# Patient Record
Sex: Male | Born: 1945
Health system: Southern US, Community
[De-identification: ages and names within clinical notes are randomized; demographics above are authoritative.]

## PROBLEM LIST (undated history)

## (undated) DIAGNOSIS — Z951 Presence of aortocoronary bypass graft: Secondary | ICD-10-CM

## (undated) DIAGNOSIS — G4733 Obstructive sleep apnea (adult) (pediatric): Secondary | ICD-10-CM

## (undated) DIAGNOSIS — I5020 Unspecified systolic (congestive) heart failure: Secondary | ICD-10-CM

## (undated) DIAGNOSIS — I2510 Atherosclerotic heart disease of native coronary artery without angina pectoris: Secondary | ICD-10-CM

## (undated) DIAGNOSIS — E785 Hyperlipidemia, unspecified: Secondary | ICD-10-CM

## (undated) DIAGNOSIS — E119 Type 2 diabetes mellitus without complications: Secondary | ICD-10-CM

## (undated) DIAGNOSIS — I10 Essential (primary) hypertension: Secondary | ICD-10-CM

## (undated) DIAGNOSIS — I491 Atrial premature depolarization: Secondary | ICD-10-CM

## (undated) DIAGNOSIS — Z87891 Personal history of nicotine dependence: Secondary | ICD-10-CM

## (undated) HISTORY — DX: Atherosclerotic heart disease of native coronary artery without angina pectoris: I25.10

## (undated) HISTORY — PX: CARDIAC CATHETERIZATION: SHX172

## (undated) HISTORY — DX: Unspecified systolic (congestive) heart failure: I50.20

## (undated) HISTORY — DX: Personal history of nicotine dependence: Z87.891

## (undated) HISTORY — PX: TONSILLECTOMY: SUR1361

## (undated) HISTORY — DX: Atrial premature depolarization: I49.1

## (undated) HISTORY — PX: VASECTOMY: SHX75

## (undated) HISTORY — DX: Presence of aortocoronary bypass graft: Z95.1

---

## 2017-11-19 DIAGNOSIS — R011 Cardiac murmur, unspecified: Secondary | ICD-10-CM | POA: Diagnosis not present

## 2017-11-19 DIAGNOSIS — Z23 Encounter for immunization: Secondary | ICD-10-CM | POA: Diagnosis not present

## 2017-11-19 DIAGNOSIS — I10 Essential (primary) hypertension: Secondary | ICD-10-CM | POA: Diagnosis not present

## 2017-11-19 DIAGNOSIS — M109 Gout, unspecified: Secondary | ICD-10-CM | POA: Diagnosis not present

## 2017-11-19 DIAGNOSIS — K219 Gastro-esophageal reflux disease without esophagitis: Secondary | ICD-10-CM | POA: Diagnosis not present

## 2017-12-10 DIAGNOSIS — G4733 Obstructive sleep apnea (adult) (pediatric): Secondary | ICD-10-CM | POA: Diagnosis not present

## 2018-01-10 DIAGNOSIS — R9431 Abnormal electrocardiogram [ECG] [EKG]: Secondary | ICD-10-CM | POA: Diagnosis not present

## 2018-01-10 DIAGNOSIS — I10 Essential (primary) hypertension: Secondary | ICD-10-CM | POA: Diagnosis not present

## 2018-01-10 DIAGNOSIS — R011 Cardiac murmur, unspecified: Secondary | ICD-10-CM | POA: Diagnosis not present

## 2018-01-10 DIAGNOSIS — E785 Hyperlipidemia, unspecified: Secondary | ICD-10-CM | POA: Diagnosis not present

## 2018-01-14 DIAGNOSIS — E782 Mixed hyperlipidemia: Secondary | ICD-10-CM | POA: Diagnosis not present

## 2018-01-14 DIAGNOSIS — I10 Essential (primary) hypertension: Secondary | ICD-10-CM | POA: Diagnosis not present

## 2018-01-14 DIAGNOSIS — R010 Benign and innocent cardiac murmurs: Secondary | ICD-10-CM | POA: Diagnosis not present

## 2018-01-14 DIAGNOSIS — R9431 Abnormal electrocardiogram [ECG] [EKG]: Secondary | ICD-10-CM | POA: Diagnosis not present

## 2018-01-14 DIAGNOSIS — I2510 Atherosclerotic heart disease of native coronary artery without angina pectoris: Secondary | ICD-10-CM | POA: Diagnosis not present

## 2018-01-14 DIAGNOSIS — R079 Chest pain, unspecified: Secondary | ICD-10-CM | POA: Diagnosis not present

## 2018-01-18 DIAGNOSIS — R079 Chest pain, unspecified: Secondary | ICD-10-CM | POA: Diagnosis not present

## 2018-01-18 DIAGNOSIS — I10 Essential (primary) hypertension: Secondary | ICD-10-CM | POA: Diagnosis not present

## 2018-01-18 DIAGNOSIS — R9431 Abnormal electrocardiogram [ECG] [EKG]: Secondary | ICD-10-CM | POA: Diagnosis not present

## 2018-01-18 DIAGNOSIS — R010 Benign and innocent cardiac murmurs: Secondary | ICD-10-CM | POA: Diagnosis not present

## 2018-01-25 DIAGNOSIS — R9431 Abnormal electrocardiogram [ECG] [EKG]: Secondary | ICD-10-CM | POA: Diagnosis not present

## 2018-01-28 DIAGNOSIS — I10 Essential (primary) hypertension: Secondary | ICD-10-CM | POA: Diagnosis not present

## 2018-01-28 DIAGNOSIS — I2510 Atherosclerotic heart disease of native coronary artery without angina pectoris: Secondary | ICD-10-CM | POA: Diagnosis not present

## 2018-01-28 DIAGNOSIS — R0602 Shortness of breath: Secondary | ICD-10-CM | POA: Diagnosis not present

## 2018-01-28 DIAGNOSIS — R079 Chest pain, unspecified: Secondary | ICD-10-CM | POA: Diagnosis not present

## 2018-03-31 DIAGNOSIS — I2510 Atherosclerotic heart disease of native coronary artery without angina pectoris: Secondary | ICD-10-CM | POA: Diagnosis not present

## 2018-03-31 DIAGNOSIS — I10 Essential (primary) hypertension: Secondary | ICD-10-CM | POA: Diagnosis not present

## 2018-03-31 DIAGNOSIS — E785 Hyperlipidemia, unspecified: Secondary | ICD-10-CM | POA: Diagnosis not present

## 2018-05-19 DIAGNOSIS — Z23 Encounter for immunization: Secondary | ICD-10-CM | POA: Diagnosis not present

## 2018-05-19 DIAGNOSIS — I10 Essential (primary) hypertension: Secondary | ICD-10-CM | POA: Diagnosis not present

## 2018-05-19 DIAGNOSIS — E785 Hyperlipidemia, unspecified: Secondary | ICD-10-CM | POA: Diagnosis not present

## 2018-05-19 DIAGNOSIS — K219 Gastro-esophageal reflux disease without esophagitis: Secondary | ICD-10-CM | POA: Diagnosis not present

## 2018-07-04 DIAGNOSIS — I209 Angina pectoris, unspecified: Secondary | ICD-10-CM | POA: Diagnosis not present

## 2018-07-04 DIAGNOSIS — I10 Essential (primary) hypertension: Secondary | ICD-10-CM | POA: Diagnosis not present

## 2018-08-22 DIAGNOSIS — I10 Essential (primary) hypertension: Secondary | ICD-10-CM | POA: Diagnosis not present

## 2018-08-22 DIAGNOSIS — Z1389 Encounter for screening for other disorder: Secondary | ICD-10-CM | POA: Diagnosis not present

## 2018-08-22 DIAGNOSIS — I259 Chronic ischemic heart disease, unspecified: Secondary | ICD-10-CM | POA: Diagnosis not present

## 2018-11-07 DIAGNOSIS — E785 Hyperlipidemia, unspecified: Secondary | ICD-10-CM | POA: Diagnosis not present

## 2018-11-07 DIAGNOSIS — I10 Essential (primary) hypertension: Secondary | ICD-10-CM | POA: Diagnosis not present

## 2018-11-07 DIAGNOSIS — M109 Gout, unspecified: Secondary | ICD-10-CM | POA: Diagnosis not present

## 2018-12-30 DIAGNOSIS — G4733 Obstructive sleep apnea (adult) (pediatric): Secondary | ICD-10-CM | POA: Diagnosis not present

## 2019-02-27 DIAGNOSIS — I10 Essential (primary) hypertension: Secondary | ICD-10-CM | POA: Diagnosis not present

## 2019-02-27 DIAGNOSIS — I259 Chronic ischemic heart disease, unspecified: Secondary | ICD-10-CM | POA: Diagnosis not present

## 2019-03-13 DIAGNOSIS — M545 Low back pain: Secondary | ICD-10-CM | POA: Diagnosis not present

## 2019-03-27 DIAGNOSIS — M48061 Spinal stenosis, lumbar region without neurogenic claudication: Secondary | ICD-10-CM | POA: Diagnosis not present

## 2019-03-27 DIAGNOSIS — M545 Low back pain: Secondary | ICD-10-CM | POA: Diagnosis not present

## 2019-05-05 DIAGNOSIS — I10 Essential (primary) hypertension: Secondary | ICD-10-CM | POA: Diagnosis not present

## 2019-05-05 DIAGNOSIS — I2510 Atherosclerotic heart disease of native coronary artery without angina pectoris: Secondary | ICD-10-CM | POA: Diagnosis not present

## 2019-05-05 DIAGNOSIS — R7303 Prediabetes: Secondary | ICD-10-CM | POA: Diagnosis not present

## 2019-05-05 DIAGNOSIS — E785 Hyperlipidemia, unspecified: Secondary | ICD-10-CM | POA: Diagnosis not present

## 2019-05-16 DIAGNOSIS — M160 Bilateral primary osteoarthritis of hip: Secondary | ICD-10-CM | POA: Diagnosis not present

## 2019-05-16 DIAGNOSIS — M109 Gout, unspecified: Secondary | ICD-10-CM | POA: Diagnosis not present

## 2019-05-16 DIAGNOSIS — Z23 Encounter for immunization: Secondary | ICD-10-CM | POA: Diagnosis not present

## 2019-05-16 DIAGNOSIS — M545 Low back pain: Secondary | ICD-10-CM | POA: Diagnosis not present

## 2019-05-16 DIAGNOSIS — I10 Essential (primary) hypertension: Secondary | ICD-10-CM | POA: Diagnosis not present

## 2019-06-24 DIAGNOSIS — Z20828 Contact with and (suspected) exposure to other viral communicable diseases: Secondary | ICD-10-CM | POA: Diagnosis not present

## 2019-06-27 DIAGNOSIS — H25813 Combined forms of age-related cataract, bilateral: Secondary | ICD-10-CM | POA: Diagnosis not present

## 2019-06-30 ENCOUNTER — Emergency Department: Payer: No Typology Code available for payment source

## 2019-06-30 ENCOUNTER — Other Ambulatory Visit: Payer: Self-pay

## 2019-06-30 ENCOUNTER — Telehealth: Payer: Self-pay | Admitting: Emergency Medicine

## 2019-06-30 ENCOUNTER — Encounter: Payer: Self-pay | Admitting: Emergency Medicine

## 2019-06-30 ENCOUNTER — Emergency Department
Admission: EM | Admit: 2019-06-30 | Discharge: 2019-06-30 | Disposition: A | Discharge status: Home / Self Care | Payer: No Typology Code available for payment source | Attending: Emergency Medicine | Admitting: Emergency Medicine

## 2019-06-30 DIAGNOSIS — J069 Acute upper respiratory infection, unspecified: Secondary | ICD-10-CM

## 2019-06-30 DIAGNOSIS — Z79899 Other long term (current) drug therapy: Secondary | ICD-10-CM | POA: Diagnosis not present

## 2019-06-30 DIAGNOSIS — R0789 Other chest pain: Secondary | ICD-10-CM

## 2019-06-30 DIAGNOSIS — U071 COVID-19: Secondary | ICD-10-CM | POA: Insufficient documentation

## 2019-06-30 DIAGNOSIS — R05 Cough: Secondary | ICD-10-CM | POA: Diagnosis not present

## 2019-06-30 LAB — SARS CORONAVIRUS 2 (TAT 6-24 HRS): SARS Coronavirus 2: POSITIVE — AB

## 2019-06-30 MED ORDER — BENZONATATE 100 MG PO CAPS: 200.0000 mg | ORAL_CAPSULE | Freq: Three times a day (TID) | ORAL | 0 refills | Status: DC | PRN

## 2019-06-30 MED ORDER — CYCLOBENZAPRINE HCL 5 MG PO TABS: 5.0000 mg | ORAL_TABLET | Freq: Three times a day (TID) | ORAL | 0 refills | Status: AC | PRN

## 2019-06-30 NOTE — ED Triage Notes (Signed)
Patient ambulatory to triage with steady gait, without difficulty or distress noted, mask in place; pt reports since Wed has had nonprod cough, low-grade temp (99) and bodyaches

## 2019-06-30 NOTE — ED Notes (Signed)
See triage note  Presents with  Some body aches with some nausea and dry heaves  States temp at home was 99.6  But is afebrile on arrival

## 2019-06-30 NOTE — ED Provider Notes (Signed)
[facility] Emergency Department Provider Note   ____________________________________________   First MD Initiated Contact with Patient 06/30/19 0715     (approximate)  I have reviewed the triage vital signs and the nursing notes.   HISTORY  Chief Complaint Cough    HPI Brian Chapman is a 73 y.o. male patient presents with nonproductive cough for 3 days.  Patient also state low-grade fever and body aches.  Patient denies sore throat, nasal congestion, or runny nose.  Patient state nausea but no vomiting.  Patient denies recent travel or known exposure to COVID-19.  Patient describes his pain as "achy".  No palliative measure for complaint.         History reviewed. No pertinent past medical history.  There are no active problems to display for this patient.   Past Surgical History:  Procedure Laterality Date  . TONSILLECTOMY    . VASECTOMY      Prior to Admission medications   Medication Sig Start Date End Date Taking? Authorizing Provider  allopurinol (ZYLOPRIM) 300 MG tablet Take 300 mg by mouth daily.   Yes [provider]  benzonatate (TESSALON PERLES) 100 MG capsule Take 2 capsules (200 mg total) by mouth 3 (three) times daily as needed. 06/30/19 06/29/20  Joni Reining, PA-C  cyclobenzaprine (FLEXERIL) 5 MG tablet Take 1 tablet (5 mg total) by mouth 3 (three) times daily as needed for muscle spasms. 06/30/19   Joni Reining, PA-C    Allergies Patient has no known allergies.  No family history on file.  Social History Social History   Tobacco Use  . Smoking status: Never Smoker  . Smokeless tobacco: Never Used  Substance Use Topics  . Alcohol use: Never    Frequency: Never  . Drug use: Not on file    Review of Systems Constitutional: Fever and body aches.   Eyes: No visual changes. ENT: No sore throat. Cardiovascular: Denies chest pain. Respiratory: Denies shortness of breath.  Nonproductive cough  Gastrointestinal: No abdominal pain.  Nausea without vomiting.  No diarrhea.  No constipation. Genitourinary: Negative for dysuria. Musculoskeletal: Negative for back pain. Skin: Negative for rash. Neurological: Negative for headaches, focal weakness or numbness.   ____________________________________________   PHYSICAL EXAM:  VITAL SIGNS: ED Triage Vitals  Enc Vitals Group     BP 06/30/19 0631 (!) 178/78     Pulse Rate 06/30/19 0631 100     Resp 06/30/19 0631 20     Temp 06/30/19 0631 98.6 F (37 C)     Temp Source 06/30/19 0631 Oral     SpO2 06/30/19 0631 95 %     Weight 06/30/19 0630 225 lb (102.1 kg)     Height 06/30/19 0630 5\' 6"  (1.676 m)     Head Circumference --      Peak Flow --      Pain Score 06/30/19 0630 5     Pain Loc --      Pain Edu? --      Excl. in GC? --    Constitutional: Alert and oriented. Well appearing and in no acute distress. Eyes: Conjunctivae are normal. PERRL. EOMI. Head: Atraumatic. Nose: No congestion/rhinnorhea. Mouth/Throat: Mucous membranes are moist.  Oropharynx non-erythematous. Neck: No stridor.  No cervical spine tenderness to palpation. Hematological/Lymphatic/Immunilogical: No cervical lymphadenopathy. Cardiovascular: Normal rate, regular rhythm. Grossly normal heart sounds.  Good peripheral circulation. Respiratory: Normal respiratory effort.  No retractions. Lungs CTAB. Musculoskeletal: No lower extremity tenderness nor edema.  No joint effusions. Neurologic:  Normal speech and language. No gross focal neurologic deficits are appreciated. No gait instability. Skin:  Skin is warm, dry and intact. No rash noted. Psychiatric: Mood and affect are normal. Speech and behavior are normal.  ____________________________________________   LABS (all labs ordered are listed, but only abnormal results are displayed)  Labs Reviewed  SARS CORONAVIRUS 2 (TAT 6-24 HRS)   ____________________________________________  EKG    ____________________________________________  RADIOLOGY  ED MD interpretation:    Official radiology report(s): Dg Chest Portable 1 View  Result Date: 06/30/2019 CLINICAL DATA:  Nonproductive cough, low-grade fever, and fatigue for several days. EXAM: PORTABLE CHEST 1 VIEW COMPARISON:  None. FINDINGS: The heart size and mediastinal contours are within normal limits. Ectasia of thoracic aorta noted. Both lungs are clear. The visualized skeletal structures are unremarkable. IMPRESSION: No active disease. Electronically Signed   By: Marlaine Hind M.D.   On: 06/30/2019 07:55    ____________________________________________   PROCEDURES  Procedure(s) performed (including Critical Care):  Procedures   ____________________________________________   INITIAL IMPRESSION / ASSESSMENT AND PLAN / ED COURSE  As part of my medical decision making, I reviewed the following data within the [place]     Patient presents with 3 days of cough, low-grade fever, and body aches.  Patient physical exam consistent with viral respiratory infection with cough.  Discussed negative chest x-ray findings with patient.  Patient given discharge care instruction advised take medication as directed.  Follow-up PCP.    Brian Chapman was evaluated in Emergency Department on 06/30/2019 for the symptoms described in the history of present illness. He was evaluated in the context of the global COVID-19 pandemic, which necessitated consideration that the patient might be at risk for infection with the SARS-CoV-2 virus that causes COVID-19. Institutional protocols and algorithms that pertain to the evaluation of patients at risk for COVID-19 are in a state of rapid change based on information released by regulatory bodies including the CDC and federal and state organizations. These policies and algorithms were followed during the patient's care in the ED.        ____________________________________________   FINAL CLINICAL IMPRESSION(S) / ED DIAGNOSES  Final diagnoses:  Viral URI with cough  Chest wall pain     ED Discharge Orders         Ordered    benzonatate (TESSALON PERLES) 100 MG capsule  3 times daily PRN     06/30/19 0806    cyclobenzaprine (FLEXERIL) 5 MG tablet  3 times daily PRN     06/30/19 1093           Note:  This document was prepared using Dragon voice recognition software and may include unintentional dictation errors.    Sable Feil, PA-C 06/30/19 2355    Lavonia Drafts, MD 06/30/19 [phone]

## 2019-07-27 DIAGNOSIS — G4733 Obstructive sleep apnea (adult) (pediatric): Secondary | ICD-10-CM | POA: Diagnosis not present

## 2019-08-15 DIAGNOSIS — G4733 Obstructive sleep apnea (adult) (pediatric): Secondary | ICD-10-CM | POA: Diagnosis not present

## 2019-08-27 DIAGNOSIS — G4733 Obstructive sleep apnea (adult) (pediatric): Secondary | ICD-10-CM | POA: Diagnosis not present

## 2019-08-31 DIAGNOSIS — M19031 Primary osteoarthritis, right wrist: Secondary | ICD-10-CM | POA: Diagnosis not present

## 2019-08-31 DIAGNOSIS — I2510 Atherosclerotic heart disease of native coronary artery without angina pectoris: Secondary | ICD-10-CM | POA: Diagnosis not present

## 2019-08-31 DIAGNOSIS — M79644 Pain in right finger(s): Secondary | ICD-10-CM | POA: Diagnosis not present

## 2019-08-31 DIAGNOSIS — M189 Osteoarthritis of first carpometacarpal joint, unspecified: Secondary | ICD-10-CM | POA: Diagnosis not present

## 2019-08-31 DIAGNOSIS — R7303 Prediabetes: Secondary | ICD-10-CM | POA: Diagnosis not present

## 2019-08-31 DIAGNOSIS — I10 Essential (primary) hypertension: Secondary | ICD-10-CM | POA: Diagnosis not present

## 2019-09-16 ENCOUNTER — Other Ambulatory Visit: Payer: Self-pay

## 2019-09-16 ENCOUNTER — Inpatient Hospital Stay (HOSPITAL_COMMUNITY): Payer: No Typology Code available for payment source

## 2019-09-16 ENCOUNTER — Encounter: Payer: Self-pay | Admitting: Intensive Care

## 2019-09-16 ENCOUNTER — Emergency Department: Payer: No Typology Code available for payment source

## 2019-09-16 ENCOUNTER — Inpatient Hospital Stay
Admission: EM | Admit: 2019-09-16 | Discharge: 2019-09-16 | DRG: 282 | Disposition: A | Discharge status: Other Acute Inpatient Hospital | Payer: No Typology Code available for payment source | Attending: Emergency Medicine | Admitting: Emergency Medicine

## 2019-09-16 ENCOUNTER — Encounter
Admission: EM | Disposition: A | Discharge status: Other Acute Inpatient Hospital | Payer: Self-pay | Source: Home / Self Care

## 2019-09-16 ENCOUNTER — Inpatient Hospital Stay (HOSPITAL_COMMUNITY)
Admission: AD | Admit: 2019-09-16 | Discharge: 2019-09-21 | DRG: 234 | Disposition: A | Discharge status: Home / Self Care | Payer: No Typology Code available for payment source | Source: Other Acute Inpatient Hospital | Attending: Cardiothoracic Surgery | Admitting: Cardiothoracic Surgery

## 2019-09-16 DIAGNOSIS — K760 Fatty (change of) liver, not elsewhere classified: Secondary | ICD-10-CM | POA: Diagnosis not present

## 2019-09-16 DIAGNOSIS — Z9852 Vasectomy status: Secondary | ICD-10-CM | POA: Diagnosis not present

## 2019-09-16 DIAGNOSIS — I2510 Atherosclerotic heart disease of native coronary artery without angina pectoris: Secondary | ICD-10-CM | POA: Diagnosis present

## 2019-09-16 DIAGNOSIS — Z452 Encounter for adjustment and management of vascular access device: Secondary | ICD-10-CM | POA: Diagnosis not present

## 2019-09-16 DIAGNOSIS — Z7984 Long term (current) use of oral hypoglycemic drugs: Secondary | ICD-10-CM

## 2019-09-16 DIAGNOSIS — Z20822 Contact with and (suspected) exposure to covid-19: Secondary | ICD-10-CM | POA: Diagnosis not present

## 2019-09-16 DIAGNOSIS — Z888 Allergy status to other drugs, medicaments and biological substances status: Secondary | ICD-10-CM | POA: Insufficient documentation

## 2019-09-16 DIAGNOSIS — I10 Essential (primary) hypertension: Secondary | ICD-10-CM | POA: Diagnosis present

## 2019-09-16 DIAGNOSIS — I213 ST elevation (STEMI) myocardial infarction of unspecified site: Secondary | ICD-10-CM

## 2019-09-16 DIAGNOSIS — E785 Hyperlipidemia, unspecified: Secondary | ICD-10-CM | POA: Diagnosis present

## 2019-09-16 DIAGNOSIS — E119 Type 2 diabetes mellitus without complications: Secondary | ICD-10-CM | POA: Diagnosis present

## 2019-09-16 DIAGNOSIS — Z6837 Body mass index (BMI) 37.0-37.9, adult: Secondary | ICD-10-CM

## 2019-09-16 DIAGNOSIS — Z87891 Personal history of nicotine dependence: Secondary | ICD-10-CM | POA: Diagnosis not present

## 2019-09-16 DIAGNOSIS — I4891 Unspecified atrial fibrillation: Secondary | ICD-10-CM | POA: Diagnosis not present

## 2019-09-16 DIAGNOSIS — Z951 Presence of aortocoronary bypass graft: Secondary | ICD-10-CM

## 2019-09-16 DIAGNOSIS — Z9889 Other specified postprocedural states: Secondary | ICD-10-CM

## 2019-09-16 DIAGNOSIS — I2102 ST elevation (STEMI) myocardial infarction involving left anterior descending coronary artery: Principal | ICD-10-CM | POA: Diagnosis present

## 2019-09-16 DIAGNOSIS — I700 Atherosclerosis of aorta: Secondary | ICD-10-CM | POA: Diagnosis not present

## 2019-09-16 DIAGNOSIS — Z01810 Encounter for preprocedural cardiovascular examination: Secondary | ICD-10-CM | POA: Diagnosis not present

## 2019-09-16 DIAGNOSIS — Z7982 Long term (current) use of aspirin: Secondary | ICD-10-CM

## 2019-09-16 DIAGNOSIS — Z4682 Encounter for fitting and adjustment of non-vascular catheter: Secondary | ICD-10-CM | POA: Diagnosis not present

## 2019-09-16 DIAGNOSIS — Z79899 Other long term (current) drug therapy: Secondary | ICD-10-CM

## 2019-09-16 DIAGNOSIS — J9811 Atelectasis: Secondary | ICD-10-CM | POA: Diagnosis not present

## 2019-09-16 DIAGNOSIS — I219 Acute myocardial infarction, unspecified: Secondary | ICD-10-CM | POA: Diagnosis present

## 2019-09-16 DIAGNOSIS — I25110 Atherosclerotic heart disease of native coronary artery with unstable angina pectoris: Secondary | ICD-10-CM | POA: Diagnosis not present

## 2019-09-16 DIAGNOSIS — R918 Other nonspecific abnormal finding of lung field: Secondary | ICD-10-CM | POA: Diagnosis not present

## 2019-09-16 HISTORY — DX: Hyperlipidemia, unspecified: E78.5

## 2019-09-16 HISTORY — DX: Obstructive sleep apnea (adult) (pediatric): G47.33

## 2019-09-16 HISTORY — PX: CORONARY/GRAFT ACUTE MI REVASCULARIZATION: CATH118305

## 2019-09-16 HISTORY — DX: Type 2 diabetes mellitus without complications: E11.9

## 2019-09-16 HISTORY — PX: LEFT HEART CATH AND CORONARY ANGIOGRAPHY: CATH118249

## 2019-09-16 HISTORY — DX: Essential (primary) hypertension: I10

## 2019-09-16 LAB — APTT: aPTT: 33 seconds (ref 24–36)

## 2019-09-16 LAB — ABO/RH: ABO/RH(D): O NEG

## 2019-09-16 LAB — CBC
HCT: 48.5 % (ref 39.0–52.0)
Hemoglobin: 15.8 g/dL (ref 13.0–17.0)
MCH: 29.3 pg (ref 26.0–34.0)
MCHC: 32.6 g/dL (ref 30.0–36.0)
MCV: 90 fL (ref 80.0–100.0)
Platelets: 252 10*3/uL (ref 150–400)
RBC: 5.39 MIL/uL (ref 4.22–5.81)
RDW: 14.2 % (ref 11.5–15.5)
WBC: 10.4 10*3/uL (ref 4.0–10.5)
nRBC: 0 % (ref 0.0–0.2)

## 2019-09-16 LAB — TROPONIN I (HIGH SENSITIVITY): Troponin I (High Sensitivity): 16 ng/L (ref <18)

## 2019-09-16 LAB — HEPARIN LEVEL (UNFRACTIONATED): Heparin Unfractionated: 0.46 IU/mL (ref 0.30–0.70)

## 2019-09-16 LAB — RESPIRATORY PANEL BY RT PCR (FLU A&B, COVID)
Influenza A by PCR: NEGATIVE
Influenza B by PCR: NEGATIVE
SARS Coronavirus 2 by RT PCR: NEGATIVE

## 2019-09-16 LAB — BASIC METABOLIC PANEL
Anion gap: 10 (ref 5–15)
BUN: 24 mg/dL — ABNORMAL HIGH (ref 8–23)
CO2: 24 mmol/L (ref 22–32)
Calcium: 9 mg/dL (ref 8.9–10.3)
Chloride: 105 mmol/L (ref 98–111)
Creatinine, Ser: 1.03 mg/dL (ref 0.61–1.24)
GFR calc Af Amer: >60 mL/min (ref >60)
GFR calc non Af Amer: >60 mL/min (ref >60)
Glucose, Bld: 140 mg/dL — ABNORMAL HIGH (ref 70–99)
Potassium: 4.3 mmol/L (ref 3.5–5.1)
Sodium: 139 mmol/L (ref 135–145)

## 2019-09-16 LAB — ECHOCARDIOGRAM COMPLETE
Height: 66 in
Weight: 3287.5 oz

## 2019-09-16 LAB — GLUCOSE, CAPILLARY: Glucose-Capillary: 132 mg/dL — ABNORMAL HIGH (ref 70–99)

## 2019-09-16 LAB — POCT ACTIVATED CLOTTING TIME: Activated Clotting Time: 312 seconds

## 2019-09-16 LAB — PROTIME-INR
INR: 1 (ref 0.8–1.2)
Prothrombin Time: 13.2 seconds (ref 11.4–15.2)

## 2019-09-16 LAB — MRSA PCR SCREENING: MRSA by PCR: NEGATIVE

## 2019-09-16 LAB — TYPE AND SCREEN
ABO/RH(D): O NEG
Antibody Screen: NEGATIVE

## 2019-09-16 SURGERY — CORONARY/GRAFT ACUTE MI REVASCULARIZATION
Anesthesia: Moderate Sedation

## 2019-09-16 MED ORDER — PERFLUTREN LIPID MICROSPHERE
INTRAVENOUS | Status: AC
  Filled 2019-09-16: qty 10

## 2019-09-16 MED ORDER — HEPARIN BOLUS VIA INFUSION
4000.0000 [IU] | Freq: Once | INTRAVENOUS | Status: AC
  Administered 2019-09-16: 4000 [IU] via INTRAVENOUS
  Filled 2019-09-16: qty 4000

## 2019-09-16 MED ORDER — ONDANSETRON HCL 4 MG/2ML IJ SOLN
4.0000 mg | Freq: Four times a day (QID) | INTRAMUSCULAR | Status: DC | PRN
  Administered 2019-09-18 – 2019-09-19 (×3): 4 mg via INTRAVENOUS
  Filled 2019-09-16 (×3): qty 2

## 2019-09-16 MED ORDER — MAGNESIUM SULFATE 50 % IJ SOLN
40.0000 meq | INTRAMUSCULAR | Status: DC
  Filled 2019-09-16: qty 9.85

## 2019-09-16 MED ORDER — NOREPINEPHRINE 4 MG/250ML-% IV SOLN
0.0000 ug/min | INTRAVENOUS | Status: DC
  Filled 2019-09-16: qty 250

## 2019-09-16 MED ORDER — FAMOTIDINE IN NACL 20-0.9 MG/50ML-% IV SOLN
20.0000 mg | Freq: Two times a day (BID) | INTRAVENOUS | Status: DC
  Administered 2019-09-16: 21:00:00 20 mg via INTRAVENOUS
  Filled 2019-09-16: qty 50

## 2019-09-16 MED ORDER — CHLORHEXIDINE GLUCONATE CLOTH 2 % EX PADS
6.0000 | MEDICATED_PAD | Freq: Once | CUTANEOUS | Status: AC
  Administered 2019-09-16: 20:00:00 6 via TOPICAL

## 2019-09-16 MED ORDER — CHLORHEXIDINE GLUCONATE CLOTH 2 % EX PADS
6.0000 | MEDICATED_PAD | Freq: Once | CUTANEOUS | Status: AC
  Administered 2019-09-17: 06:00:00 6 via TOPICAL

## 2019-09-16 MED ORDER — MIDAZOLAM HCL 2 MG/2ML IJ SOLN
INTRAMUSCULAR | Status: DC | PRN
  Administered 2019-09-16 (×2): 1 mg via INTRAVENOUS

## 2019-09-16 MED ORDER — TRANEXAMIC ACID (OHS) PUMP PRIME SOLUTION
2.0000 mg/kg | INTRAVENOUS | Status: DC
  Filled 2019-09-16: qty 1.86

## 2019-09-16 MED ORDER — SODIUM CHLORIDE 0.9 % IV SOLN
INTRAVENOUS | Status: DC
  Filled 2019-09-16: qty 30

## 2019-09-16 MED ORDER — VANCOMYCIN HCL 1500 MG/300ML IV SOLN
1500.0000 mg | INTRAVENOUS | Status: DC
  Filled 2019-09-16: qty 300

## 2019-09-16 MED ORDER — DEXMEDETOMIDINE HCL IN NACL 400 MCG/100ML IV SOLN
0.1000 ug/kg/h | INTRAVENOUS | Status: AC
  Administered 2019-09-17: .3 ug/kg/h via INTRAVENOUS
  Filled 2019-09-16: qty 100

## 2019-09-16 MED ORDER — PERFLUTREN LIPID MICROSPHERE
1.0000 mL | INTRAVENOUS | Status: AC | PRN
  Administered 2019-09-16: 16:00:00 3 mL via INTRAVENOUS
  Filled 2019-09-16: qty 10

## 2019-09-16 MED ORDER — ASPIRIN 81 MG PO CHEW
324.0000 mg | CHEWABLE_TABLET | Freq: Once | ORAL | Status: AC
  Administered 2019-09-16: 324 mg via ORAL

## 2019-09-16 MED ORDER — CHLORHEXIDINE GLUCONATE CLOTH 2 % EX PADS: 6.0000 | MEDICATED_PAD | Freq: Once | CUTANEOUS | Status: DC

## 2019-09-16 MED ORDER — VERAPAMIL HCL 2.5 MG/ML IV SOLN
INTRAVENOUS | Status: AC
  Filled 2019-09-16: qty 2

## 2019-09-16 MED ORDER — HEPARIN (PORCINE) 25000 UT/250ML-% IV SOLN
INTRAVENOUS | Status: AC | PRN
  Administered 2019-09-16: 1000 [IU]/h via INTRAVENOUS

## 2019-09-16 MED ORDER — TEMAZEPAM 15 MG PO CAPS: 15.0000 mg | ORAL_CAPSULE | Freq: Once | ORAL | Status: DC | PRN

## 2019-09-16 MED ORDER — IPRATROPIUM-ALBUTEROL 0.5-2.5 (3) MG/3ML IN SOLN: 3.0000 mL | RESPIRATORY_TRACT | Status: DC | PRN

## 2019-09-16 MED ORDER — MILRINONE LACTATE IN DEXTROSE 20-5 MG/100ML-% IV SOLN
0.3000 ug/kg/min | INTRAVENOUS | Status: DC
  Filled 2019-09-16: qty 100

## 2019-09-16 MED ORDER — NITROGLYCERIN 1 MG/10 ML FOR IR/CATH LAB
INTRA_ARTERIAL | Status: AC
  Filled 2019-09-16: qty 10

## 2019-09-16 MED ORDER — VANCOMYCIN HCL 1000 MG IV SOLR
INTRAVENOUS | Status: DC
  Filled 2019-09-16: qty 1000

## 2019-09-16 MED ORDER — HEPARIN SODIUM (PORCINE) 1000 UNIT/ML IJ SOLN
INTRAMUSCULAR | Status: AC
  Filled 2019-09-16: qty 1

## 2019-09-16 MED ORDER — TRANEXAMIC ACID (OHS) BOLUS VIA INFUSION
15.0000 mg/kg | INTRAVENOUS | Status: AC
  Administered 2019-09-17: 1398 mg via INTRAVENOUS
  Filled 2019-09-16: qty 1398

## 2019-09-16 MED ORDER — POTASSIUM CHLORIDE 2 MEQ/ML IV SOLN
80.0000 meq | INTRAVENOUS | Status: DC
  Filled 2019-09-16: qty 40

## 2019-09-16 MED ORDER — EPINEPHRINE PF 1 MG/ML IJ SOLN
0.0000 ug/min | INTRAVENOUS | Status: DC
  Filled 2019-09-16: qty 4

## 2019-09-16 MED ORDER — NITROGLYCERIN IN D5W 200-5 MCG/ML-% IV SOLN
INTRAVENOUS | Status: AC
  Administered 2019-09-16: 5 ug/min via INTRAVENOUS
  Filled 2019-09-16: qty 250

## 2019-09-16 MED ORDER — CHLORHEXIDINE GLUCONATE 0.12 % MT SOLN
15.0000 mL | Freq: Once | OROMUCOSAL | Status: DC
  Filled 2019-09-16: qty 15

## 2019-09-16 MED ORDER — MIDAZOLAM HCL 2 MG/2ML IJ SOLN
INTRAMUSCULAR | Status: AC
  Filled 2019-09-16: qty 2

## 2019-09-16 MED ORDER — NITROGLYCERIN IN D5W 200-5 MCG/ML-% IV SOLN
0.0000 ug/min | INTRAVENOUS | Status: DC
  Administered 2019-09-16: 14:00:00 5 ug/min via INTRAVENOUS

## 2019-09-16 MED ORDER — HEPARIN (PORCINE) 25000 UT/250ML-% IV SOLN
1150.0000 [IU]/h | INTRAVENOUS | Status: DC
  Administered 2019-09-16: 1000 [IU]/h via INTRAVENOUS

## 2019-09-16 MED ORDER — VERAPAMIL HCL 2.5 MG/ML IV SOLN
INTRAVENOUS | Status: DC | PRN
  Administered 2019-09-16: 2.5 mg via INTRA_ARTERIAL

## 2019-09-16 MED ORDER — INSULIN REGULAR(HUMAN) IN NACL 100-0.9 UT/100ML-% IV SOLN
INTRAVENOUS | Status: AC
  Administered 2019-09-17: 1 [IU]/h via INTRAVENOUS
  Filled 2019-09-16: qty 100

## 2019-09-16 MED ORDER — LORAZEPAM 0.5 MG PO TABS: 0.5000 mg | ORAL_TABLET | ORAL | Status: DC | PRN

## 2019-09-16 MED ORDER — METOPROLOL TARTRATE 12.5 MG HALF TABLET
12.5000 mg | ORAL_TABLET | Freq: Once | ORAL | Status: AC
  Administered 2019-09-17: 12.5 mg via ORAL
  Filled 2019-09-16: qty 1

## 2019-09-16 MED ORDER — TRANEXAMIC ACID (OHS) BOLUS VIA INFUSION
15.0000 mg/kg | INTRAVENOUS | Status: DC
  Filled 2019-09-16: qty 1398

## 2019-09-16 MED ORDER — BISACODYL 5 MG PO TBEC
5.0000 mg | DELAYED_RELEASE_TABLET | Freq: Once | ORAL | Status: AC
  Administered 2019-09-16: 20:00:00 5 mg via ORAL
  Filled 2019-09-16: qty 1

## 2019-09-16 MED ORDER — MILRINONE LACTATE IN DEXTROSE 20-5 MG/100ML-% IV SOLN: 0.3000 ug/kg/min | INTRAVENOUS | Status: DC

## 2019-09-16 MED ORDER — NITROGLYCERIN IN D5W 200-5 MCG/ML-% IV SOLN: 2.0000 ug/min | INTRAVENOUS | Status: DC

## 2019-09-16 MED ORDER — PLASMA-LYTE 148 IV SOLN
INTRAVENOUS | Status: DC
  Filled 2019-09-16: qty 2.5

## 2019-09-16 MED ORDER — NITROGLYCERIN IN D5W 200-5 MCG/ML-% IV SOLN
2.0000 ug/min | INTRAVENOUS | Status: DC
  Filled 2019-09-16: qty 250

## 2019-09-16 MED ORDER — VANCOMYCIN HCL 1500 MG/300ML IV SOLN
1500.0000 mg | INTRAVENOUS | Status: AC
  Administered 2019-09-17: 1500 mg via INTRAVENOUS
  Filled 2019-09-16: qty 300

## 2019-09-16 MED ORDER — HEPARIN SODIUM (PORCINE) 1000 UNIT/ML IJ SOLN
INTRAMUSCULAR | Status: DC | PRN
  Administered 2019-09-16: 3000 [IU] via INTRAVENOUS
  Administered 2019-09-16: 7000 [IU] via INTRAVENOUS

## 2019-09-16 MED ORDER — ACETAMINOPHEN 325 MG PO TABS: 650.0000 mg | ORAL_TABLET | ORAL | Status: DC | PRN

## 2019-09-16 MED ORDER — PHENYLEPHRINE HCL-NACL 20-0.9 MG/250ML-% IV SOLN
30.0000 ug/min | INTRAVENOUS | Status: AC
  Administered 2019-09-17: 10 ug/min via INTRAVENOUS
  Filled 2019-09-16: qty 250

## 2019-09-16 MED ORDER — NOREPINEPHRINE 4 MG/250ML-% IV SOLN: 0.0000 ug/min | INTRAVENOUS | Status: DC

## 2019-09-16 MED ORDER — HEPARIN (PORCINE) 25000 UT/250ML-% IV SOLN
INTRAVENOUS | Status: AC
  Filled 2019-09-16: qty 250

## 2019-09-16 MED ORDER — NITROGLYCERIN 1 MG/10 ML FOR IR/CATH LAB
INTRA_ARTERIAL | Status: DC | PRN
  Administered 2019-09-16: 100 ug via INTRACORONARY

## 2019-09-16 MED ORDER — SODIUM CHLORIDE 0.9 % IV SOLN
1.5000 g | INTRAVENOUS | Status: AC
  Administered 2019-09-17: 1.5 g via INTRAVENOUS
  Filled 2019-09-16: qty 1.5

## 2019-09-16 MED ORDER — IOHEXOL 300 MG/ML  SOLN
INTRAMUSCULAR | Status: DC | PRN
  Administered 2019-09-16: 215 mL

## 2019-09-16 MED ORDER — NITROGLYCERIN 0.4 MG SL SUBL
0.4000 mg | SUBLINGUAL_TABLET | SUBLINGUAL | Status: DC | PRN
  Administered 2019-09-16: 0.4 mg via SUBLINGUAL

## 2019-09-16 MED ORDER — TRANEXAMIC ACID 1000 MG/10ML IV SOLN
1.5000 mg/kg/h | INTRAVENOUS | Status: AC
  Administered 2019-09-17: 1.5 mg/kg/h via INTRAVENOUS
  Filled 2019-09-16: qty 25

## 2019-09-16 MED ORDER — THIAMINE HCL 100 MG/ML IJ SOLN
Freq: Once | INTRAVENOUS | Status: AC
  Filled 2019-09-16: qty 1000

## 2019-09-16 MED ORDER — TRANEXAMIC ACID 1000 MG/10ML IV SOLN
1.5000 mg/kg/h | INTRAVENOUS | Status: DC
  Filled 2019-09-16: qty 25

## 2019-09-16 MED ORDER — DEXMEDETOMIDINE HCL IN NACL 400 MCG/100ML IV SOLN: 0.1000 ug/kg/h | INTRAVENOUS | Status: DC

## 2019-09-16 MED ORDER — HEPARIN (PORCINE) IN NACL 2000-0.9 UNIT/L-% IV SOLN
INTRAVENOUS | Status: DC | PRN
  Administered 2019-09-16: 1000 mL

## 2019-09-16 MED ORDER — HEPARIN (PORCINE) 25000 UT/250ML-% IV SOLN
900.0000 [IU]/h | INTRAVENOUS | Status: DC
  Administered 2019-09-16: 14:00:00 1150 [IU]/h via INTRAVENOUS
  Administered 2019-09-17: 03:00:00 950 [IU]/h via INTRAVENOUS
  Filled 2019-09-16: qty 250

## 2019-09-16 MED ORDER — FENTANYL CITRATE (PF) 100 MCG/2ML IJ SOLN
INTRAMUSCULAR | Status: DC | PRN
  Administered 2019-09-16: 50 ug via INTRAVENOUS
  Administered 2019-09-16: 25 ug via INTRAVENOUS

## 2019-09-16 MED ORDER — SODIUM CHLORIDE 0.9 % IV SOLN
750.0000 mg | INTRAVENOUS | Status: DC
  Filled 2019-09-16: qty 750

## 2019-09-16 MED ORDER — SODIUM CHLORIDE 0.9 % IV SOLN
1.5000 g | INTRAVENOUS | Status: DC
  Filled 2019-09-16: qty 1.5

## 2019-09-16 MED ORDER — SODIUM CHLORIDE 0.9 % IV SOLN
250.0000 mL | INTRAVENOUS | Status: DC
  Administered 2019-09-16: 250 mL via INTRAVENOUS

## 2019-09-16 MED ORDER — PHENYLEPHRINE HCL-NACL 20-0.9 MG/250ML-% IV SOLN: 30.0000 ug/min | INTRAVENOUS | Status: DC

## 2019-09-16 MED ORDER — SODIUM CHLORIDE 0.9 % IV SOLN
750.0000 mg | INTRAVENOUS | Status: AC
  Administered 2019-09-17: .75 g via INTRAVENOUS
  Filled 2019-09-16: qty 750

## 2019-09-16 MED ORDER — INSULIN REGULAR(HUMAN) IN NACL 100-0.9 UT/100ML-% IV SOLN: INTRAVENOUS | Status: DC

## 2019-09-16 MED ORDER — FENTANYL CITRATE (PF) 100 MCG/2ML IJ SOLN
INTRAMUSCULAR | Status: AC
  Filled 2019-09-16: qty 2

## 2019-09-16 SURGICAL SUPPLY — 18 items
BALLN IABP SENSA PLUS 8F 50CC (BALLOONS) ×3
BALLN MINITREK RX 2.0X15 (BALLOONS) ×3
BALLOON IABP SENS PLUS 8F 50CC (BALLOONS) ×1 IMPLANT
BALLOON MINITREK RX 2.0X15 (BALLOONS) ×1 IMPLANT
CANNULA 5F STIFF (CANNULA) ×3 IMPLANT
CATH INFINITI 5FR ANG PIGTAIL (CATHETERS) ×3 IMPLANT
CATH INFINITI JR4 5F (CATHETERS) ×3 IMPLANT
CATH LAUNCHER 6FR EBU3.5 (CATHETERS) ×3 IMPLANT
DEVICE INFLAT 30 PLUS (MISCELLANEOUS) ×3 IMPLANT
DEVICE RAD COMP TR BAND LRG (VASCULAR PRODUCTS) ×3 IMPLANT
GLIDESHEATH SLEND SS 6F .021 (SHEATH) ×3 IMPLANT
KIT MANI 3VAL PERCEP (MISCELLANEOUS) ×3 IMPLANT
KIT TRANSPAC II SGL 4260605 (MISCELLANEOUS) ×3 IMPLANT
PACK CARDIAC CATH (CUSTOM PROCEDURE TRAY) ×3 IMPLANT
SUT SILK 0 FSL (SUTURE) ×3 IMPLANT
WIRE HITORQ VERSACORE ST 145CM (WIRE) ×3 IMPLANT
WIRE ROSEN-J .035X260CM (WIRE) ×3 IMPLANT
WIRE RUNTHROUGH .014X180CM (WIRE) ×3 IMPLANT

## 2019-09-16 NOTE — ED Notes (Addendum)
Pt states that he knows he has a blockage in his heart that he is followed by the [place] for. Denies any stent placement yet. Pt states his mother passed away from "massive heart attack." pt states the pressure in his chest has released and now it just feels like a discomfort. Takes a baby asa at night, takes BP meds in the morning. States hasn't missed any meds. A&O, speaking in complete sentences. Doesn't appear as anxious as when pt first arrived.

## 2019-09-16 NOTE — Consult Note (Signed)
ANTICOAGULATION CONSULT NOTE - Initial Consult  Pharmacy Consult for heparin drip Indication: chest pain/ACS/STEMI  Allergies  Allergen Reactions  . Lisinopril Cough    Patient Measurements: Height: 5\' 6"  (167.6 cm) Weight: 225 lb (102.1 kg) IBW/kg (Calculated) : 63.8 Heparin Dosing Weight: 86.4kg  Vital Signs: Temp: 97.5 F (36.4 C) (02/06 0841) Temp Source: Oral (02/06 0841) BP: 137/94 (02/06 0856) Pulse Rate: 81 (02/06 0856)  Labs: No results for input(s): HGB, HCT, PLT, APTT, LABPROT, INR, HEPARINUNFRC, HEPRLOWMOCWT, CREATININE, CKTOTAL, CKMB, TROPONINIHS in the last 72 hours.  CrCl cannot be calculated (No successful lab value found.).   Medical History: Past Medical History:  Diagnosis Date  . Diabetes mellitus without complication (HCC)   . Hyperlipidemia   . Hypertension     Medications:  No PTA anticoagulant of record  Assessment: Pharmacy has been consulted to initiate and monitor heparin drip for ACS/STEMI in this 74 yo male with chespt pain.  Goal of Therapy:  Heparin level 0.3-0.7 units/ml Monitor platelets by anticoagulation protocol: Yes   Plan:  Give 4000 units bolus x 1, followed by 1050 units/hour  Will obtain baseline INR, APTT, and CBC labs.  Will f/u heparin level (HL) in 8 hours per protocol.   65, PharmD, BCPS Clinical Pharmacist 09/16/2019 9:06 AM

## 2019-09-16 NOTE — ED Notes (Signed)
Called [facility] activated [facility]  [phone]

## 2019-09-16 NOTE — ED Notes (Signed)
Pt clothes removed and pt placed in gown.  Pt placed on zoll by Lorin Picket EDT

## 2019-09-16 NOTE — ED Notes (Signed)
Cardiology at bedside.

## 2019-09-16 NOTE — ED Provider Notes (Signed)
[facility] Emergency Department Provider Note ____________________________________________   First MD Initiated Contact with Patient 09/16/19 [phone]     (approximate)  I have reviewed the triage vital signs and the nursing notes.   HISTORY  Chief Complaint Chest Pain    HPI Brian Chapman is a 74 y.o. male with PMH as noted below as well as a history of CAD diagnosed by stress test and echocardiogram (patient has no prior history of MI, stents, or catheterization) who presents with chest pain, acute onset 1 hour ago described as pressure or something sitting on his chest, and associated with some lightheadedness.  He denies nausea or vomiting and has no shortness of breath.  He has had no cough or fever.  He states he has had a few intermittent episodes of chest pain, but nothing like this.  Past Medical History:  Diagnosis Date  . Diabetes mellitus without complication (HCC)   . Hyperlipidemia   . Hypertension     There are no problems to display for this patient.   Past Surgical History:  Procedure Laterality Date  . TONSILLECTOMY    . VASECTOMY      Prior to Admission medications   Medication Sig Start Date End Date Taking? Authorizing Provider  allopurinol (ZYLOPRIM) 300 MG tablet Take 300 mg by mouth daily.    [provider]  benzonatate (TESSALON PERLES) 100 MG capsule Take 2 capsules (200 mg total) by mouth 3 (three) times daily as needed. 06/30/19 06/29/20  Joni Reining, PA-C  cyclobenzaprine (FLEXERIL) 5 MG tablet Take 1 tablet (5 mg total) by mouth 3 (three) times daily as needed for muscle spasms. 06/30/19   Joni Reining, PA-C    Allergies Lisinopril  History reviewed. No pertinent family history.  Social History Social History   Tobacco Use  . Smoking status: Former Smoker    Types: Cigarettes  . Smokeless tobacco: Never Used  Substance Use Topics  . Alcohol use: Never  . Drug use: Never    Review of  Systems  Constitutional: No fever. Eyes: No redness. ENT: No neck pain. Cardiovascular: Positive for chest pain. Respiratory: Denies shortness of breath. Gastrointestinal: No vomiting or diarrhea.  Genitourinary: Negative for flank pain.  Musculoskeletal: Negative for back pain. Skin: Negative for rash. Neurological: Negative for headache.   ____________________________________________   PHYSICAL EXAM:  VITAL SIGNS: ED Triage Vitals [09/16/19 0841]  Enc Vitals Group     BP (!) 179/87     Pulse Rate 76     Resp (!) 22     Temp (!) 97.5 F (36.4 C)     Temp Source Oral     SpO2 100 %     Weight 225 lb (102.1 kg)     Height 5\' 6"  (1.676 m)     Head Circumference      Peak Flow      Pain Score 8     Pain Loc      Pain Edu?      Excl. in GC?     Constitutional: Alert and oriented.  Relatively well appearing and in no acute distress. Eyes: Conjunctivae are normal.  Head: Atraumatic. Nose: No congestion/rhinnorhea. Mouth/Throat: Mucous membranes are moist.   Neck: Normal range of motion.  Cardiovascular: Normal rate, regular rhythm. Grossly normal heart sounds.  Good peripheral circulation. Respiratory: Normal respiratory effort.  No retractions. Lungs CTAB. Gastrointestinal: Soft and nontender. No distention.  Genitourinary: No flank tenderness. Musculoskeletal: No lower  extremity edema.  Extremities warm and well perfused.  Neurologic:  Normal speech and language. No gross focal neurologic deficits are appreciated.  Skin:  Skin is warm and dry. No rash noted. Psychiatric: Mood and affect are normal. Speech and behavior are normal.  ____________________________________________   LABS (all labs ordered are listed, but only abnormal results are displayed)  Labs Reviewed  RESPIRATORY PANEL BY RT PCR (FLU A&B, COVID)  CBC  BASIC METABOLIC PANEL  APTT  PROTIME-INR  HEPARIN LEVEL (UNFRACTIONATED)  TROPONIN I (HIGH SENSITIVITY)    ____________________________________________  EKG  ED ECG REPORT I, Dionne Bucy, the attending physician, personally viewed and interpreted this ECG.  Date: 09/16/2019 EKG Time: 08 47 Rate: 77 Rhythm: normal sinus rhythm QRS Axis: normal Intervals: normal ST/T Wave abnormalities: ST elevation anterior and inferior Narrative Interpretation: ST elevations concerning for acute MI  ____________________________________________  RADIOLOGY  CXR: No focal infiltrate or other acute abnormality  ____________________________________________   PROCEDURES  Procedure(s) performed: No  Procedures  Critical Care performed: Yes  CRITICAL CARE Performed by: Dionne Bucy   Total critical care time: 35 minutes  Critical care time was exclusive of separately billable procedures and treating other patients.  Critical care was necessary to treat or prevent imminent or life-threatening deterioration.  Critical care was time spent personally by me on the following activities: development of treatment plan with patient and/or surrogate as well as nursing, discussions with consultants, evaluation of patient's response to treatment, examination of patient, obtaining history from patient or surrogate, ordering and performing treatments and interventions, ordering and review of laboratory studies, ordering and review of radiographic studies, pulse oximetry and re-evaluation of patient's condition. ____________________________________________   INITIAL IMPRESSION / ASSESSMENT AND PLAN / ED COURSE  Pertinent labs & imaging results that were available during my care of the patient were reviewed by me and considered in my medical decision making (see chart for details).  74 year old male with PMH as noted above including a prior history of CAD but no prior MI or stenting who presents with acute onset of pressure-like chest pain about 1 hour ago.  The patient states he took 1 nitro  and reports that the pain is now starting to subside.  He takes a baby aspirin nightly and did take it last night.  I reviewed the available past medical records, however the patient follows at the [place] and has no prior records in [place].  On exam, the patient is overall well-appearing and his vital signs are normal except for hypertension initially.  The physical exam is otherwise unremarkable.  EKG is concerning for STEMI.  I immediately activated the STEMI team and discussed the case with Dr. Kirke Corin who will come to evaluate the patient.  The patient has received aspirin, additional nitroglycerin, and is being started on a heparin infusion at this time.  ----------------------------------------- 9:25 AM on 09/16/2019 -----------------------------------------  Patient taken to the Cath Lab by Dr. Kirke Corin. ___________________________  Brian Chapman was evaluated in Emergency Department on 09/16/2019 for the symptoms described in the history of present illness. He was evaluated in the context of the global COVID-19 pandemic, which necessitated consideration that the patient might be at risk for infection with the SARS-CoV-2 virus that causes COVID-19. Institutional protocols and algorithms that pertain to the evaluation of patients at risk for COVID-19 are in a state of rapid change based on information released by regulatory bodies including the CDC and federal and state organizations. These policies and algorithms were followed during  the patient's care in the ED.  ____________________________________________   FINAL CLINICAL IMPRESSION(S) / ED DIAGNOSES  Final diagnoses:  ST elevation myocardial infarction (STEMI), unspecified artery ([place])      NEW MEDICATIONS STARTED DURING THIS VISIT:  Current Discharge Medication List       Note:  This document was prepared using Dragon voice recognition software and may include unintentional dictation errors.    Arta Silence,  MD 09/16/19 [phone]

## 2019-09-16 NOTE — H&P (Signed)
[address]       [place] [address]             [phone]        Brian Chapman [place] Medical Record #[id number] Date of Birth: 05-11-1946  Referring: No ref. provider found Primary Care: [facility] Primary Cardiologist:No primary care provider on file.  Chief Complaint:   No chief complaint on file. Chest pain x 1 day  History of Present Illness:     74 yo man in [place] until this am when he was driving to work and began to experience severe mid-sternal cP. This persisted despite NTG SL. He arrived at work and the pain persisted and was associated with weakness, nausea, and SOB. He then drove himself to ED at [facility]. There, he had evidence for STEMI. Taken to cath lab where severe LM CAD diagnosed. IAbP placed and txferred to [facility]. Hemodynamically stable. Now CP free without c/o.    Current Activity/ Functional Status: Patient will be independent with mobility/ambulation, transfers, ADL's, IADL's.   Zubrod Score: At the time of surgery this patient's most appropriate activity status/level should be described as:     0    Normal activity, no symptoms     1    Restricted in physical strenuous activity but ambulatory, able to do out light work     2    Ambulatory and capable of self care, unable to do work activities, up and about                 more than 50%  Of the time                                3    Only limited self care, in bed greater than 50% of waking hours     4    Completely disabled, no self care, confined to bed or chair     5    Moribund  Past Medical History:  Diagnosis Date  . Diabetes mellitus without complication (HCC)   . Hyperlipidemia   . Hypertension     Past Surgical History:  Procedure Laterality Date  . TONSILLECTOMY    . VASECTOMY      Social History   Tobacco Use  Smoking Status Former Smoker  . Types: Cigarettes  Smokeless Tobacco Never Used    Social History   Substance and  Sexual Activity  Alcohol Use Never     Allergies  Allergen Reactions  . Lisinopril Cough    Current Facility-Administered Medications  Medication Dose Route Frequency Provider Last Rate Last Admin  . 0.9 %  sodium chloride infusion  250 mL Intravenous Continuous Linden Dolin, MD   Stopped at 09/16/19 1525  . acetaminophen (TYLENOL) tablet 650 mg  650 mg Oral Q4H PRN Linden Dolin, MD      . [name] ON 09/17/2019] cefUROXime (ZINACEF) 1.5 g in sodium chloride 0.9 % 100 mL IVPB  1.5 g Intravenous To OR Linden Dolin, MD      . [name] ON 09/17/2019] cefUROXime (ZINACEF) 750 mg in sodium chloride 0.9 % 100 mL IVPB  750 mg Intravenous To OR Lashia Niese, Merri Brunette, MD      . [name] ON 09/17/2019] dexmedetomidine (PRECEDEX) 400 MCG/100ML (4 mcg/mL) infusion  0.1-0.7 mcg/kg/hr Intravenous To OR [name], Merri Brunette, MD      . [  START ON 09/17/2019] EPINEPHrine (ADRENALIN) 4 mg in dextrose 5 % 250 mL (0.016 mg/mL) infusion  0-10 mcg/min Intravenous To OR [name], Merri Brunette, MD      . famotidine (PEPCID) IVPB 20 mg premix  20 mg Intravenous Q12H Linden Dolin, MD      . [name] ON 09/17/2019] heparin 2,500 Units, papaverine 30 mg in electrolyte-148 (PLASMALYTE-148) 500 mL irrigation   Irrigation To OR Smokey Melott, Merri Brunette, MD      . [name] ON 09/17/2019] heparin 30,000 units/NS 1000 mL solution for CELLSAVER   Other To OR [name], MD      . heparin ADULT infusion 100 units/mL (25000 units/243mL sodium chloride 0.45%)  950 Units/hr Intravenous Continuous Danae Orleans L, RPH 9.5 mL/hr at 09/16/19 1554 950 Units/hr at 09/16/19 1554  . [START ON 09/17/2019] insulin regular, human (MYXREDLIN) 100 units/ 100 mL infusion   Intravenous To OR [name], MD      . ipratropium-albuterol (DUONEB) 0.5-2.5 (3) MG/3ML nebulizer solution 3 mL  3 mL Nebulization Q4H PRN Linden Dolin, MD      . [name] ON 09/17/2019] magnesium sulfate (IV Push/IM) injection 40 mEq  40 mEq Other To OR [name], Merri Brunette, MD       . [name] ON 09/17/2019] milrinone (PRIMACOR) 20 MG/100 ML (0.2 mg/mL) infusion  0.3 mcg/kg/min Intravenous To OR [name], MD      . nitroGLYCERIN 50 mg in dextrose 5 % 250 mL (0.2 mg/mL) infusion  0-200 mcg/min Intravenous Titrated [name], MD 1.5 mL/hr at 09/16/19 1600 5 mcg/min at 09/16/19 1600  . [START ON 09/17/2019] nitroGLYCERIN 50 mg in dextrose 5 % 250 mL (0.2 mg/mL) infusion  2-200 mcg/min Intravenous To OR [name], Merri Brunette, MD      . [name] ON 09/17/2019] norepinephrine (LEVOPHED) 4mg  in premix infusion  0-40 mcg/min Intravenous To OR [name], , MD      . ondansetron (ZOFRAN) injection 4 mg  4 mg Intravenous Q6H PRN [name], MD      . perflutren lipid microspheres (DEFINITY) IV suspension  1-10 mL Intravenous PRN Merri Brunette, MD   3 mL at 09/16/19 1557  . [START ON 09/17/2019] phenylephrine (NEOSYNEPHRINE) 20-0.9 MG/250ML-% infusion  30-200 mcg/min Intravenous To OR [name], 11/15/2019, MD      . Merri Brunette ON 09/17/2019] potassium chloride injection 80 mEq  80 mEq Other To OR [name], 11/15/2019, MD      . Merri Brunette ON 09/17/2019] tranexamic acid (CYKLOKAPRON) 2,500 mg in sodium chloride 0.9 % 250 mL (10 mg/mL) infusion  1.5 mg/kg/hr Intravenous To OR [name], 11/15/2019, MD      . Merri Brunette ON 09/17/2019] tranexamic acid (CYKLOKAPRON) bolus via infusion - over 30 minutes 1,398 mg  15 mg/kg Intravenous To OR [name], 11/15/2019, MD      . Merri Brunette ON 09/17/2019] tranexamic acid (CYKLOKAPRON) pump prime solution 186 mg  2 mg/kg Intracatheter To OR [name], 11/15/2019, MD      . Merri Brunette ON 09/17/2019] vancomycin (VANCOCIN) 1,000 mg in sodium chloride 0.9 % 1,000 mL irrigation   Irrigation To OR Freeda Spivey, 11/15/2019, MD      . Merri Brunette ON 09/17/2019] vancomycin (VANCOREADY) IVPB 1500 mg/300 mL  1,500 mg Intravenous To OR [name], 11/15/2019, MD        Medications Prior to Admission  Medication Sig Dispense Refill Last Dose  . allopurinol (ZYLOPRIM) 300 MG tablet Take 300 mg by mouth  daily.     . aspirin EC 81 MG tablet Take 81 mg by mouth daily.     Marland Kitchen atorvastatin (LIPITOR) 80 MG tablet Take 80 mg by mouth daily.     . benzonatate (TESSALON PERLES) 100 MG capsule Take 2 capsules (200 mg total) by mouth 3 (three) times daily as needed. 30 capsule 0   . betamethasone valerate lotion (VALISONE) 0.1 % Apply 1 application topically 2 (two) times daily.     . cyclobenzaprine (FLEXERIL) 5 MG tablet Take 1 tablet (5 mg total) by mouth 3 (three) times daily as needed for muscle spasms. 15 tablet 0   . desonide (DESOWEN) 0.05 % cream Apply 1 application topically 2 (two) times daily.     . diclofenac Sodium (VOLTAREN) 1 % GEL Apply 4 g topically 4 (four) times daily.     Marland Kitchen ketoconazole (NIZORAL) 2 % shampoo Apply 1 application topically 2 (two) times a week.     . metFORMIN (GLUCOPHAGE) 500 MG tablet Take 500 mg by mouth daily with breakfast.     . metoprolol tartrate (LOPRESSOR) 25 MG tablet Take 12.5 mg by mouth 2 (two) times daily.     . valsartan (DIOVAN) 320 MG tablet Take 320 mg by mouth daily.       No family history on file.   Review of Systems:   ROS A comprehensive review of systems was negative.     Cardiac Review of Systems: Y or  [    ]= no  Chest Pain [    ]  Resting SOB [   ] Exertional SOB  [  ]  Orthopnea [  ]   Pedal Edema [   ]    Palpitations [  ] Syncope  [  ]   Presyncope [   ]  General Review of Systems: [Y] = yes [  ]=no Constitional: recent weight change [  ]; anorexia [  ]; fatigue [  ]; nausea [  ]; night sweats [  ]; fever [  ]; or chills [  ]                                                               Dental: Last Dentist visit:   Eye : blurred vision [  ]; diplopia [   ]; vision changes [  ];  Amaurosis fugax[  ]; Resp: cough [  ];  wheezing[  ];  hemoptysis[  ]; shortness of breath[  ]; paroxysmal nocturnal dyspnea[  ]; dyspnea on exertion[  ]; or orthopnea[  ];  GI:  gallstones[  ], vomiting[  ];  dysphagia[  ]; melena[  ];  hematochezia [   ]; heartburn[  ];   Hx of  Colonoscopy[  ]; GU: kidney stones [  ]; hematuria[  ];   dysuria [  ];  nocturia[  ];  history of     obstruction [  ]; urinary frequency [  ]             Skin: rash, swelling[  ];, hair loss[  ];  peripheral edema[  ];  or itching[  ]; Musculosketetal: myalgias[  ];  joint swelling[  ];  joint erythema[  ];  joint pain[  ];  back pain[  ];  Heme/Lymph: bruising[  ];  bleeding[  ];  anemia[  ];  Neuro: TIA[  ];  headaches[  ];  stroke[  ];  vertigo[  ];  seizures[  ];   paresthesias[  ];  difficulty walking[  ];  Psych:depression[  ]; anxiety[  ];  Endocrine: diabetes[  ];  thyroid dysfunction[  ];             Physical Exam: BP 100/67 (BP Location: Left Arm)   Pulse 64   Temp 98.4 F (36.9 C) (Oral)   Resp (!) 26   SpO2 97%    General appearance: alert, cooperative and mildly obese Head: Normocephalic, without obvious abnormality, atraumatic Neck: no adenopathy, no carotid bruit, no JVD, supple, symmetrical, trachea midline and thyroid not enlarged, symmetric, no tenderness/mass/nodules Resp: clear to auscultation bilaterally Cardio: regular rate and rhythm, S1, S2 normal, no murmur, click, rub or gallop GI: soft, non-tender; bowel sounds normal; no masses,  no organomegaly Extremities: extremities normal, atraumatic, no cyanosis or edema Neurologic: Alert and oriented X 3, normal strength and tone. Normal symmetric reflexes. Normal coordination and gait  Diagnostic Studies & Laboratory data:     Recent Radiology Findings:   CARDIAC CATHETERIZATION  Result Date: 09/16/2019  Prox RCA lesion is 40% stenosed.  Dist LAD lesion is 100% stenosed.  Post intervention, there is a 10% residual stenosis.  Balloon angioplasty was performed using a BALLOON MINITREK RX 2.0X15.  Mid LAD lesion is 70% stenosed.  Prox LAD lesion is 85% stenosed.  Lat 1st Diag lesion is 80% stenosed.  Dist LM to Ost LAD lesion is 90% stenosed.  Prox Cx to Mid Cx lesion is 30%  stenosed.  The left ventricular systolic function is normal.  LV end diastolic pressure is moderately elevated.  1.  Severe distal left main stenosis with plaque rupture extending into the ostial LAD with occluded distal LAD likely due to embolization.  Mild to moderate proximal RCA stenosis.  Surgical targets include mid LAD, first diagonal and OM 3. 2.  Normal LV systolic function and moderately elevated left ventricular end-diastolic pressure. 3.  Successful balloon angioplasty to the distal LAD to establish TIMI-3 flow.  Cardiac catheterization was performed via the right radial artery. 4.  Successful intra-aortic balloon pump placement via the right common femoral artery. Recommendations: Given this the left main involvement, diffuse LAD disease and diabetic status, I think surgical revascularization with urgent/emergent CABG is indicated. The patient did not receive any oral antiplatelet medication other than aspirin. Continue unfractionated heparin. I discussed the case with Dr. [name].   DG Chest Port 1 View  Result Date: 09/16/2019 CLINICAL DATA:  ST-elevation myocardial infarction EXAM: PORTABLE CHEST 1 VIEW COMPARISON:  Earlier today FINDINGS: New aortic balloon pump with marker at the arch. Stable cardiopericardial size. There is extensive artifact from defibrillator pads and EKG leads. There is no edema, consolidation, effusion, or pneumothorax. IMPRESSION: 1. New aortic balloon pump in unremarkable position. 2. Improved vascular congestion. Electronically Signed   By: Marnee Spring M.D.   On: 09/16/2019 14:20   DG Chest Portable 1 View  Result Date: 09/16/2019 CLINICAL DATA:  Myocardial infarction EXAM: PORTABLE CHEST 1 VIEW COMPARISON:  06/30/2019 FINDINGS: UPPER limits normal heart size and pulmonary vascular congestion noted. There is no evidence of focal airspace disease, pulmonary edema, suspicious pulmonary nodule/mass, pleural effusion, or pneumothorax. No acute bony abnormalities are  identified. IMPRESSION: UPPER limits normal heart size with pulmonary vascular congestion. Electronically Signed   By: Tinnie Gens  Hu M.D.   On: 09/16/2019 09:38   ECHOCARDIOGRAM COMPLETE  Result Date: 09/16/2019   ECHOCARDIOGRAM REPORT   Patient Name:   Brian Chapman Date of Exam: 09/16/2019 Medical Rec #:  [id number]           Height:       66.0 in Accession #:    [id number]          Weight:       205.5 lb Date of Birth:  10-Mar-1946           BSA:          2.02 m Patient Age:    73 years            BP:           115/95 mmHg Patient Gender: M                   HR:           56 bpm. Exam Location:  Inpatient Procedure: 2D Echo and Intracardiac Opacification Agent Indications:    STEMI I21.3  History:        Patient has no prior history of Echocardiogram examinations.                 CAD; Risk Factors:Hypertension, Diabetes and Dyslipidemia.  Sonographer:    Ross Ludwig RDCS (AE) Referring Phys: [id number] [facility] Z Krew Hortman  Sonographer Comments: Patient is morbidly obese. Image acquisition challenging due to patient body habitus. IMPRESSIONS  1. Left ventricular ejection fraction, by visual estimation, is 45 to 50%. The left ventricle has hyperdynamic function. There is severely increased left ventricular hypertrophy.  2. Definity contrast agent was given IV to delineate the left ventricular endocardial borders.  3. The left ventricle demonstrates regional wall motion abnormalities.  4. Severe akinesis of the left ventricular, apical apical segment and inferoapical and distal anteroseptal/anteroapical walls. No apical thrombus.  5. Left ventricular diastolic parameters are consistent with Grade I diastolic dysfunction (impaired relaxation).  6. Global right ventricle has mildly reduced systolic function.The right ventricular size is normal. No increase in right ventricular wall thickness.  7. Hypokinetic right ventricular apex.  8. Left atrial size was normal.  9. Right atrial size was normal. 10. Mild mitral annular  calcification. 11. The mitral valve is grossly normal. Trivial mitral valve regurgitation. 12. The tricuspid valve is grossly normal. 13. The tricuspid valve is grossly normal. Tricuspid valve regurgitation is trivial. 14. The aortic valve is tricuspid. Aortic valve regurgitation is not visualized. Mild aortic valve sclerosis without stenosis. 15. The pulmonic valve was grossly normal. Pulmonic valve regurgitation is not visualized. 16. The interatrial septum was not well visualized. FINDINGS  Left Ventricle: Left ventricular ejection fraction, by visual estimation, is 45 to 50%. The left ventricle has hyperdynamic function. Severe akinesis of the left ventricular, apical apical segment and inferoapical and distal anteroseptal/anteroapical walls. Definity contrast agent was given IV to delineate the left ventricular endocardial borders. The left ventricle demonstrates regional wall motion abnormalities. There is severely increased left ventricular hypertrophy. Left ventricular diastolic parameters are consistent with Grade I diastolic dysfunction (impaired relaxation). Indeterminate filling pressures. Right Ventricle: The right ventricular size is normal. No increase in right ventricular wall thickness. Global RV systolic function is has mildly reduced systolic function. The motion of the right ventricle apex hypokinetic. Left Atrium: Left atrial size was normal in size. Right Atrium: Right atrial size was normal in size Pericardium: There is no evidence of  pericardial effusion. Mitral Valve: The mitral valve is grossly normal. Mild mitral annular calcification. Trivial mitral valve regurgitation. MV peak gradient, 3.9 mmHg. Tricuspid Valve: The tricuspid valve is grossly normal. Tricuspid valve regurgitation is trivial. Aortic Valve: The aortic valve is tricuspid. Aortic valve regurgitation is not visualized. Mild aortic valve sclerosis is present, with no evidence of aortic valve stenosis. Aortic valve mean  gradient measures 6.0 mmHg. Aortic valve peak gradient measures 11.3 mmHg. Aortic valve area, by VTI measures 2.87 cm. Pulmonic Valve: The pulmonic valve was grossly normal. Pulmonic valve regurgitation is not visualized. Pulmonic regurgitation is not visualized. Aorta: The aortic root and ascending aorta are structurally normal, with no evidence of dilitation. Venous: The inferior vena cava was not well visualized. IAS/Shunts: The interatrial septum was not well visualized.  LEFT VENTRICLE PLAX 2D LVIDd:         3.70 cm  Diastology LVIDs:         2.20 cm  LV e' lateral:   7.07 cm/s LV PW:         1.60 cm  LV E/e' lateral: 8.4 LV IVS:        1.80 cm  LV e' medial:    5.33 cm/s LVOT diam:     2.00 cm  LV E/e' medial:  11.2 LV SV:         42 ml LV SV Index:   19.80 LVOT Area:     3.14 cm  RIGHT VENTRICLE RV Basal diam:  3.00 cm RV S prime:     16.80 cm/s TAPSE (M-mode): 2.8 cm LEFT ATRIUM             Index       RIGHT ATRIUM           Index LA diam:        2.30 cm 1.14 cm/m  RA Area:     13.40 cm LA Vol (A2C):   53.7 ml 26.55 ml/m RA Volume:   28.30 ml  13.99 ml/m LA Vol (A4C):   64.5 ml 31.89 ml/m LA Biplane Vol: 63.9 ml 31.59 ml/m  AORTIC VALVE AV Area (Vmax):    2.60 cm AV Area (Vmean):   2.65 cm AV Area (VTI):     2.87 cm AV Vmax:           168.00 cm/s AV Vmean:          113.000 cm/s AV VTI:            0.341 m AV Peak Grad:      11.3 mmHg AV Mean Grad:      6.0 mmHg LVOT Vmax:         139.00 cm/s LVOT Vmean:        95.300 cm/s LVOT VTI:          0.311 m LVOT/AV VTI ratio: 0.91  AORTA Ao Root diam: 3.30 cm Ao Asc diam:  3.50 cm MITRAL VALVE MV Area (PHT): 3.03 cm             SHUNTS MV Peak grad:  3.9 mmHg             Systemic VTI:  0.31 m MV Mean grad:  1.0 mmHg             Systemic Diam: 2.00 cm MV Vmax:       0.99 m/s MV Vmean:      52.5 cm/s MV VTI:        0.34 m MV  PHT:        72.50 msec MV Decel Time: 250 msec MV E velocity: 59.70 cm/s 103 cm/s MV A velocity: 81.00 cm/s 70.3 cm/s MV E/A ratio:   0.74       1.5  Lyman Bishop MD Electronically signed by Lyman Bishop MD Signature Date/Time: 09/16/2019/4:19:40 PM    Final      I have independently reviewed the above radiologic studies and discussed with the patient   Recent Lab Findings: Lab Results  Component Value Date   WBC 10.4 09/16/2019   HGB 15.8 09/16/2019   HCT 48.5 09/16/2019   PLT 252 09/16/2019   GLUCOSE 140 (H) 09/16/2019   NA 139 09/16/2019   K 4.3 09/16/2019   CL 105 09/16/2019   CREATININE 1.03 09/16/2019   BUN 24 (H) 09/16/2019   CO2 24 09/16/2019   INR 1.0 09/16/2019      Assessment / Plan:       74 yo man with severe LM CAD. Plan CABG on 09/17/19.   I  spent 40 minutes counseling the patient face to face.   Randee Huston Z. Orvan Seen, [place] 09/16/2019 4:51 PM

## 2019-09-16 NOTE — Progress Notes (Signed)
  Echocardiogram 2D Echocardiogram has been performed with Definity  Gerda Diss 09/16/2019, 4:02 PM

## 2019-09-16 NOTE — Plan of Care (Signed)

## 2019-09-16 NOTE — ED Notes (Signed)
[address], [place], activated [facility]  [phone]

## 2019-09-16 NOTE — Consult Note (Signed)
Cardiology Consultation:   Patient ID: Brian Chapman MRN: [id number]; DOB: 15-Aug-1945  Admit date: 09/16/2019 Date of Consult: 09/16/2019  Primary Care Provider: [facility], [facility] Primary Cardiologist: new ( seen by Dr. Kirke Corin)  Primary Electrophysiologist:  None    Patient Profile:   Brian Chapman is a 74 y.o. male with a hx of reported mild nonobstructive coronary artery disease, type 2 diabetes, hypertension, hyperlipidemia and obesity who is being seen today for the evaluation of chest pain and abnormal EKG at the request of Dr. Marisa Severin.  History of Present Illness:   Brian Chapman is a 74 year old male who mostly follows at the [place].  He was told about mild coronary artery disease based on stress test but does not know the details.  No previous cardiac catheterization.  He has multiple risk factors including type 2 diabetes, hypertension, hyperlipidemia and obesity. He works as Engineer, site at [employer] and was going to work today when he all of a sudden started having severe substernal chest tightness and heaviness with shortness of breath.  Onset was 1 hour before presentation.  He felt slightly dizzy but no nausea or vomiting.  He never had any similar symptoms.  He came to the ED and was given nitroglycerin and unfractionated heparin with some improvement in chest pain.  Initial EKG showed possible hyperacute T waves in the anterior leads that was confirmed with subsequent EKG with 1 to 2 mm of ST elevation starting from V3 to V5.  Later on the monitor he was noted also to have inferior ST elevation. Given his symptoms and EKG finding, I have recommended proceeding with emergent cardiac catheterization and possible PCI. Of interest, the patient did have COVID-19 infection in November and was sick for about 10 days but did not require hospitalization.  He had no residual symptoms related to that.  Heart Pathway Score:     Past Medical History:  Diagnosis Date  .  Diabetes mellitus without complication (HCC)   . Hyperlipidemia   . Hypertension     Past Surgical History:  Procedure Laterality Date  . TONSILLECTOMY    . VASECTOMY       Home Medications:  Prior to Admission medications   Medication Sig Start Date End Date Taking? Authorizing Provider  allopurinol (ZYLOPRIM) 300 MG tablet Take 300 mg by mouth daily.   Yes [provider]  aspirin EC 81 MG tablet Take 81 mg by mouth daily.   Yes [provider]  atorvastatin (LIPITOR) 80 MG tablet Take 80 mg by mouth daily.   Yes [provider]  betamethasone valerate lotion (VALISONE) 0.1 % Apply 1 application topically 2 (two) times daily.   Yes [provider]  desonide (DESOWEN) 0.05 % cream Apply 1 application topically 2 (two) times daily.   Yes [provider]  diclofenac Sodium (VOLTAREN) 1 % GEL Apply 4 g topically 4 (four) times daily.   Yes [provider]  ketoconazole (NIZORAL) 2 % shampoo Apply 1 application topically 2 (two) times a week.   Yes [provider]  metFORMIN (GLUCOPHAGE) 500 MG tablet Take 500 mg by mouth daily with breakfast.   Yes [provider]  metoprolol tartrate (LOPRESSOR) 25 MG tablet Take 12.5 mg by mouth 2 (two) times daily.   Yes [provider]  valsartan (DIOVAN) 320 MG tablet Take 320 mg by mouth daily.   Yes [provider]  benzonatate (TESSALON PERLES) 100 MG capsule Take 2 capsules (200  mg total) by mouth 3 (three) times daily as needed. 06/30/19 06/29/20  Joni Reining, PA-C  cyclobenzaprine (FLEXERIL) 5 MG tablet Take 1 tablet (5 mg total) by mouth 3 (three) times daily as needed for muscle spasms. 06/30/19   Joni Reining, PA-C    Inpatient Medications: Scheduled Meds:  Continuous Infusions: . heparin 1,150 Units/hr (09/16/19 0907)  . heparin 1,000 Units/hr (09/16/19 1034)   PRN Meds: fentaNYL, Heparin (Porcine) in NaCl, heparin, heparin, iohexol,  midazolam, [MAR Hold] nitroGLYCERIN, nitroGLYCERIN, verapamil  Allergies:    Allergies  Allergen Reactions  . Lisinopril Cough    Social History:   Social History   Socioeconomic History  . Marital status: Married    Spouse name: Not on file  . Number of children: Not on file  . Years of education: Not on file  . Highest education level: Not on file  Occupational History  . Not on file  Tobacco Use  . Smoking status: Former Smoker    Types: Cigarettes  . Smokeless tobacco: Never Used  Substance and Sexual Activity  . Alcohol use: Never  . Drug use: Never  . Sexual activity: Not on file  Other Topics Concern  . Not on file  Social History Narrative  . Not on file   Social Determinants of Health   Financial Resource Strain:   . Difficulty of Paying Living Expenses: Not on file  Food Insecurity:   . Worried About Programme researcher, broadcasting/film/video in the Last Year: Not on file  . Ran Out of Food in the Last Year: Not on file  Transportation Needs:   . Lack of Transportation (Medical): Not on file  . Lack of Transportation (Non-Medical): Not on file  Physical Activity:   . Days of Exercise per Week: Not on file  . Minutes of Exercise per Session: Not on file  Stress:   . Feeling of Stress : Not on file  Social Connections:   . Frequency of Communication with Friends and Family: Not on file  . Frequency of Social Gatherings with Friends and Family: Not on file  . Attends Religious Services: Not on file  . Active Member of Clubs or Organizations: Not on file  . Attends Banker Meetings: Not on file  . Marital Status: Not on file  Intimate Partner Violence:   . Fear of Current or Ex-Partner: Not on file  . Emotionally Abused: Not on file  . Physically Abused: Not on file  . Sexually Abused: Not on file    Family History:   The patient's mother died in her 58s from massive myocardial infarction.  ROS:  Please see the history of present illness.   All other ROS  reviewed and negative.     Physical Exam/Data:   Vitals:   09/16/19 0909 09/16/19 0933 09/16/19 1037 09/16/19 1104  BP: 129/79  131/71 (!) 94/51  Pulse: 75     Resp: (!) 23   20  Temp:    (!) 97.5 F (36.4 C)  TempSrc:      SpO2: 92% 98%  100%  Weight:      Height:       No intake or output data in the 24 hours ending 09/16/19 1126 Last 3 Weights 09/16/2019 06/30/2019  Weight (lbs) 225 lb 225 lb  Weight (kg) 102.059 kg 102.059 kg     Body mass index is 36.32 kg/m.  General:  Well nourished, well developed, in no acute distress HEENT: normal Lymph:  no adenopathy Neck: no JVD Endocrine:  No thryomegaly Vascular: No carotid bruits; FA pulses 2+ bilaterally without bruits  Cardiac:  normal S1, S2; RRR; no murmur  Lungs:  clear to auscultation bilaterally, no wheezing, rhonchi or rales  Abd: soft, nontender, no hepatomegaly  Ext: no edema Musculoskeletal:  No deformities, BUE and BLE strength normal and equal Skin: warm and dry  Neuro:  CNs 2-12 intact, no focal abnormalities noted Psych:  Normal affect   EKG:  The EKG was personally reviewed and demonstrates: Normal sinus rhythm with 1 to 2 mm of ST elevation starting in V3 to V5 and later development of inferior ST elevation.   Relevant CV Studies:  Emergent cardiac catheterization done:  1.  Severe distal left main stenosis with plaque rupture extending into the ostial LAD with occluded distal LAD likely due to embolization.  Mild to moderate proximal RCA stenosis.  Surgical targets include mid LAD, first diagonal and OM 3. 2.  Normal LV systolic function and moderately elevated left ventricular end-diastolic pressure. 3.  Successful balloon angioplasty to the distal LAD to establish TIMI-3 flow.  Cardiac catheterization was performed via the right radial artery. 4.  Successful intra-aortic balloon pump placement via the right common femoral artery.  Recommendations: Given this the left main involvement, diffuse LAD  disease and diabetic status, I think surgical revascularization with urgent/emergent CABG is indicated. The patient did not receive any oral antiplatelet medication other than aspirin. Continue unfractionated heparin. I discussed the case with Dr. Orvan Seen.    Laboratory Data:  High Sensitivity Troponin:   Recent Labs  Lab 09/16/19 0852  TROPONINIHS 16     Chemistry Recent Labs  Lab 09/16/19 0852  NA 139  K 4.3  CL 105  CO2 24  GLUCOSE 140*  BUN 24*  CREATININE 1.03  CALCIUM 9.0  GFRNONAA >60  GFRAA >60  ANIONGAP 10    No results for input(s): PROT, ALBUMIN, AST, ALT, ALKPHOS, BILITOT in the last 168 hours. Hematology Recent Labs  Lab 09/16/19 0852  WBC 10.4  RBC 5.39  HGB 15.8  HCT 48.5  MCV 90.0  MCH 29.3  MCHC 32.6  RDW 14.2  PLT 252   BNPNo results for input(s): BNP, PROBNP in the last 168 hours.  DDimer No results for input(s): DDIMER in the last 168 hours.   Radiology/Studies:  CARDIAC CATHETERIZATION  Result Date: 09/16/2019  Prox RCA lesion is 40% stenosed.  Dist LAD lesion is 100% stenosed.  Post intervention, there is a 10% residual stenosis.  Balloon angioplasty was performed using a BALLOON MINITREK RX 2.0X15.  Mid LAD lesion is 70% stenosed.  Prox LAD lesion is 85% stenosed.  Lat 1st Diag lesion is 80% stenosed.  Dist LM to Ost LAD lesion is 90% stenosed.  Prox Cx to Mid Cx lesion is 30% stenosed.  The left ventricular systolic function is normal.  LV end diastolic pressure is moderately elevated.  1.  Severe distal left main stenosis with plaque rupture extending into the ostial LAD with occluded distal LAD likely due to embolization.  Mild to moderate proximal RCA stenosis.  Surgical targets include mid LAD, first diagonal and OM 3. 2.  Normal LV systolic function and moderately elevated left ventricular end-diastolic pressure. 3.  Successful balloon angioplasty to the distal LAD to establish TIMI-3 flow.  Cardiac catheterization was  performed via the right radial artery. 4.  Successful intra-aortic balloon pump placement via the right common femoral artery. Recommendations: Given this the left  main involvement, diffuse LAD disease and diabetic status, I think surgical revascularization with urgent/emergent CABG is indicated. The patient did not receive any oral antiplatelet medication other than aspirin. Continue unfractionated heparin. I discussed the case with Dr. [name].   DG Chest Portable 1 View  Result Date: 09/16/2019 CLINICAL DATA:  Myocardial infarction EXAM: PORTABLE CHEST 1 VIEW COMPARISON:  06/30/2019 FINDINGS: UPPER limits normal heart size and pulmonary vascular congestion noted. There is no evidence of focal airspace disease, pulmonary edema, suspicious pulmonary nodule/mass, pleural effusion, or pneumothorax. No acute bony abnormalities are identified. IMPRESSION: UPPER limits normal heart size with pulmonary vascular congestion. Electronically Signed   By: Harmon Pier M.D.   On: 09/16/2019 09:38    TIMI Risk Score for ST  Elevation MI:   The patient's TIMI risk score is 5, which indicates a 12.4% risk of all cause mortality at 30 days.    Assessment and Plan:   1. Anterior ST elevation myocardial infarction with inferior ST elevation as well: Emergent cardiac catheterization was done via the right radial artery which showed occluded distal LAD which wrapped around the apex.  This explained the inferior changes as well.  In addition, we found severe distal left main stenosis with plaque rupture extending into the ostial LAD.  The distal LAD occlusion was likely due to embolization from the distal left main plaque rupture.  I performed balloon angioplasty to the distal LAD with establishment of TIMI-3 flow.  The whole LAD is diffusely diseased and I felt that the best option would be to revascularize via urgent/emergent CABG.  Fortunately, ejection fraction was normal with minimal mitral regurgitation and no evidence  of aortic stenosis.  The patient did not receive any oral antiplatelet medication other than aspirin.  He was given heparin throughout the case.  I placed an intra-aortic balloon pump via the right common femoral artery.  Heparin drip was started.  The right radial sheath was removed and a TR band was applied.  I discussed the case with Dr. [name] at [facility] who accepted the patient for transfer.  The patient was hemodynamically stable before discharge with resolution of chest pain and normal blood pressure and heart rate without any pressors. 2. Essential hypertension: Blood pressure was within the normal range throughout the case.. 3. Hyperlipidemia: Recommend high-dose atorvastatin or rosuvastatin. 4. I updated the patient's wife via phone.   For questions or updates, please contact [facility] Please consult [email or web address] for contact info under     Signed, Lorine Bears, MD  09/16/2019 11:26 AM

## 2019-09-16 NOTE — Progress Notes (Signed)
ANTICOAGULATION CONSULT NOTE   Pharmacy Consult for heparin Indication: STEMI, IABP  Allergies  Allergen Reactions  . Lisinopril Cough    Patient Measurements: Height: 5\' 6"  (167.6 cm) Weight: 205 lb 7.5 oz (93.2 kg) IBW/kg (Calculated) : 63.8 Heparin Dosing Weight: 83.8 kg  Vital Signs: Temp: 98.5 F (36.9 C) (02/06 2330) Temp Source: Oral (02/06 2330) BP: 145/79 (02/06 2300) Pulse Rate: 63 (02/06 2330)  Labs: Recent Labs    09/16/19 0852 09/16/19 2256  HGB 15.8  --   HCT 48.5  --   PLT 252  --   APTT 33  --   LABPROT 13.2  --   INR 1.0  --   HEPARINUNFRC  --  0.46  CREATININE 1.03  --   TROPONINIHS 16  --     Estimated Creatinine Clearance: 68.3 mL/min (by C-G formula based on SCr of 1.03 mg/dL).  Assessment: 74 yo M admitted for STEMI, found to have severe left main disease. S/p POBA and IABP placed during cath lab. Pt on heparin post cath. Plan for CABG on 2/7.  Heparin level 0.46 (therapeutic) on gtt at 950 units/hr. No issues noted.  Goal of Therapy:  Heparin level 0.2-0.5 units/ml Monitor platelets by anticoagulation protocol: Yes   Plan:  Continue heparin at 950 units/hr F/u a.m. heparin level  4/7, PharmD, BCPS Please see amion for complete clinical pharmacist phone list 09/16/2019       11:40 PM

## 2019-09-16 NOTE — Progress Notes (Signed)
ANTICOAGULATION CONSULT NOTE - Initial Consult  Pharmacy Consult for heparin Indication: STEMI, CABG workup with IABP  Allergies  Allergen Reactions  . Lisinopril Cough    Patient Measurements:   Heparin Dosing Weight: 83.8 kg  Vital Signs: Temp: 98.4 F (36.9 C) (02/06 1230) Temp Source: Oral (02/06 1230) BP: 115/95 (02/06 1400) Pulse Rate: 56 (02/06 1400)  Labs: Recent Labs    09/16/19 0852  HGB 15.8  HCT 48.5  PLT 252  APTT 33  LABPROT 13.2  INR 1.0  CREATININE 1.03  TROPONINIHS 16    Estimated Creatinine Clearance: 68.3 mL/min (by C-G formula based on SCr of 1.03 mg/dL).   Medical History: Past Medical History:  Diagnosis Date  . Diabetes mellitus without complication (HCC)   . Hyperlipidemia   . Hypertension     Medications:  Scheduled:   Infusions:  . sodium chloride 250 mL (09/16/19 1415)  . famotidine (PEPCID) IV    . heparin 1,150 Units/hr (09/16/19 1425)  . nitroGLYCERIN 5 mcg/min (09/16/19 1417)  . banana bag IV 1000 mL      Assessment: 74 yo M admitted for STEMI, found to have severe left main disease. S/p POBA and IABP placed during cath lab. Transferred to [facility] for CABG evaluation. Pharmacy has been consulted to resume IV heparin with IABP.  Goal of Therapy:  Heparin level 0.2-0.5 Monitor platelets by anticoagulation protocol: Yes   Plan:  Start IV heparin at 950 units/hr Check heparin level in 8 hours Daily heparin level and CBC Monitor for bleeding F/u CABG workup  Danae Orleans, PharmD, [facility] PGY2 Cardiology Pharmacy Resident Phone [phone] 09/16/2019       2:37 PM  Please check [email or web address] for unit-specific pharmacist phone numbers

## 2019-09-16 NOTE — Progress Notes (Signed)
8:55am - CH paged to ED for code STEMI; pt. awake, speaking w/medical team; pt. reports he drove himself to [facility] this morning; CH introduced himself and provided brief prayer @ pt.'s request after radiology team perfomed X-Rays; CH met w/pt.'s wife (Karen) in the ED waiting rm. and accompanied her to pt.'s rm.  Pt. sent to cath lab; CH escorted wife to waiting area.  Wife requested CH call her when pt. is out of procedure and has room.  Overall wife in good spirits, coping effectively; reported she was going home to throw out all of pt.'s 'junk food' and make ' a new list of rules for him.' No further needs expressed at this time.  11:15am - CH paged by cath lab attempting to contact wife; [facility] for [facility] surgery RN reported.  CH made several attempts to contact wife but line appeared busy.  Chart says MD updated wife on pt.'s transfer to [facility]; CH recommends [facility] chaplains follow up w/pt. and wife if possible.         09/16/19 0855  Clinical Encounter Type  Visited With Patient;Family;Patient and family together;Health care provider  Visit Type ED;Psychological support;Spiritual support;Patient in surgery  Referral From Nurse  Consult/Referral To Chaplain  Recommendations  ([facility] chaplains follow up w/wife as needed)  Spiritual Encounters  Spiritual Needs Prayer;Emotional  Stress Factors  Patient Stress Factors Health changes  Family Stress Factors Loss of control;Health changes   

## 2019-09-16 NOTE — ED Notes (Signed)
Verbal order from Dr. Marisa Severin to start heparin infusion at 1000units/hr with 4,000unit bolus (while waiting for pharmacy)

## 2019-09-16 NOTE — ED Triage Notes (Signed)
Patient c/o central chest pressure that started today with SOB

## 2019-09-17 ENCOUNTER — Inpatient Hospital Stay (HOSPITAL_COMMUNITY)
Admission: AD | Disposition: A | Discharge status: Home / Self Care | Payer: Self-pay | Source: Other Acute Inpatient Hospital | Attending: Cardiothoracic Surgery

## 2019-09-17 ENCOUNTER — Inpatient Hospital Stay (HOSPITAL_COMMUNITY): Payer: No Typology Code available for payment source

## 2019-09-17 ENCOUNTER — Encounter (HOSPITAL_COMMUNITY): Payer: Self-pay | Admitting: Cardiothoracic Surgery

## 2019-09-17 ENCOUNTER — Inpatient Hospital Stay (HOSPITAL_COMMUNITY): Payer: No Typology Code available for payment source | Admitting: Certified Registered Nurse Anesthetist

## 2019-09-17 DIAGNOSIS — I2510 Atherosclerotic heart disease of native coronary artery without angina pectoris: Secondary | ICD-10-CM

## 2019-09-17 DIAGNOSIS — Z452 Encounter for adjustment and management of vascular access device: Secondary | ICD-10-CM | POA: Diagnosis not present

## 2019-09-17 DIAGNOSIS — I25110 Atherosclerotic heart disease of native coronary artery with unstable angina pectoris: Secondary | ICD-10-CM | POA: Diagnosis not present

## 2019-09-17 DIAGNOSIS — I219 Acute myocardial infarction, unspecified: Secondary | ICD-10-CM

## 2019-09-17 DIAGNOSIS — J9811 Atelectasis: Secondary | ICD-10-CM | POA: Diagnosis not present

## 2019-09-17 DIAGNOSIS — Z01810 Encounter for preprocedural cardiovascular examination: Secondary | ICD-10-CM

## 2019-09-17 DIAGNOSIS — E119 Type 2 diabetes mellitus without complications: Secondary | ICD-10-CM | POA: Diagnosis not present

## 2019-09-17 DIAGNOSIS — Z951 Presence of aortocoronary bypass graft: Secondary | ICD-10-CM

## 2019-09-17 DIAGNOSIS — Z20822 Contact with and (suspected) exposure to covid-19: Secondary | ICD-10-CM | POA: Diagnosis not present

## 2019-09-17 DIAGNOSIS — I213 ST elevation (STEMI) myocardial infarction of unspecified site: Secondary | ICD-10-CM | POA: Diagnosis not present

## 2019-09-17 HISTORY — PX: TEE WITHOUT CARDIOVERSION: SHX5443

## 2019-09-17 HISTORY — PX: CORONARY ARTERY BYPASS GRAFT: SHX141

## 2019-09-17 LAB — CBC
HCT: 44.6 % (ref 39.0–52.0)
HCT: 36.7 % — ABNORMAL LOW (ref 39.0–52.0)
Hemoglobin: 14.6 g/dL (ref 13.0–17.0)
Hemoglobin: 11.9 g/dL — ABNORMAL LOW (ref 13.0–17.0)
MCH: 29.1 pg (ref 26.0–34.0)
MCH: 28.9 pg (ref 26.0–34.0)
MCHC: 32.7 g/dL (ref 30.0–36.0)
MCHC: 32.4 g/dL (ref 30.0–36.0)
MCV: 88.8 fL (ref 80.0–100.0)
MCV: 89.1 fL (ref 80.0–100.0)
Platelets: 209 10*3/uL (ref 150–400)
Platelets: 128 10*3/uL — ABNORMAL LOW (ref 150–400)
RBC: 5.02 MIL/uL (ref 4.22–5.81)
RBC: 4.12 MIL/uL — ABNORMAL LOW (ref 4.22–5.81)
RDW: 14 % (ref 11.5–15.5)
RDW: 13.9 % (ref 11.5–15.5)
WBC: 10.9 10*3/uL — ABNORMAL HIGH (ref 4.0–10.5)
WBC: 13.3 10*3/uL — ABNORMAL HIGH (ref 4.0–10.5)
nRBC: 0 % (ref 0.0–0.2)
nRBC: 0 % (ref 0.0–0.2)

## 2019-09-17 LAB — POCT I-STAT, CHEM 8
BUN: 12 mg/dL (ref 8–23)
BUN: 11 mg/dL (ref 8–23)
BUN: 12 mg/dL (ref 8–23)
BUN: 12 mg/dL (ref 8–23)
BUN: 12 mg/dL (ref 8–23)
Calcium, Ion: 1.15 mmol/L (ref 1.15–1.40)
Calcium, Ion: 1.05 mmol/L — ABNORMAL LOW (ref 1.15–1.40)
Calcium, Ion: 1.09 mmol/L — ABNORMAL LOW (ref 1.15–1.40)
Calcium, Ion: 1.08 mmol/L — ABNORMAL LOW (ref 1.15–1.40)
Calcium, Ion: 1.1 mmol/L — ABNORMAL LOW (ref 1.15–1.40)
Chloride: 104 mmol/L (ref 98–111)
Chloride: 104 mmol/L (ref 98–111)
Chloride: 105 mmol/L (ref 98–111)
Chloride: 106 mmol/L (ref 98–111)
Chloride: 106 mmol/L (ref 98–111)
Creatinine, Ser: 0.8 mg/dL (ref 0.61–1.24)
Creatinine, Ser: 0.8 mg/dL (ref 0.61–1.24)
Creatinine, Ser: 0.9 mg/dL (ref 0.61–1.24)
Creatinine, Ser: 0.8 mg/dL (ref 0.61–1.24)
Creatinine, Ser: 0.8 mg/dL (ref 0.61–1.24)
Glucose, Bld: 111 mg/dL — ABNORMAL HIGH (ref 70–99)
Glucose, Bld: 109 mg/dL — ABNORMAL HIGH (ref 70–99)
Glucose, Bld: 124 mg/dL — ABNORMAL HIGH (ref 70–99)
Glucose, Bld: 119 mg/dL — ABNORMAL HIGH (ref 70–99)
Glucose, Bld: 102 mg/dL — ABNORMAL HIGH (ref 70–99)
HCT: 36 % — ABNORMAL LOW (ref 39.0–52.0)
HCT: 30 % — ABNORMAL LOW (ref 39.0–52.0)
HCT: 30 % — ABNORMAL LOW (ref 39.0–52.0)
HCT: 30 % — ABNORMAL LOW (ref 39.0–52.0)
HCT: 35 % — ABNORMAL LOW (ref 39.0–52.0)
Hemoglobin: 12.2 g/dL — ABNORMAL LOW (ref 13.0–17.0)
Hemoglobin: 10.2 g/dL — ABNORMAL LOW (ref 13.0–17.0)
Hemoglobin: 10.2 g/dL — ABNORMAL LOW (ref 13.0–17.0)
Hemoglobin: 10.2 g/dL — ABNORMAL LOW (ref 13.0–17.0)
Hemoglobin: 11.9 g/dL — ABNORMAL LOW (ref 13.0–17.0)
Potassium: 4.2 mmol/L (ref 3.5–5.1)
Potassium: 4.8 mmol/L (ref 3.5–5.1)
Potassium: 5 mmol/L (ref 3.5–5.1)
Potassium: 4.4 mmol/L (ref 3.5–5.1)
Potassium: 4.1 mmol/L (ref 3.5–5.1)
Sodium: 139 mmol/L (ref 135–145)
Sodium: 139 mmol/L (ref 135–145)
Sodium: 139 mmol/L (ref 135–145)
Sodium: 140 mmol/L (ref 135–145)
Sodium: 140 mmol/L (ref 135–145)
TCO2: 26 mmol/L (ref 22–32)
TCO2: 26 mmol/L (ref 22–32)
TCO2: 26 mmol/L (ref 22–32)
TCO2: 25 mmol/L (ref 22–32)
TCO2: 25 mmol/L (ref 22–32)

## 2019-09-17 LAB — POCT I-STAT 7, (LYTES, BLD GAS, ICA,H+H)
Acid-Base Excess: 1 mmol/L (ref 0.0–2.0)
Acid-base deficit: 3 mmol/L — ABNORMAL HIGH (ref 0.0–2.0)
Bicarbonate: 25.6 mmol/L (ref 20.0–28.0)
Bicarbonate: 22.4 mmol/L (ref 20.0–28.0)
Calcium, Ion: 0.98 mmol/L — ABNORMAL LOW (ref 1.15–1.40)
Calcium, Ion: 1.11 mmol/L — ABNORMAL LOW (ref 1.15–1.40)
HCT: 30 % — ABNORMAL LOW (ref 39.0–52.0)
HCT: 36 % — ABNORMAL LOW (ref 39.0–52.0)
Hemoglobin: 10.2 g/dL — ABNORMAL LOW (ref 13.0–17.0)
Hemoglobin: 12.2 g/dL — ABNORMAL LOW (ref 13.0–17.0)
O2 Saturation: 100 %
O2 Saturation: 95 %
Patient temperature: 37
Potassium: 4.3 mmol/L (ref 3.5–5.1)
Potassium: 4 mmol/L (ref 3.5–5.1)
Sodium: 141 mmol/L (ref 135–145)
Sodium: 141 mmol/L (ref 135–145)
TCO2: 27 mmol/L (ref 22–32)
TCO2: 24 mmol/L (ref 22–32)
pCO2 arterial: 40 mmHg (ref 32.0–48.0)
pCO2 arterial: 41.2 mmHg (ref 32.0–48.0)
pH, Arterial: 7.415 (ref 7.350–7.450)
pH, Arterial: 7.343 — ABNORMAL LOW (ref 7.350–7.450)
pO2, Arterial: 330 mmHg — ABNORMAL HIGH (ref 83.0–108.0)
pO2, Arterial: 82 mmHg — ABNORMAL LOW (ref 83.0–108.0)

## 2019-09-17 LAB — GLUCOSE, CAPILLARY
Glucose-Capillary: 111 mg/dL — ABNORMAL HIGH (ref 70–99)
Glucose-Capillary: 121 mg/dL — ABNORMAL HIGH (ref 70–99)
Glucose-Capillary: 123 mg/dL — ABNORMAL HIGH (ref 70–99)
Glucose-Capillary: 123 mg/dL — ABNORMAL HIGH (ref 70–99)
Glucose-Capillary: 127 mg/dL — ABNORMAL HIGH (ref 70–99)
Glucose-Capillary: 80 mg/dL (ref 70–99)
Glucose-Capillary: 95 mg/dL (ref 70–99)
Glucose-Capillary: 96 mg/dL (ref 70–99)

## 2019-09-17 LAB — PROTIME-INR
INR: 1.3 — ABNORMAL HIGH (ref 0.8–1.2)
Prothrombin Time: 16.5 seconds — ABNORMAL HIGH (ref 11.4–15.2)

## 2019-09-17 LAB — APTT: aPTT: 33 seconds (ref 24–36)

## 2019-09-17 LAB — BASIC METABOLIC PANEL
Anion gap: 12 (ref 5–15)
BUN: 15 mg/dL (ref 8–23)
CO2: 20 mmol/L — ABNORMAL LOW (ref 22–32)
Calcium: 8.5 mg/dL — ABNORMAL LOW (ref 8.9–10.3)
Chloride: 105 mmol/L (ref 98–111)
Creatinine, Ser: 0.93 mg/dL (ref 0.61–1.24)
GFR calc Af Amer: >60 mL/min (ref >60)
GFR calc non Af Amer: >60 mL/min (ref >60)
Glucose, Bld: 120 mg/dL — ABNORMAL HIGH (ref 70–99)
Potassium: 3.9 mmol/L (ref 3.5–5.1)
Sodium: 137 mmol/L (ref 135–145)

## 2019-09-17 LAB — HEPARIN LEVEL (UNFRACTIONATED): Heparin Unfractionated: 0.6 IU/mL (ref 0.30–0.70)

## 2019-09-17 LAB — SURGICAL PCR SCREEN
MRSA, PCR: NEGATIVE
Staphylococcus aureus: NEGATIVE

## 2019-09-17 LAB — PLATELET COUNT: Platelets: 155 10*3/uL (ref 150–400)

## 2019-09-17 LAB — HEMOGLOBIN AND HEMATOCRIT, BLOOD
HCT: 31.7 % — ABNORMAL LOW (ref 39.0–52.0)
Hemoglobin: 10.2 g/dL — ABNORMAL LOW (ref 13.0–17.0)

## 2019-09-17 SURGERY — CORONARY ARTERY BYPASS GRAFTING (CABG)
Anesthesia: General | Site: Chest

## 2019-09-17 MED ORDER — PHENYLEPHRINE HCL-NACL 20-0.9 MG/250ML-% IV SOLN: 0.0000 ug/min | INTRAVENOUS | Status: AC

## 2019-09-17 MED ORDER — PLASMA-LYTE 148 IV SOLN
INTRAVENOUS | Status: DC | PRN
  Administered 2019-09-17: 500 mL via INTRAVASCULAR

## 2019-09-17 MED ORDER — ACETAMINOPHEN 160 MG/5ML PO SOLN
1000.0000 mg | Freq: Four times a day (QID) | ORAL | Status: DC
  Administered 2019-09-17: 23:00:00 1000 mg
  Filled 2019-09-17: qty 40.6

## 2019-09-17 MED ORDER — FENTANYL CITRATE (PF) 250 MCG/5ML IJ SOLN
INTRAMUSCULAR | Status: AC
  Filled 2019-09-17: qty 25

## 2019-09-17 MED ORDER — CHLORHEXIDINE GLUCONATE 0.12 % MT SOLN
15.0000 mL | OROMUCOSAL | Status: AC
  Administered 2019-09-17: 20:00:00 15 mL via OROMUCOSAL

## 2019-09-17 MED ORDER — TRAMADOL HCL 50 MG PO TABS: 50.0000 mg | ORAL_TABLET | ORAL | Status: DC | PRN

## 2019-09-17 MED ORDER — BISACODYL 5 MG PO TBEC
10.0000 mg | DELAYED_RELEASE_TABLET | Freq: Every day | ORAL | Status: DC
  Administered 2019-09-18 – 2019-09-21 (×4): 10 mg via ORAL
  Filled 2019-09-17 (×4): qty 2

## 2019-09-17 MED ORDER — SODIUM CHLORIDE 0.9% FLUSH: 3.0000 mL | INTRAVENOUS | Status: DC | PRN

## 2019-09-17 MED ORDER — FENTANYL CITRATE (PF) 100 MCG/2ML IJ SOLN
INTRAMUSCULAR | Status: AC
  Administered 2019-09-17: 10:00:00 50 ug via INTRAVENOUS
  Filled 2019-09-17: qty 2

## 2019-09-17 MED ORDER — METOPROLOL TARTRATE 25 MG/10 ML ORAL SUSPENSION
12.5000 mg | Freq: Two times a day (BID) | ORAL | Status: DC
  Administered 2019-09-17: 12.5 mg
  Filled 2019-09-17: qty 5

## 2019-09-17 MED ORDER — VANCOMYCIN HCL 1000 MG IV SOLR
INTRAVENOUS | Status: DC | PRN
  Administered 2019-09-17: 3000 mg

## 2019-09-17 MED ORDER — FENTANYL CITRATE (PF) 100 MCG/2ML IJ SOLN: 100.0000 ug | Freq: Once | INTRAMUSCULAR | Status: AC

## 2019-09-17 MED ORDER — 0.9 % SODIUM CHLORIDE (POUR BTL) OPTIME
TOPICAL | Status: DC | PRN
  Administered 2019-09-17: 5000 mL

## 2019-09-17 MED ORDER — MIDAZOLAM HCL 2 MG/2ML IJ SOLN
INTRAMUSCULAR | Status: AC
  Administered 2019-09-17: 10:00:00 3 mg via INTRAVENOUS
  Filled 2019-09-17: qty 4

## 2019-09-17 MED ORDER — NITROGLYCERIN IN D5W 200-5 MCG/ML-% IV SOLN: 0.0000 ug/min | INTRAVENOUS | Status: DC

## 2019-09-17 MED ORDER — SODIUM CHLORIDE 0.9 % IV SOLN
1.5000 g | Freq: Two times a day (BID) | INTRAVENOUS | Status: AC
  Administered 2019-09-17 – 2019-09-19 (×4): 1.5 g via INTRAVENOUS
  Filled 2019-09-17 (×4): qty 1.5

## 2019-09-17 MED ORDER — SODIUM CHLORIDE (PF) 0.9 % IJ SOLN
OROMUCOSAL | Status: DC | PRN
  Administered 2019-09-17 (×2): 4 mL via TOPICAL

## 2019-09-17 MED ORDER — ROCURONIUM BROMIDE 10 MG/ML (PF) SYRINGE
PREFILLED_SYRINGE | INTRAVENOUS | Status: DC | PRN
  Administered 2019-09-17 (×3): 50 mg via INTRAVENOUS
  Administered 2019-09-17: 100 mg via INTRAVENOUS

## 2019-09-17 MED ORDER — ACETAMINOPHEN 650 MG RE SUPP
650.0000 mg | Freq: Once | RECTAL | Status: AC
  Administered 2019-09-17: 20:00:00 650 mg via RECTAL

## 2019-09-17 MED ORDER — METOPROLOL TARTRATE 12.5 MG HALF TABLET
12.5000 mg | ORAL_TABLET | Freq: Two times a day (BID) | ORAL | Status: DC
  Administered 2019-09-19: 12.5 mg via ORAL
  Filled 2019-09-17 (×3): qty 1

## 2019-09-17 MED ORDER — MIDAZOLAM HCL 5 MG/5ML IJ SOLN
INTRAMUSCULAR | Status: DC | PRN
  Administered 2019-09-17: 2 mg via INTRAVENOUS
  Administered 2019-09-17: 3 mg via INTRAVENOUS
  Administered 2019-09-17: 5 mg via INTRAVENOUS

## 2019-09-17 MED ORDER — VANCOMYCIN HCL 1000 MG IV SOLR
INTRAVENOUS | Status: AC
  Filled 2019-09-17: qty 2000

## 2019-09-17 MED ORDER — ONDANSETRON HCL 4 MG/2ML IJ SOLN: 4.0000 mg | Freq: Four times a day (QID) | INTRAMUSCULAR | Status: DC | PRN

## 2019-09-17 MED ORDER — EPINEPHRINE PF 1 MG/ML IJ SOLN
0.0000 ug/min | INTRAVENOUS | Status: DC
  Filled 2019-09-17: qty 4

## 2019-09-17 MED ORDER — LACTATED RINGERS IV SOLN: INTRAVENOUS | Status: DC | PRN

## 2019-09-17 MED ORDER — CHLORHEXIDINE GLUCONATE CLOTH 2 % EX PADS
6.0000 | MEDICATED_PAD | Freq: Every day | CUTANEOUS | Status: DC
  Administered 2019-09-17 – 2019-09-20 (×4): 6 via TOPICAL

## 2019-09-17 MED ORDER — FAMOTIDINE IN NACL 20-0.9 MG/50ML-% IV SOLN
20.0000 mg | Freq: Two times a day (BID) | INTRAVENOUS | Status: AC
  Administered 2019-09-17: 20 mg via INTRAVENOUS

## 2019-09-17 MED ORDER — LACTATED RINGERS IV SOLN: INTRAVENOUS | Status: DC

## 2019-09-17 MED ORDER — PHENYLEPHRINE HCL-NACL 10-0.9 MG/250ML-% IV SOLN
0.0000 ug/min | INTRAVENOUS | Status: DC
  Administered 2019-09-17: 50 ug/min via INTRAVENOUS
  Administered 2019-09-18: 65 ug/min via INTRAVENOUS
  Filled 2019-09-17 (×2): qty 250

## 2019-09-17 MED ORDER — ETOMIDATE 2 MG/ML IV SOLN
INTRAVENOUS | Status: DC | PRN
  Administered 2019-09-17: 14 mg via INTRAVENOUS

## 2019-09-17 MED ORDER — CHLORHEXIDINE GLUCONATE CLOTH 2 % EX PADS: 6.0000 | MEDICATED_PAD | Freq: Every day | CUTANEOUS | Status: DC

## 2019-09-17 MED ORDER — PANTOPRAZOLE SODIUM 40 MG PO TBEC
40.0000 mg | DELAYED_RELEASE_TABLET | Freq: Every day | ORAL | Status: DC
  Administered 2019-09-19 – 2019-09-21 (×3): 40 mg via ORAL
  Filled 2019-09-17 (×3): qty 1

## 2019-09-17 MED ORDER — DEXMEDETOMIDINE HCL IN NACL 400 MCG/100ML IV SOLN: 0.0000 ug/kg/h | INTRAVENOUS | Status: DC

## 2019-09-17 MED ORDER — SODIUM CHLORIDE 0.9% FLUSH: 10.0000 mL | INTRAVENOUS | Status: DC | PRN

## 2019-09-17 MED ORDER — ASPIRIN 81 MG PO CHEW: 324.0000 mg | CHEWABLE_TABLET | Freq: Every day | ORAL | Status: DC

## 2019-09-17 MED ORDER — ALBUMIN HUMAN 5 % IV SOLN: INTRAVENOUS | Status: DC | PRN

## 2019-09-17 MED ORDER — SUCCINYLCHOLINE CHLORIDE 200 MG/10ML IV SOSY
PREFILLED_SYRINGE | INTRAVENOUS | Status: DC | PRN
  Administered 2019-09-17: 140 mg via INTRAVENOUS

## 2019-09-17 MED ORDER — PROPOFOL 10 MG/ML IV BOLUS
INTRAVENOUS | Status: AC
  Filled 2019-09-17: qty 20

## 2019-09-17 MED ORDER — PROTAMINE SULFATE 10 MG/ML IV SOLN
INTRAVENOUS | Status: DC | PRN
  Administered 2019-09-17: 10 mg via INTRAVENOUS

## 2019-09-17 MED ORDER — HEMOSTATIC AGENTS (NO CHARGE) OPTIME
TOPICAL | Status: DC | PRN
  Administered 2019-09-17 (×2): 1 via TOPICAL

## 2019-09-17 MED ORDER — LIDOCAINE 2% (20 MG/ML) 5 ML SYRINGE
INTRAMUSCULAR | Status: DC | PRN
  Administered 2019-09-17: 100 mg via INTRAVENOUS

## 2019-09-17 MED ORDER — SODIUM CHLORIDE 0.9 % IV SOLN: INTRAVENOUS | Status: DC

## 2019-09-17 MED ORDER — FENTANYL CITRATE (PF) 250 MCG/5ML IJ SOLN
INTRAMUSCULAR | Status: DC | PRN
  Administered 2019-09-17: 100 ug via INTRAVENOUS
  Administered 2019-09-17 (×3): 150 ug via INTRAVENOUS
  Administered 2019-09-17: 50 ug via INTRAVENOUS
  Administered 2019-09-17: 100 ug via INTRAVENOUS
  Administered 2019-09-17: 150 ug via INTRAVENOUS
  Administered 2019-09-17: 100 ug via INTRAVENOUS
  Administered 2019-09-17 (×3): 50 ug via INTRAVENOUS
  Administered 2019-09-17: 150 ug via INTRAVENOUS

## 2019-09-17 MED ORDER — SODIUM CHLORIDE 0.45 % IV SOLN: INTRAVENOUS | Status: DC | PRN

## 2019-09-17 MED ORDER — DOCUSATE SODIUM 100 MG PO CAPS
200.0000 mg | ORAL_CAPSULE | Freq: Every day | ORAL | Status: DC
  Administered 2019-09-18 – 2019-09-21 (×4): 200 mg via ORAL
  Filled 2019-09-17 (×4): qty 2

## 2019-09-17 MED ORDER — SODIUM CHLORIDE 0.9% FLUSH
3.0000 mL | Freq: Two times a day (BID) | INTRAVENOUS | Status: DC
  Administered 2019-09-18 – 2019-09-20 (×3): 3 mL via INTRAVENOUS

## 2019-09-17 MED ORDER — VANCOMYCIN HCL IN DEXTROSE 1-5 GM/200ML-% IV SOLN
1000.0000 mg | Freq: Once | INTRAVENOUS | Status: AC
  Administered 2019-09-18: 02:00:00 1000 mg via INTRAVENOUS
  Filled 2019-09-17: qty 200

## 2019-09-17 MED ORDER — MORPHINE SULFATE (PF) 2 MG/ML IV SOLN
1.0000 mg | INTRAVENOUS | Status: DC | PRN
  Administered 2019-09-17 – 2019-09-18 (×2): 2 mg via INTRAVENOUS
  Filled 2019-09-17 (×2): qty 1

## 2019-09-17 MED ORDER — BISACODYL 10 MG RE SUPP: 10.0000 mg | Freq: Every day | RECTAL | Status: DC

## 2019-09-17 MED ORDER — OXYCODONE HCL 5 MG PO TABS
5.0000 mg | ORAL_TABLET | ORAL | Status: DC | PRN
  Administered 2019-09-17: 23:00:00 5 mg via ORAL
  Administered 2019-09-18 (×2): 10 mg via ORAL
  Administered 2019-09-20 (×2): 5 mg via ORAL
  Filled 2019-09-17 (×2): qty 2
  Filled 2019-09-17 (×3): qty 1

## 2019-09-17 MED ORDER — POTASSIUM CHLORIDE 10 MEQ/50ML IV SOLN: 10.0000 meq | INTRAVENOUS | Status: AC

## 2019-09-17 MED ORDER — HEPARIN SODIUM (PORCINE) 1000 UNIT/ML IJ SOLN
INTRAMUSCULAR | Status: DC | PRN
  Administered 2019-09-17: 30000 [IU] via INTRAVENOUS

## 2019-09-17 MED ORDER — ACETAMINOPHEN 160 MG/5ML PO SOLN: 650.0000 mg | Freq: Once | ORAL | Status: AC

## 2019-09-17 MED ORDER — ALBUMIN HUMAN 5 % IV SOLN
250.0000 mL | INTRAVENOUS | Status: AC | PRN
  Administered 2019-09-17 – 2019-09-18 (×4): 12.5 g via INTRAVENOUS
  Filled 2019-09-17 (×2): qty 250

## 2019-09-17 MED ORDER — METOPROLOL TARTRATE 5 MG/5ML IV SOLN: 2.5000 mg | INTRAVENOUS | Status: DC | PRN

## 2019-09-17 MED ORDER — INSULIN REGULAR(HUMAN) IN NACL 100-0.9 UT/100ML-% IV SOLN
INTRAVENOUS | Status: DC
  Administered 2019-09-18: 08:00:00 1.2 [IU]/h via INTRAVENOUS

## 2019-09-17 MED ORDER — ATORVASTATIN CALCIUM 80 MG PO TABS
80.0000 mg | ORAL_TABLET | Freq: Every day | ORAL | Status: DC
  Administered 2019-09-18 – 2019-09-21 (×4): 80 mg via ORAL
  Filled 2019-09-17 (×4): qty 1

## 2019-09-17 MED ORDER — ASPIRIN EC 325 MG PO TBEC: 325.0000 mg | DELAYED_RELEASE_TABLET | Freq: Every day | ORAL | Status: DC

## 2019-09-17 MED ORDER — MIDAZOLAM HCL (PF) 10 MG/2ML IJ SOLN
INTRAMUSCULAR | Status: AC
  Filled 2019-09-17: qty 2

## 2019-09-17 MED ORDER — MIDAZOLAM HCL 2 MG/2ML IJ SOLN
2.0000 mg | INTRAMUSCULAR | Status: DC | PRN
  Filled 2019-09-17: qty 2

## 2019-09-17 MED ORDER — SODIUM CHLORIDE 0.9 % IV SOLN: 250.0000 mL | INTRAVENOUS | Status: DC

## 2019-09-17 MED ORDER — SODIUM CHLORIDE 0.9% FLUSH
10.0000 mL | Freq: Two times a day (BID) | INTRAVENOUS | Status: DC
  Administered 2019-09-17: 12:00:00 10 mL

## 2019-09-17 MED ORDER — MIDAZOLAM HCL 2 MG/2ML IJ SOLN: 3.0000 mg | Freq: Once | INTRAMUSCULAR | Status: AC

## 2019-09-17 MED ORDER — MAGNESIUM SULFATE 4 GM/100ML IV SOLN
4.0000 g | Freq: Once | INTRAVENOUS | Status: AC
  Administered 2019-09-17: 4 g via INTRAVENOUS
  Filled 2019-09-17: qty 100

## 2019-09-17 MED ORDER — SODIUM CHLORIDE 0.9 % IV BOLUS
500.0000 mL | Freq: Once | INTRAVENOUS | Status: AC
  Administered 2019-09-17: 500 mL via INTRAVENOUS

## 2019-09-17 MED ORDER — LACTATED RINGERS IV SOLN: 500.0000 mL | Freq: Once | INTRAVENOUS | Status: DC | PRN

## 2019-09-17 MED ORDER — ACETAMINOPHEN 500 MG PO TABS
1000.0000 mg | ORAL_TABLET | Freq: Four times a day (QID) | ORAL | Status: DC
  Administered 2019-09-18 – 2019-09-21 (×14): 1000 mg via ORAL
  Filled 2019-09-17 (×14): qty 2

## 2019-09-17 MED ORDER — DEXTROSE 50 % IV SOLN: 0.0000 mL | INTRAVENOUS | Status: DC | PRN

## 2019-09-17 SURGICAL SUPPLY — 94 items
ADAPTER CARDIO PERF ANTE/RETRO (ADAPTER) ×3 IMPLANT
BAG DECANTER FOR FLEXI CONT (MISCELLANEOUS) ×3 IMPLANT
BASKET HEART (ORDER IN 25'S) (MISCELLANEOUS) ×1
BASKET HEART (ORDER IN 25S) (MISCELLANEOUS) ×2 IMPLANT
BATTERY MAXDRIVER (MISCELLANEOUS) ×1 IMPLANT
BLADE CLIPPER SURG (BLADE) ×3 IMPLANT
BLADE STERNUM SYSTEM 6 (BLADE) ×3 IMPLANT
BNDG ELASTIC 4X5.8 VLCR STR LF (GAUZE/BANDAGES/DRESSINGS) ×3 IMPLANT
BNDG ELASTIC 6X5.8 VLCR STR LF (GAUZE/BANDAGES/DRESSINGS) ×3 IMPLANT
BNDG GAUZE ELAST 4 BULKY (GAUZE/BANDAGES/DRESSINGS) ×3 IMPLANT
CANISTER SUCT 3000ML PPV (MISCELLANEOUS) ×3 IMPLANT
CANNULA NON VENT 22FR 12 (CANNULA) ×1 IMPLANT
CATH CPB KIT HENDRICKSON (MISCELLANEOUS) ×3 IMPLANT
CATH RETROPLEGIA CORONARY 14FR (CATHETERS) ×1 IMPLANT
CATH ROBINSON RED A/P 18FR (CATHETERS) ×7 IMPLANT
CLIP RETRACTION 3.0MM CORONARY (MISCELLANEOUS) ×3 IMPLANT
CONN ST 1/4X3/8  BEN (MISCELLANEOUS) ×3
CONN ST 1/4X3/8 BEN (MISCELLANEOUS) IMPLANT
DERMABOND ADVANCED (GAUZE/BANDAGES/DRESSINGS) ×1
DERMABOND ADVANCED .7 DNX12 (GAUZE/BANDAGES/DRESSINGS) ×2 IMPLANT
DRAIN CHANNEL 28F RND 3/8 FF (WOUND CARE) ×9 IMPLANT
DRAPE CARDIOVASCULAR INCISE (DRAPES) ×1
DRAPE SLUSH/WARMER DISC (DRAPES) ×3 IMPLANT
DRAPE SRG 135X102X78XABS (DRAPES) ×2 IMPLANT
DRSG AQUACEL AG ADV 3.5X14 (GAUZE/BANDAGES/DRESSINGS) ×3 IMPLANT
ELECT BLADE 4.0 EZ CLEAN MEGAD (MISCELLANEOUS) ×3
ELECT CAUTERY BLADE 6.4 (BLADE) ×3 IMPLANT
ELECT REM PT RETURN 9FT ADLT (ELECTROSURGICAL) ×6
ELECTRODE BLDE 4.0 EZ CLN MEGD (MISCELLANEOUS) IMPLANT
ELECTRODE REM PT RTRN 9FT ADLT (ELECTROSURGICAL) ×4 IMPLANT
FELT TEFLON 1X6 (MISCELLANEOUS) ×5 IMPLANT
GAUZE SPONGE 4X4 12PLY STRL (GAUZE/BANDAGES/DRESSINGS) ×6 IMPLANT
GLOVE NEODERM STRL 7.5 LF PF (GLOVE) ×6 IMPLANT
GLOVE SURG NEODERM 7.5  LF PF (GLOVE) ×2
GOWN STRL REUS W/ TWL LRG LVL3 (GOWN DISPOSABLE) ×8 IMPLANT
GOWN STRL REUS W/TWL LRG LVL3 (GOWN DISPOSABLE) ×8
HEMOSTAT POWDER SURGIFOAM 1G (HEMOSTASIS) ×6 IMPLANT
HEMOSTAT SURGICEL 2X14 (HEMOSTASIS) ×1 IMPLANT
INSERT FOGARTY XLG (MISCELLANEOUS) ×1 IMPLANT
KIT BASIN OR (CUSTOM PROCEDURE TRAY) ×3 IMPLANT
KIT SUCTION CATH 14FR (SUCTIONS) ×3 IMPLANT
KIT TURNOVER KIT B (KITS) ×3 IMPLANT
KIT VASOVIEW HEMOPRO 2 VH 4000 (KITS) ×3 IMPLANT
MARKER GRAFT CORONARY BYPASS (MISCELLANEOUS) ×9 IMPLANT
NDL 18GX1X1/2 (RX/OR ONLY) (NEEDLE) ×2 IMPLANT
NEEDLE 18GX1X1/2 (RX/OR ONLY) (NEEDLE) IMPLANT
NS IRRIG 1000ML POUR BTL (IV SOLUTION) ×15 IMPLANT
PACK E OPEN HEART (SUTURE) ×3 IMPLANT
PACK OPEN HEART (CUSTOM PROCEDURE TRAY) ×3 IMPLANT
PACK SPY-PHI (KITS) IMPLANT
PAD ARMBOARD 7.5X6 YLW CONV (MISCELLANEOUS) ×6 IMPLANT
PAD ELECT DEFIB RADIOL ZOLL (MISCELLANEOUS) ×3 IMPLANT
PENCIL BUTTON HOLSTER BLD 10FT (ELECTRODE) ×4 IMPLANT
PLATE STERNAL 2.3X208 14H 2-PK (Plate) ×1 IMPLANT
POSITIONER HEAD DONUT 9IN (MISCELLANEOUS) ×3 IMPLANT
POWDER SURGICEL 3.0 GRAM (HEMOSTASIS) ×1 IMPLANT
PUNCH AORTIC ROT 4.0MM RCL 40 (MISCELLANEOUS) ×1 IMPLANT
SCREW LOCKING TI 2.3X11MM (Screw) ×2 IMPLANT
SCREW LOCKING TI 2.3X13MM (Screw) ×3 IMPLANT
SCREW STERNAL LOCK 2.3MM (Screw) ×4 IMPLANT
SEALANT SURG COSEAL 8ML (VASCULAR PRODUCTS) ×1 IMPLANT
SET CARDIOPLEGIA MPS 5001102 (MISCELLANEOUS) ×1 IMPLANT
SUT BONE WAX W31G (SUTURE) ×3 IMPLANT
SUT MNCRL AB 3-0 PS2 18 (SUTURE) ×6 IMPLANT
SUT PDS AB 1 CTX 36 (SUTURE) ×6 IMPLANT
SUT PROLENE 3 0 SH DA (SUTURE) ×3 IMPLANT
SUT PROLENE 5 0 C 1 36 (SUTURE) IMPLANT
SUT PROLENE 6 0 C 1 30 (SUTURE) ×8 IMPLANT
SUT PROLENE 8 0 BV175 6 (SUTURE) IMPLANT
SUT PROLENE BLUE 7 0 (SUTURE) ×3 IMPLANT
SUT SILK  1 MH (SUTURE) ×1
SUT SILK 1 MH (SUTURE) IMPLANT
SUT SILK 2 0 SH CR/8 (SUTURE) IMPLANT
SUT SILK 3 0 SH CR/8 (SUTURE) IMPLANT
SUT STEEL 6MS V (SUTURE) ×3 IMPLANT
SUT STEEL SZ 6 DBL 3X14 BALL (SUTURE) ×3 IMPLANT
SUT VIC AB 2-0 CT1 27 (SUTURE) ×1
SUT VIC AB 2-0 CT1 TAPERPNT 27 (SUTURE) IMPLANT
SUT VIC AB 2-0 CTX 27 (SUTURE) IMPLANT
SUT VIC AB 3-0 X1 27 (SUTURE) ×1 IMPLANT
SYR 10ML LL (SYRINGE) IMPLANT
SYR 30ML LL (SYRINGE) ×2 IMPLANT
SYR 3ML LL SCALE MARK (SYRINGE) ×2 IMPLANT
SYSTEM SAHARA CHEST DRAIN ATS (WOUND CARE) ×3 IMPLANT
TAPE CLOTH SURG 4X10 WHT LF (GAUZE/BANDAGES/DRESSINGS) ×1 IMPLANT
TAPE PAPER 2X10 WHT MICROPORE (GAUZE/BANDAGES/DRESSINGS) ×1 IMPLANT
TOWEL GREEN STERILE (TOWEL DISPOSABLE) ×3 IMPLANT
TOWEL GREEN STERILE FF (TOWEL DISPOSABLE) ×3 IMPLANT
TRAY FOLEY SLVR 16FR TEMP STAT (SET/KITS/TRAYS/PACK) ×3 IMPLANT
TUBE SUCT INTRACARD DLP 20F (MISCELLANEOUS) ×1 IMPLANT
TUBING LAP HI FLOW INSUFFLATIO (TUBING) ×3 IMPLANT
UNDERPAD 30X30 (UNDERPADS AND DIAPERS) ×3 IMPLANT
WATER STERILE IRR 1000ML POUR (IV SOLUTION) ×6 IMPLANT
WATER STERILE IRR 1000ML UROMA (IV SOLUTION) IMPLANT

## 2019-09-17 NOTE — Progress Notes (Signed)
ANTICOAGULATION CONSULT NOTE   Pharmacy Consult for heparin Indication: STEMI, IABP  Allergies  Allergen Reactions  . Lisinopril Cough    Patient Measurements: Height: 5\' 6"  (167.6 cm) Weight: 220 lb 7.4 oz (100 kg) IBW/kg (Calculated) : 63.8 Heparin Dosing Weight: 83.8 kg  Vital Signs: Temp: 98.4 F (36.9 C) (02/07 0315) Temp Source: Oral (02/07 0315) BP: 124/56 (02/07 0700) Pulse Rate: 73 (02/07 0700)  Labs: Recent Labs    09/16/19 0852 09/16/19 2256 09/17/19 0502  HGB 15.8  --  14.6  HCT 48.5  --  44.6  PLT 252  --  209  APTT 33  --   --   LABPROT 13.2  --   --   INR 1.0  --   --   HEPARINUNFRC  --  0.46 0.60  CREATININE 1.03  --  0.93  TROPONINIHS 16  --   --     Estimated Creatinine Clearance: 78.3 mL/min (by C-G formula based on SCr of 0.93 mg/dL).  Assessment: 74 yo M admitted for STEMI, found to have severe left main disease. S/p POBA and IABP placed during cath lab. Pt on heparin post cath. Plan for CABG on 2/7.  Heparin level 0.6 (supratherapeutic) on gtt at 950 units/hr. No issues noted.  Goal of Therapy:  Heparin level 0.2-0.5 units/ml Monitor platelets by anticoagulation protocol: Yes   Plan:  Decrease heparin to 900 units/hr  CABG today  4/7, PharmD, [facility] PGY2 Cardiology Pharmacy Resident Phone [phone] 09/17/2019       7:41 AM  Please check [email or web address] for unit-specific pharmacist phone numbers

## 2019-09-17 NOTE — Progress Notes (Signed)
Wife called and informed of late start. She is aware, someone will call her when he is done with procedure

## 2019-09-17 NOTE — Plan of Care (Signed)

## 2019-09-17 NOTE — Anesthesia Procedure Notes (Signed)
Procedure Name: Intubation Date/Time: 09/17/2019 2:16 PM Performed by: Jed Limerick, CRNA Pre-anesthesia Checklist: Patient identified, Emergency Drugs available, Suction available and Patient being monitored Patient Re-evaluated:Patient Re-evaluated prior to induction Oxygen Delivery Method: Circle System Utilized Preoxygenation: Pre-oxygenation with 100% oxygen Induction Type: IV induction Ventilation: Mask ventilation without difficulty, Oral airway inserted - appropriate to patient size and Two handed mask ventilation required Laryngoscope Size: Glidescope and 4 Grade View: Grade I Tube type: Oral Tube size: 8.0 mm Number of attempts: 1 Airway Equipment and Method: Video-laryngoscopy,  Rigid stylet and Oral airway Placement Confirmation: ETT inserted through vocal cords under direct vision,  positive ETCO2 and breath sounds checked- equal and bilateral Secured at: 23 cm Tube secured with: Tape Dental Injury: Teeth and Oropharynx as per pre-operative assessment  Future Recommendations: Recommend- induction with short-acting agent, and alternative techniques readily available

## 2019-09-17 NOTE — Progress Notes (Signed)
To OR via bed. Wife called and updated on time. Report given to OR

## 2019-09-17 NOTE — Anesthesia Preprocedure Evaluation (Signed)
Anesthesia Evaluation  Patient identified by MRN, date of birth, ID band Patient awake    Reviewed: Allergy & Precautions, NPO status , Patient's Chart, lab work & pertinent test results  Airway Mallampati: II  TM Distance: >3 FB     Dental   Pulmonary former smoker,    breath sounds clear to auscultation       Cardiovascular hypertension, + Past MI   Rhythm:Regular Rate:Normal     Neuro/Psych    GI/Hepatic negative GI ROS, Neg liver ROS,   Endo/Other  diabetes  Renal/GU negative Renal ROS     Musculoskeletal   Abdominal   Peds  Hematology   Anesthesia Other Findings   Reproductive/Obstetrics                             Anesthesia Physical Anesthesia Plan  ASA: III  Anesthesia Plan: General   Post-op Pain Management:    Induction: Intravenous  PONV Risk Score and Plan: 3 and Ondansetron and Midazolam  Airway Management Planned: Oral ETT  Additional Equipment: Arterial line, PA Cath and TEE  Intra-op Plan:   Post-operative Plan: Post-operative intubation/ventilation  Informed Consent: I have reviewed the patients History and Physical, chart, labs and discussed the procedure including the risks, benefits and alternatives for the proposed anesthesia with the patient or authorized representative who has indicated his/her understanding and acceptance.     Dental advisory given  Plan Discussed with: CRNA, Surgeon and Anesthesiologist  Anesthesia Plan Comments:         Anesthesia Quick Evaluation

## 2019-09-17 NOTE — Progress Notes (Signed)
VASCULAR LAB PRELIMINARY  PRELIMINARY  PRELIMINARY  PRELIMINARY  Pre CABG Dopplers completed.    Preliminary report:  See CV proc for preliminary results.   Laurell Coalson, RVT 09/17/2019, 8:28 AM

## 2019-09-17 NOTE — H&P (Signed)
History and Physical Interval Note:  09/17/2019 1:34 PM  Brian Chapman  has presented today for surgery, with the diagnosis of stemi.  The various methods of treatment have been discussed with the patient and family. After consideration of risks, benefits and other options for treatment, the patient has consented to  Procedure(s): CORONARY ARTERY BYPASS GRAFTING (CABG) (N/A) TRANSESOPHAGEAL ECHOCARDIOGRAM (TEE) (N/A) as a surgical intervention.  The patient's history has been reviewed, patient examined, no change in status, stable for surgery.  I have reviewed the patient's chart and labs.  Questions were answered to the patient's satisfaction.     Linden Dolin

## 2019-09-17 NOTE — CV Procedure (Signed)
Radial arterial line placement.    Consent obtained.   The left wrist was prepped and draped in the routine sterile fashion a single lumen radial arterial catheter was placed in the left radial artery using a modified Seldinger technique. Good blood flow and wave forms. A dressing was placed.     Arvilla Meres, MD  10:40 AM

## 2019-09-17 NOTE — Plan of Care (Signed)
NPO for surgery today.  Delayed due to emergent first case.  Patient had PA and aline placed at bedside this AM. For CABG  Nursing DX impaired tissue perfusion related to poor coronary circulation Interventions: Maintain IABP , note for complications Assess BP titrate NTG accordingly to optimize flow Check pulses q1h especially left radial to assess migration Document urine output, note decrease=s and report.   Impaired gas exchange related to history OSA  *When extubated IS q1h *Monitor closely when given pain medications *CPAP at night  To optimize patient oxygenation and alveolar exchange *Titrate o2 cautiously to sao2 90% or above, patient does desaturate when sleeping * document all vital signs as ordered, note any changes and document interventions.

## 2019-09-17 NOTE — Progress Notes (Signed)
  Echocardiogram Echocardiogram Transesophageal has been performed.  Gerda Diss 09/17/2019, 3:20 PM

## 2019-09-17 NOTE — CV Procedure (Signed)
Procedure: Theone Murdoch Insertion  Indication: Hemodynamic Monitoring for CABG  The risks and indication of the procedure were explained. Consent was signed and placed on the chart. An appropriate timeout was taken prior to the procedure.   The right neck was prepped and draped in the routine sterile fashion and anesthetized with 1% local lidocaine. A 7 FR venous sheath was placed in the right internal jugular vein using a modified Seldinger technique and real-time u/s guidance. A standard Swan-Ganz catheter was used for the procedure. The distal tip of the PA cath was maneuvered into the right pulmonary artery under pressure waveform guidanceand the sheath was sutured in place.   CXR showed good position.   Arvilla Meres, MD  10:40 AM

## 2019-09-17 NOTE — Transfer of Care (Signed)
Immediate Anesthesia Transfer of Care Note  Patient: Brian Chapman  Procedure(s) Performed: CORONARY ARTERY BYPASS GRAFTING (CABG), ON PUMP, TIMES FOUR, USING LEFT INTERNAL MAMMARY ARTERY AND RIGHT GREATER SAPHENOUS VEIN HARVESTED ENDOSCOPICALLY (N/A Chest) TRANSESOPHAGEAL ECHOCARDIOGRAM (TEE) (N/A )  Patient Location: ICU  Anesthesia Type:General  Level of Consciousness: Patient remains intubated per anesthesia plan  Airway & Oxygen Therapy: Patient remains intubated per anesthesia plan and Patient placed on Ventilator (see vital sign flow sheet for setting)  Post-op Assessment: Report given to RN and Post -op Vital signs reviewed and stable  Post vital signs: Reviewed and stable  Last Vitals:  Vitals Value Taken Time  BP    Temp    Pulse 80 09/17/19 1922  Resp 14 09/17/19 1922  SpO2 100 % 09/17/19 1922  Vitals shown include unvalidated device data.  Last Pain:  Vitals:   09/17/19 1138  TempSrc: Oral  PainSc:          Complications: No apparent anesthesia complications

## 2019-09-17 NOTE — Brief Op Note (Signed)
09/16/2019 - 09/17/2019  6:05 PM  PATIENT:  Brian Chapman  74 y.o. male  PRE-OPERATIVE DIAGNOSIS:  ST elevation myocardial infarction  POST-OPERATIVE DIAGNOSIS:  ST elevation myocardial infarction  PROCEDURES:  CORONARY ARTERY BYPASS GRAFTING (CABG), ON PUMP, TIMES FOUR, USING LEFT INTERNAL MAMMARY ARTERY AND RIGHT GREATER SAPHENOUS VEIN HARVESTED ENDOSCOPICALLY   LIMA->LAD SVG->Diag SVG->OM1->OM2   TRANSESOPHAGEAL ECHOCARDIOGRAM   SURGEON:    Linden Dolin, MD - Primary  PHYSICIAN ASSISTANT: Sophiamarie Nease   ANESTHESIA:   general  EBL:  Per anesthesia and perfusion records   BLOOD ADMINISTERED:none  DRAINS: Left pleural and mediastinal tubes   LOCAL MEDICATIONS USED:  NONE  SPECIMEN:  No Specimen  DISPOSITION OF SPECIMEN:  N/A  COUNTS:  correct  DICTATION: .Dragon Dictation  PLAN OF CARE: Admit to inpatient   PATIENT DISPOSITION:  ICU - intubated and hemodynamically stable with IABP in place right femoral artery.    Delay start of Pharmacological VTE agent (>24hrs) due to surgical blood loss or risk of bleeding: yes

## 2019-09-18 ENCOUNTER — Inpatient Hospital Stay (HOSPITAL_COMMUNITY): Payer: No Typology Code available for payment source

## 2019-09-18 ENCOUNTER — Encounter: Payer: Self-pay | Admitting: Cardiology

## 2019-09-18 DIAGNOSIS — I2102 ST elevation (STEMI) myocardial infarction involving left anterior descending coronary artery: Secondary | ICD-10-CM

## 2019-09-18 DIAGNOSIS — Z4682 Encounter for fitting and adjustment of non-vascular catheter: Secondary | ICD-10-CM | POA: Diagnosis not present

## 2019-09-18 DIAGNOSIS — E119 Type 2 diabetes mellitus without complications: Secondary | ICD-10-CM | POA: Diagnosis not present

## 2019-09-18 DIAGNOSIS — I213 ST elevation (STEMI) myocardial infarction of unspecified site: Secondary | ICD-10-CM | POA: Diagnosis not present

## 2019-09-18 DIAGNOSIS — Z20822 Contact with and (suspected) exposure to covid-19: Secondary | ICD-10-CM | POA: Diagnosis not present

## 2019-09-18 DIAGNOSIS — I2510 Atherosclerotic heart disease of native coronary artery without angina pectoris: Secondary | ICD-10-CM | POA: Diagnosis not present

## 2019-09-18 DIAGNOSIS — Z951 Presence of aortocoronary bypass graft: Secondary | ICD-10-CM

## 2019-09-18 LAB — GLUCOSE, CAPILLARY
Glucose-Capillary: 117 mg/dL — ABNORMAL HIGH (ref 70–99)
Glucose-Capillary: 117 mg/dL — ABNORMAL HIGH (ref 70–99)
Glucose-Capillary: 120 mg/dL — ABNORMAL HIGH (ref 70–99)
Glucose-Capillary: 122 mg/dL — ABNORMAL HIGH (ref 70–99)
Glucose-Capillary: 123 mg/dL — ABNORMAL HIGH (ref 70–99)
Glucose-Capillary: 130 mg/dL — ABNORMAL HIGH (ref 70–99)
Glucose-Capillary: 135 mg/dL — ABNORMAL HIGH (ref 70–99)
Glucose-Capillary: 135 mg/dL — ABNORMAL HIGH (ref 70–99)
Glucose-Capillary: 136 mg/dL — ABNORMAL HIGH (ref 70–99)
Glucose-Capillary: 137 mg/dL — ABNORMAL HIGH (ref 70–99)
Glucose-Capillary: 140 mg/dL — ABNORMAL HIGH (ref 70–99)
Glucose-Capillary: 141 mg/dL — ABNORMAL HIGH (ref 70–99)
Glucose-Capillary: 143 mg/dL — ABNORMAL HIGH (ref 70–99)
Glucose-Capillary: 144 mg/dL — ABNORMAL HIGH (ref 70–99)
Glucose-Capillary: 144 mg/dL — ABNORMAL HIGH (ref 70–99)
Glucose-Capillary: 146 mg/dL — ABNORMAL HIGH (ref 70–99)
Glucose-Capillary: 149 mg/dL — ABNORMAL HIGH (ref 70–99)

## 2019-09-18 LAB — POCT I-STAT, CHEM 8
BUN: 13 mg/dL (ref 8–23)
Calcium, Ion: 1.14 mmol/L — ABNORMAL LOW (ref 1.15–1.40)
Chloride: 104 mmol/L (ref 98–111)
Creatinine, Ser: 0.8 mg/dL (ref 0.61–1.24)
Glucose, Bld: 109 mg/dL — ABNORMAL HIGH (ref 70–99)
HCT: 43 % (ref 39.0–52.0)
Hemoglobin: 14.6 g/dL (ref 13.0–17.0)
Potassium: 4.7 mmol/L (ref 3.5–5.1)
Sodium: 139 mmol/L (ref 135–145)
TCO2: 30 mmol/L (ref 22–32)

## 2019-09-18 LAB — BASIC METABOLIC PANEL
Anion gap: 8 (ref 5–15)
Anion gap: 10 (ref 5–15)
BUN: 13 mg/dL (ref 8–23)
BUN: 19 mg/dL (ref 8–23)
CO2: 21 mmol/L — ABNORMAL LOW (ref 22–32)
CO2: 22 mmol/L (ref 22–32)
Calcium: 7.3 mg/dL — ABNORMAL LOW (ref 8.9–10.3)
Calcium: 7.6 mg/dL — ABNORMAL LOW (ref 8.9–10.3)
Chloride: 106 mmol/L (ref 98–111)
Chloride: 104 mmol/L (ref 98–111)
Creatinine, Ser: 1.08 mg/dL (ref 0.61–1.24)
Creatinine, Ser: 1.19 mg/dL (ref 0.61–1.24)
GFR calc Af Amer: >60 mL/min (ref >60)
GFR calc Af Amer: >60 mL/min (ref >60)
GFR calc non Af Amer: >60 mL/min (ref >60)
GFR calc non Af Amer: >60 mL/min (ref >60)
Glucose, Bld: 126 mg/dL — ABNORMAL HIGH (ref 70–99)
Glucose, Bld: 130 mg/dL — ABNORMAL HIGH (ref 70–99)
Potassium: 4.3 mmol/L (ref 3.5–5.1)
Potassium: 4 mmol/L (ref 3.5–5.1)
Sodium: 135 mmol/L (ref 135–145)
Sodium: 136 mmol/L (ref 135–145)

## 2019-09-18 LAB — POCT I-STAT 7, (LYTES, BLD GAS, ICA,H+H)
Acid-base deficit: 4 mmol/L — ABNORMAL HIGH (ref 0.0–2.0)
Acid-base deficit: 4 mmol/L — ABNORMAL HIGH (ref 0.0–2.0)
Bicarbonate: 21.3 mmol/L (ref 20.0–28.0)
Bicarbonate: 21.7 mmol/L (ref 20.0–28.0)
Calcium, Ion: 1.11 mmol/L — ABNORMAL LOW (ref 1.15–1.40)
Calcium, Ion: 1.11 mmol/L — ABNORMAL LOW (ref 1.15–1.40)
HCT: 35 % — ABNORMAL LOW (ref 39.0–52.0)
HCT: 35 % — ABNORMAL LOW (ref 39.0–52.0)
Hemoglobin: 11.9 g/dL — ABNORMAL LOW (ref 13.0–17.0)
Hemoglobin: 11.9 g/dL — ABNORMAL LOW (ref 13.0–17.0)
O2 Saturation: 92 %
O2 Saturation: 95 %
Patient temperature: 38.5
Patient temperature: 38.7
Potassium: 4.4 mmol/L (ref 3.5–5.1)
Potassium: 4.4 mmol/L (ref 3.5–5.1)
Sodium: 139 mmol/L (ref 135–145)
Sodium: 139 mmol/L (ref 135–145)
TCO2: 22 mmol/L (ref 22–32)
TCO2: 23 mmol/L (ref 22–32)
pCO2 arterial: 42 mmHg (ref 32.0–48.0)
pCO2 arterial: 44.7 mmHg (ref 32.0–48.0)
pH, Arterial: 7.302 — ABNORMAL LOW (ref 7.350–7.450)
pH, Arterial: 7.32 — ABNORMAL LOW (ref 7.350–7.450)
pO2, Arterial: 75 mmHg — ABNORMAL LOW (ref 83.0–108.0)
pO2, Arterial: 86 mmHg (ref 83.0–108.0)

## 2019-09-18 LAB — MAGNESIUM
Magnesium: 2.4 mg/dL (ref 1.7–2.4)
Magnesium: 2.2 mg/dL (ref 1.7–2.4)

## 2019-09-18 LAB — CBC
HCT: 36.5 % — ABNORMAL LOW (ref 39.0–52.0)
HCT: 32.5 % — ABNORMAL LOW (ref 39.0–52.0)
Hemoglobin: 12.1 g/dL — ABNORMAL LOW (ref 13.0–17.0)
Hemoglobin: 10.3 g/dL — ABNORMAL LOW (ref 13.0–17.0)
MCH: 29.7 pg (ref 26.0–34.0)
MCH: 28.9 pg (ref 26.0–34.0)
MCHC: 33.2 g/dL (ref 30.0–36.0)
MCHC: 31.7 g/dL (ref 30.0–36.0)
MCV: 89.5 fL (ref 80.0–100.0)
MCV: 91.3 fL (ref 80.0–100.0)
Platelets: 137 10*3/uL — ABNORMAL LOW (ref 150–400)
Platelets: 130 10*3/uL — ABNORMAL LOW (ref 150–400)
RBC: 4.08 MIL/uL — ABNORMAL LOW (ref 4.22–5.81)
RBC: 3.56 MIL/uL — ABNORMAL LOW (ref 4.22–5.81)
RDW: 14 % (ref 11.5–15.5)
RDW: 14.2 % (ref 11.5–15.5)
WBC: 13.8 10*3/uL — ABNORMAL HIGH (ref 4.0–10.5)
WBC: 15.4 10*3/uL — ABNORMAL HIGH (ref 4.0–10.5)
nRBC: 0 % (ref 0.0–0.2)
nRBC: 0 % (ref 0.0–0.2)

## 2019-09-18 MED ORDER — ORAL CARE MOUTH RINSE
15.0000 mL | Freq: Two times a day (BID) | OROMUCOSAL | Status: DC
  Administered 2019-09-18 – 2019-09-20 (×4): 15 mL via OROMUCOSAL

## 2019-09-18 MED ORDER — SODIUM CHLORIDE 0.9% FLUSH: 10.0000 mL | INTRAVENOUS | Status: DC | PRN

## 2019-09-18 MED ORDER — KETOROLAC TROMETHAMINE 15 MG/ML IJ SOLN
15.0000 mg | Freq: Once | INTRAMUSCULAR | Status: AC
  Administered 2019-09-18: 09:00:00 15 mg via INTRAVENOUS
  Filled 2019-09-18: qty 1

## 2019-09-18 MED ORDER — CLOPIDOGREL BISULFATE 75 MG PO TABS
75.0000 mg | ORAL_TABLET | Freq: Every day | ORAL | Status: DC
  Administered 2019-09-18 – 2019-09-21 (×4): 75 mg via ORAL
  Filled 2019-09-18 (×4): qty 1

## 2019-09-18 MED ORDER — KETOROLAC TROMETHAMINE 15 MG/ML IJ SOLN
7.5000 mg | Freq: Four times a day (QID) | INTRAMUSCULAR | Status: AC
  Administered 2019-09-18 – 2019-09-19 (×5): 7.5 mg via INTRAVENOUS
  Filled 2019-09-18 (×5): qty 1

## 2019-09-18 MED ORDER — SODIUM CHLORIDE 0.9% FLUSH
10.0000 mL | Freq: Two times a day (BID) | INTRAVENOUS | Status: DC
  Administered 2019-09-18 – 2019-09-21 (×6): 10 mL

## 2019-09-18 MED ORDER — ASPIRIN 81 MG PO CHEW
81.0000 mg | CHEWABLE_TABLET | Freq: Every day | ORAL | Status: DC
  Administered 2019-09-18 – 2019-09-21 (×4): 81 mg via ORAL
  Filled 2019-09-18 (×4): qty 1

## 2019-09-18 MED ORDER — PHENYLEPHRINE HCL-NACL 20-0.9 MG/250ML-% IV SOLN: 0.0000 ug/min | INTRAVENOUS | Status: DC

## 2019-09-18 MED ORDER — METOCLOPRAMIDE HCL 5 MG/ML IJ SOLN
5.0000 mg | Freq: Two times a day (BID) | INTRAMUSCULAR | Status: AC
  Administered 2019-09-18 – 2019-09-19 (×4): 5 mg via INTRAVENOUS
  Filled 2019-09-18 (×4): qty 2

## 2019-09-18 NOTE — Progress Notes (Signed)
Pt tolerated 40/4 well, flipped to CPAP/PS by RT. Will assess ABG in 20 minutes.

## 2019-09-18 NOTE — Progress Notes (Signed)
Pt placed on 40/4 by RT now that pt is more awake. Tachypneic in 30s initially, but slowing to 20s after restarting precedex at 0.1. This RN at bedside to monitor.

## 2019-09-18 NOTE — Op Note (Signed)
CARDIOTHORACIC SURGERY OPERATIVE NOTE  Date of Procedure: 09/17/2019  Preoperative Diagnosis: Left Main Coronary Artery Disease s/p STEMI on 09/16/19  Postoperative Diagnosis: Same  Procedure:    Coronary Artery Bypass Grafting x 4  Left Internal Mammary Artery to Distal Left Anterior Descending Coronary Artery; Saphenous Vein Graft to right Posterolateral Coronary Artery and 1st Obtuse Marginal Branch of Left Circumflex Coronary Artery as a sequenced graft; Sapheonous Vein Graft to 1st  Diagonal Branch Coronary Artery; Endoscopic Vein Harvest from rightThigh and Lower Leg;  Rigid sternal reconstruction with linear plating system  Surgeon: B. Murvin Natal, MD  Assistant: Macarthur Critchley PA-C  Anesthesia: get  Operative Findings:  preserved left ventricular systolic function  good quality left internal mammary artery conduit  Good quality saphenous vein conduit  fair quality target vessels for grafting due to diffuse disease consistent with underlying diabetes    BRIEF CLINICAL NOTE AND INDICATIONS FOR SURGERY  74 yo man presented with sudden onset CP on 2/6. He underwent LHC showing severe LM CAD and occluded LAD distally, which was opened. He now presents for CABG.    DETAILS OF THE OPERATIVE PROCEDURE  Preparation:  The patient is brought to the operating room on the above mentioned date and central monitoring was established by the anesthesia team including placement of Swan-Ganz catheter and radial arterial line. The patient is placed in the supine position on the operating table.  Intravenous antibiotics are administered. General endotracheal anesthesia is induced uneventfully. A Foley catheter is placed.  Baseline transesophageal echocardiogram was performed.  Findings were notable for preserved LV function.    The patient's chest, abdomen, both groins, and both lower extremities are prepared and draped in a sterile manner. A time out procedure is  performed.   Surgical Approach and Conduit Harvest:  A median sternotomy incision was performed and the left internal mammary artery is dissected from the chest wall and prepared for bypass grafting. The left internal mammary artery is notably good quality conduit. Simultaneously, the greater saphenous vein is obtained from the patient's right thigh using endoscopic vein harvest technique. The saphenous vein is notably good quality conduit. After removal of the saphenous vein, the small surgical incisions in the lower extremity are closed with absorbable suture. Following systemic heparinization, the left internal mammary artery was transected distally noted to have excellent flow.   Extracorporeal Cardiopulmonary Bypass and Myocardial Protection:  The pericardium is opened. The ascending aorta is nondiseased in appearance. The ascending aorta and the right atrium are cannulated for cardiopulmonary bypass.  Adequate heparinization is verified.   A retrograde cardioplegia cannula is placed through the right atrium into the coronary sinus. The entire pre-bypass portion of the operation was notable for stable hemodynamics.  Cardiopulmonary bypass was begun and the surface of the heart is inspected. Distal target vessels are selected for coronary artery bypass grafting. A cardioplegia cannula is placed in the ascending aorta.   The patient is allowed to cool passively to 34 C systemic temperature.  The aortic cross clamp is applied and cold blood cardioplegia is delivered initially in an antegrade fashion through the aortic root.   Supplemental cardioplegia is given retrograde through the coronary sinus catheter.  Iced saline slush is applied for topical hypothermia.  The initial cardioplegic arrest is rapid with early diastolic arrest.  Repeat doses of cardioplegia are administered intermittently throughout the entire cross clamp portion of the operation through the aortic root, through the coronary  sinus catheter, and through subsequently placed vein grafts in  order to maintain completely flat electrocardiogram.  Coronary Artery Bypass Grafting:   The 1st diagonal branch of the left anterior descending coronary artery was grafted using a reversed saphenous vein graft in an end-to-side fashion.  At the site of distal anastomosis the target vessel was fair quality and measured approximately 1.5 mm in diameter.  The  right posterolateral branch of the right coronary artery was grafted using a reversed saphenous vein graft in an end-to-side fashion.  At the site of distal anastomosis the target vessel was fair quality and measured approximately 1.5 mm in diameter. Next, the 1st  obtuse marginal branch of the left circumflex coronary artery was grafted  in an side-to-side fashion to create a sequenced graft configuration with the Right PLA.  At the site of distal anastomosis the target vessel was fair quality and measured approximately 1.5 mm in diameter..  The distal left anterior coronary artery was grafted with the left internal mammary artery in an end-to-side fashion.  At the site of distal anastomosis the target vessel was fair quality and measured approximately 1.5 mm in diameter.  All proximal vein graft anastomoses were placed directly to the ascending aorta prior to removal of the aortic cross clamp.  The septal myocardial temperature rose rapidly after reperfusion of the left internal mammary artery graft. Deairing procedures were performed and the aortic cross clamp was removed  Procedure Completion:  All proximal and distal coronary anastomoses were inspected for hemostasis and appropriate graft orientation. Epicardial pacing wires are fixed to the right ventricular outflow tract and to the right atrial appendage. The patient is rewarmed to 37C temperature. The patient is weaned and disconnected from cardiopulmonary bypass.  The patient's rhythm at separation from bypass was NSR.  The  patient was weaned from cardiopulmonary bypass without any inotropic support.   Followup transesophageal echocardiogram performed after separation from bypass revealed no changes from the preoperative exam.  The aortic and venous cannula were removed uneventfully. Protamine was administered to reverse the anticoagulation. The mediastinum and pleural space were inspected for hemostasis and irrigated with saline solution. The mediastinum and left pleural space were drained using fluted chest tubes placed through separate stab incisions inferiorly.  The soft tissues anterior to the aorta were reapproximated loosely. The sternum is closed with double strength sternal wire. The soft tissues anterior to the sternum were closed in multiple layers and the skin is closed with a running subcuticular skin closure.  The post-bypass portion of the operation was notable for stable rhythm and hemodynamics.  No blood products were administered during the operation.   Disposition:  The patient tolerated the procedure well and is transported to the surgical intensive care in stable condition. There are no intraoperative complications. All sponge instrument and needle counts are verified correct at completion of the operation.    Brantley Fling, MD 09/18/2019 10:42 AM

## 2019-09-18 NOTE — Progress Notes (Signed)
      [address]       [place] [address]             [phone]      POD # 1 CABG x 4 Up in chair  BP 106/62   Pulse 72   Temp 100.2 F (37.9 C)   Resp (!) 29   Ht 5\' 6"  (1.676 m)   Wt 102.6 kg   SpO2 97%   BMI 36.51 kg/m  3L [place] 97% sat  Intake/Output Summary (Last 24 hours) at 09/18/2019 1644 Last data filed at 09/18/2019 1100 Gross per 24 hour  Intake 4028.14 ml  Output 2990 ml  Net 1038.14 ml   Pm labs pending  11/16/2019 C. Viviann Spare, MD [facility] [phone]

## 2019-09-18 NOTE — Plan of Care (Signed)

## 2019-09-18 NOTE — Progress Notes (Signed)
Patient refused CPAP for the night. Patient wearing oxygen set at 3lpm with Sp02=98%  

## 2019-09-18 NOTE — Procedures (Signed)
Extubation Procedure Note  Patient Details:   Name: Brian Chapman DOB: 08-22-1945 MRN: [id number]   Airway Documentation:    Vent end date: 09/18/19 Vent end time: 0107   Evaluation  O2 sats: stable throughout Complications: No apparent complications Patient did tolerate procedure well. Bilateral Breath Sounds: Clear   Yes   Patient extubated to 4L [place] with humidity. Patient able to speak. No stridor noted. Patient tolerated well.   VC: 700 NIF: -40  Brian Chapman 09/18/2019, 1:40 AM

## 2019-09-18 NOTE — Progress Notes (Signed)
1 Day Post-Op Procedure(s) (LRB): CORONARY ARTERY BYPASS GRAFTING (CABG), ON PUMP, TIMES FOUR, USING LEFT INTERNAL MAMMARY ARTERY AND RIGHT GREATER SAPHENOUS VEIN HARVESTED ENDOSCOPICALLY (N/A) TRANSESOPHAGEAL ECHOCARDIOGRAM (TEE) (N/A) Subjective: Incisional pain  Objective: Vital signs in last 24 hours: Temp:  [98 F (36.7 C)-102.2 F (39 C)] 100.2 F (37.9 C) (02/08 0700) Pulse Rate:  [62-106] 93 (02/08 0700) Cardiac Rhythm: A-V Sequential paced (02/08 0140) Resp:  [7-37] 20 (02/08 0700) BP: (116-167)/(51-88) 116/51 (02/07 2334) SpO2:  [75 %-100 %] 96 % (02/08 0700) Arterial Line BP: (100-267)/(42-261) 112/42 (02/08 0700) FiO2 (%):  [40 %-50 %] 40 % (02/08 0015) Weight:  [102.6 kg] 102.6 kg (02/08 0454)  Hemodynamic parameters for last 24 hours: PAP: (21-43)/(10-29) 26/18 CVP:  [11 mmHg-24 mmHg] 12 mmHg CO:  [3.6 L/min-5 L/min] 5 L/min CI:  [1.8 L/min/m2-2.5 L/min/m2] 2.5 L/min/m2  Intake/Output from previous day: 02/07 0701 - 02/08 0700 In: 5243.6 [I.V.:3176.8; Blood:500; NG/GT:100; IV Piggyback:1446.7] Out: 4720 [Urine:3620; Blood:700; Chest Tube:400] Intake/Output this shift: No intake/output data recorded.  General appearance: alert and cooperative Neurologic: intact Heart: regular rate and rhythm, S1, S2 normal, no murmur, click, rub or gallop Lungs: clear to auscultation bilaterally Abdomen: soft, non-tender; bowel sounds normal; no masses,  no organomegaly Extremities: edema 1+ Wound: c/d/i  Lab Results: Recent Labs    09/17/19 1923 09/17/19 1925 09/18/19 0209 09/18/19 0218  WBC 13.3*  --  13.8*  --   HGB 11.9*   < > 12.1* 11.9*  HCT 36.7*   < > 36.5* 35.0*  PLT 128*  --  137*  --    < > = values in this interval not displayed.   BMET:  Recent Labs    09/17/19 0502 09/17/19 1423 09/17/19 1925 09/17/19 1943 09/18/19 0209 09/18/19 0218  NA 137   < > 140   < > 135 139  K 3.9   < > 4.1   < > 4.3 4.4  CL 105   < > 106  --  106  --   CO2 20*  --    --   --  21*  --   GLUCOSE 120*   < > 102*  --  126*  --   BUN 15   < > 12  --  13  --   CREATININE 0.93   < > 0.80  --  1.08  --   CALCIUM 8.5*  --   --   --  7.3*  --    < > = values in this interval not displayed.    PT/INR:  Recent Labs    09/17/19 1923  LABPROT 16.5*  INR 1.3*   ABG    Component Value Date/Time   PHART 7.302 (L) 09/18/2019 0218   HCO3 21.7 09/18/2019 0218   TCO2 23 09/18/2019 0218   ACIDBASEDEF 4.0 (H) 09/18/2019 0218   O2SAT 92.0 09/18/2019 0218   CBG (last 3)  Recent Labs    09/18/19 0357 09/18/19 0505 09/18/19 0700  GLUCAP 140* 144* 141*    Assessment/Plan: S/P Procedure(s) (LRB): CORONARY ARTERY BYPASS GRAFTING (CABG), ON PUMP, TIMES FOUR, USING LEFT INTERNAL MAMMARY ARTERY AND RIGHT GREATER SAPHENOUS VEIN HARVESTED ENDOSCOPICALLY (N/A) TRANSESOPHAGEAL ECHOCARDIOGRAM (TEE) (N/A) Mobilize discontinue pa catheter  PT/OT consult IABP removed Aspirin/plavix   LOS: 2 days    Linden Dolin 09/18/2019

## 2019-09-18 NOTE — Progress Notes (Signed)
ABG within parameters after on CPAP/PS. Pt passed mechanic assessment with RT.

## 2019-09-18 NOTE — Progress Notes (Signed)
Orthopedic Tech Progress Note Patient Details:  Brian Chapman 1946/02/27 [id number]  Ortho Devices Type of Ortho Device: Knee Immobilizer Ortho Device/Splint Location: rle Ortho Device/Splint Interventions: Ordered, Application, Adjustment   Post Interventions Patient Tolerated: Well Instructions Provided: Care of device, Adjustment of device   Trinna Post 09/18/2019, 4:39 AM

## 2019-09-18 NOTE — Progress Notes (Signed)
Progress Note  Patient Name: Brian Chapman Date of Encounter: 09/18/2019  Primary Cardiologist: Lorine Bears, MD   Subjective   Extubated overnight.  IABP removed this morning.  Doing well, reports chest soreness but otherwise no complaints.  Denies any dyspna   Inpatient Medications    Scheduled Meds: . acetaminophen  1,000 mg Oral Q6H   Or  . acetaminophen (TYLENOL) oral liquid 160 mg/5 mL  1,000 mg Per Tube Q6H  . aspirin EC  325 mg Oral Daily   Or  . aspirin  324 mg Per Tube Daily  . atorvastatin  80 mg Oral Daily  . bisacodyl  10 mg Oral Daily   Or  . bisacodyl  10 mg Rectal Daily  . Chlorhexidine Gluconate Cloth  6 each Topical Daily  . docusate sodium  200 mg Oral Daily  . mouth rinse  15 mL Mouth Rinse BID  . metoprolol tartrate  12.5 mg Oral BID   Or  . metoprolol tartrate  12.5 mg Per Tube BID  . [START ON 09/19/2019] pantoprazole  40 mg Oral Daily  . sodium chloride flush  3 mL Intravenous Q12H   Continuous Infusions: . sodium chloride    . sodium chloride    . sodium chloride 20 mL/hr at 09/17/19 1915  . cefUROXime (ZINACEF)  IV Stopped (09/17/19 2308)  . dexmedetomidine (PRECEDEX) IV infusion Stopped (09/18/19 0115)  . EPINEPHrine 4 mg in dextrose 5% 250 mL infusion (16 mcg/mL)    . insulin 1.3 mL/hr at 09/18/19 0700  . lactated ringers    . lactated ringers Stopped (09/17/19 1915)  . lactated ringers 20 mL/hr at 09/18/19 0700  . nitroGLYCERIN    . phenylephrine (NEO-SYNEPHRINE) Adult infusion     PRN Meds: sodium chloride, dextrose, lactated ringers, metoprolol tartrate, midazolam, morphine injection, ondansetron (ZOFRAN) IV, oxyCODONE, sodium chloride flush, traMADol   Vital Signs    Vitals:   09/18/19 0615 09/18/19 0630 09/18/19 0645 09/18/19 0700  BP:      Pulse: 94 93 94 93  Resp: 18 (!) 22 (!) 21 20  Temp: (!) 100.4 F (38 C) 100.2 F (37.9 C) 100.2 F (37.9 C) 100.2 F (37.9 C)  TempSrc:      SpO2: 94% 95% 97% 96%  Weight:       Height:        Intake/Output Summary (Last 24 hours) at 09/18/2019 0713 Last data filed at 09/18/2019 0700 Gross per 24 hour  Intake 5243.56 ml  Output 4720 ml  Net 523.56 ml   Last 3 Weights 09/18/2019 09/17/2019 09/16/2019  Weight (lbs) 226 lb 3.1 oz 220 lb 7.4 oz 205 lb 7.5 oz  Weight (kg) 102.6 kg 100 kg 93.2 kg      Telemetry    AV-paced at 90bpm - Personally Reviewed  ECG    Sinus rhythm with first degree AV block, rate 79, ST elevation in V1-3 - Personally Reviewed  Physical Exam   GEN: No acute distress.   Neck: JVD difficult to assess given body habitus Cardiac: RRR, no murmurs Respiratory: Clear to auscultation bilaterally. GI: Soft, nontender MS: No edema; No deformity. Neuro:  Nonfocal  Psych: Normal affect   Labs    High Sensitivity Troponin:   Recent Labs  Lab 09/16/19 0852  TROPONINIHS 16      Chemistry Recent Labs  Lab 09/16/19 1610 09/16/19 9604 09/17/19 0502 09/17/19 1542 09/17/19 1759 09/17/19 1759 09/17/19 1925 09/17/19 1943 09/18/19 0056 09/18/19 5409 09/18/19 8119  NA 139   < > 137   < > 140   < > 140   < > 139 135 139  K 4.3   < > 3.9   < > 4.4   < > 4.1   < > 4.4 4.3 4.4  CL 105   < > 105   < > 106  --  106  --   --  106  --   CO2 24  --  20*  --   --   --   --   --   --  21*  --   GLUCOSE 140*   < > 120*   < > 119*  --  102*  --   --  126*  --   BUN 24*   < > 15   < > 12  --  12  --   --  13  --   CREATININE 1.03   < > 0.93   < > 0.80  --  0.80  --   --  1.08  --   CALCIUM 9.0  --  8.5*  --   --   --   --   --   --  7.3*  --   GFRNONAA >60  --  >60  --   --   --   --   --   --  >60  --   GFRAA >60  --  >60  --   --   --   --   --   --  >60  --   ANIONGAP 10  --  12  --   --   --   --   --   --  8  --    < > = values in this interval not displayed.     Hematology Recent Labs  Lab 09/17/19 0502 09/17/19 1542 09/17/19 1709 09/17/19 1714 09/17/19 1923 09/17/19 1925 09/18/19 0056 09/18/19 0209 09/18/19 0218  WBC 10.9*   --   --   --  13.3*  --   --  13.8*  --   RBC 5.02  --   --   --  4.12*  --   --  4.08*  --   HGB 14.6   < > 10.2*   < > 11.9*   < > 11.9* 12.1* 11.9*  HCT 44.6   < > 31.7*   < > 36.7*   < > 35.0* 36.5* 35.0*  MCV 88.8  --   --   --  89.1  --   --  89.5  --   MCH 29.1  --   --   --  28.9  --   --  29.7  --   MCHC 32.7  --   --   --  32.4  --   --  33.2  --   RDW 14.0  --   --   --  13.9  --   --  14.0  --   PLT 209  --  155  --  128*  --   --  137*  --    < > = values in this interval not displayed.    BNPNo results for input(s): BNP, PROBNP in the last 168 hours.   DDimer No results for input(s): DDIMER in the last 168 hours.   Radiology    CT CHEST WO CONTRAST  Result Date: 09/16/2019 CLINICAL DATA:  Preoperative CABG EXAM: CT CHEST  WITHOUT CONTRAST TECHNIQUE: Multidetector CT imaging of the chest was performed following the standard protocol without IV contrast. COMPARISON:  None. FINDINGS: Cardiovascular: Scattered aortic atherosclerosis. Intraaortic balloon pump, tip in the proximal descending thoracic aorta. Normal heart size. Three-vessel coronary artery calcifications. No pericardial effusion. The left brachiocephalic vein lies 1.2 cm posterior to the manubrium. The tubular ascending thoracic aorta lies 3.5 cm posterior to the sternal body. The right ventricle lies 1.3 cm posterior to the lower sternal body. Mediastinum/Nodes: No enlarged mediastinal, hilar, or axillary lymph nodes. Thyroid gland, trachea, and esophagus demonstrate no significant findings. Lungs/Pleura: Lungs are clear. No pleural effusion or pneumothorax. Upper Abdomen: No acute abnormality. Hepatic steatosis. Musculoskeletal: No chest wall mass or suspicious bone lesions identified. IMPRESSION: 1.  No acute airspace abnormality. 2. The left brachiocephalic vein lies 1.2 cm posterior to the manubrium. The right ventricle lies 1.3 cm posterior to the lower sternal body. The tubular ascending thoracic aorta lies 3.5 cm  posterior to the sternal body. 3. Intraaortic balloon pump, tip in the proximal descending thoracic aorta. 4.  Coronary artery disease.  Aortic Atherosclerosis (ICD10-I70.0). 5.  Hepatic steatosis. Electronically Signed   By: Lauralyn Primes M.D.   On: 09/16/2019 19:11   CARDIAC CATHETERIZATION  Result Date: 09/16/2019  Prox RCA lesion is 40% stenosed.  Dist LAD lesion is 100% stenosed.  Post intervention, there is a 10% residual stenosis.  Balloon angioplasty was performed using a BALLOON MINITREK RX 2.0X15.  Mid LAD lesion is 70% stenosed.  Prox LAD lesion is 85% stenosed.  Lat 1st Diag lesion is 80% stenosed.  Dist LM to Ost LAD lesion is 90% stenosed.  Prox Cx to Mid Cx lesion is 30% stenosed.  The left ventricular systolic function is normal.  LV end diastolic pressure is moderately elevated.  1.  Severe distal left main stenosis with plaque rupture extending into the ostial LAD with occluded distal LAD likely due to embolization.  Mild to moderate proximal RCA stenosis.  Surgical targets include mid LAD, first diagonal and OM 3. 2.  Normal LV systolic function and moderately elevated left ventricular end-diastolic pressure. 3.  Successful balloon angioplasty to the distal LAD to establish TIMI-3 flow.  Cardiac catheterization was performed via the right radial artery. 4.  Successful intra-aortic balloon pump placement via the right common femoral artery. Recommendations: Given this the left main involvement, diffuse LAD disease and diabetic status, I think surgical revascularization with urgent/emergent CABG is indicated. The patient did not receive any oral antiplatelet medication other than aspirin. Continue unfractionated heparin. I discussed the case with Dr. [name].   DG Chest Port 1 View  Result Date: 09/17/2019 CLINICAL DATA:  Post CABG EXAM: PORTABLE CHEST 1 VIEW COMPARISON:  09/17/2019 FINDINGS: Changes of CABG. Endotracheal tube is 5 cm above the carina. Swan-Ganz catheter in the right  pulmonary artery. Left chest tube. No pneumothorax. Cardiomegaly. Low lung volumes with bibasilar atelectasis. IMPRESSION: Changes of CABG.  Support devices as above.  No pneumothorax. Cardiomegaly, bibasilar atelectasis. Electronically Signed   By: Charlett Nose M.D.   On: 09/17/2019 20:01   DG CHEST PORT 1 VIEW  Result Date: 09/17/2019 CLINICAL DATA:  Swan-Ganz catheter placement EXAM: PORTABLE CHEST 1 VIEW COMPARISON:  09/16/2019 and prior exams FINDINGS: A RIGHT IJ Swan-Ganz catheter is noted with tip overlying the region of the RIGHT main pulmonary artery. An intra-aortic balloon pump marker is again noted overlying the transverse aorta. There is no evidence of focal airspace disease, pulmonary edema, suspicious pulmonary  nodule/mass, pleural effusion, or pneumothorax. No acute bony abnormalities are identified. IMPRESSION: RIGHT IJ Swan-Ganz catheter with tip overlying the RIGHT main pulmonary artery. No pneumothorax. No other significant change. Electronically Signed   By: Harmon Pier M.D.   On: 09/17/2019 10:32   DG Chest Port 1 View  Result Date: 09/16/2019 CLINICAL DATA:  ST-elevation myocardial infarction EXAM: PORTABLE CHEST 1 VIEW COMPARISON:  Earlier today FINDINGS: New aortic balloon pump with marker at the arch. Stable cardiopericardial size. There is extensive artifact from defibrillator pads and EKG leads. There is no edema, consolidation, effusion, or pneumothorax. IMPRESSION: 1. New aortic balloon pump in unremarkable position. 2. Improved vascular congestion. Electronically Signed   By: Marnee Spring M.D.   On: 09/16/2019 14:20   DG Chest Portable 1 View  Result Date: 09/16/2019 CLINICAL DATA:  Myocardial infarction EXAM: PORTABLE CHEST 1 VIEW COMPARISON:  06/30/2019 FINDINGS: UPPER limits normal heart size and pulmonary vascular congestion noted. There is no evidence of focal airspace disease, pulmonary edema, suspicious pulmonary nodule/mass, pleural effusion, or pneumothorax. No  acute bony abnormalities are identified. IMPRESSION: UPPER limits normal heart size with pulmonary vascular congestion. Electronically Signed   By: Harmon Pier M.D.   On: 09/16/2019 09:38   ECHOCARDIOGRAM COMPLETE  Result Date: 09/16/2019   ECHOCARDIOGRAM REPORT   Patient Name:   Brian Chapman Date of Exam: 09/16/2019 Medical Rec #:  [id number]           Height:       66.0 in Accession #:    [id number]          Weight:       205.5 lb Date of Birth:  23-Dec-1945           BSA:          2.02 m Patient Age:    73 years            BP:           115/95 mmHg Patient Gender: M                   HR:           56 bpm. Exam Location:  Inpatient Procedure: 2D Echo and Intracardiac Opacification Agent Indications:    STEMI I21.3  History:        Patient has no prior history of Echocardiogram examinations.                 CAD; Risk Factors:Hypertension, Diabetes and Dyslipidemia.  Sonographer:    Ross Ludwig RDCS (AE) Referring Phys: [id number] [facility] ATKINS  Sonographer Comments: Patient is morbidly obese. Image acquisition challenging due to patient body habitus. IMPRESSIONS  1. Left ventricular ejection fraction, by visual estimation, is 45 to 50%. The left ventricle has hyperdynamic function. There is severely increased left ventricular hypertrophy.  2. Definity contrast agent was given IV to delineate the left ventricular endocardial borders.  3. The left ventricle demonstrates regional wall motion abnormalities.  4. Severe akinesis of the left ventricular, apical apical segment and inferoapical and distal anteroseptal/anteroapical walls. No apical thrombus.  5. Left ventricular diastolic parameters are consistent with Grade I diastolic dysfunction (impaired relaxation).  6. Global right ventricle has mildly reduced systolic function.The right ventricular size is normal. No increase in right ventricular wall thickness.  7. Hypokinetic right ventricular apex.  8. Left atrial size was normal.  9. Right atrial size was  normal. 10. Mild mitral annular calcification. 11. The  mitral valve is grossly normal. Trivial mitral valve regurgitation. 12. The tricuspid valve is grossly normal. 13. The tricuspid valve is grossly normal. Tricuspid valve regurgitation is trivial. 14. The aortic valve is tricuspid. Aortic valve regurgitation is not visualized. Mild aortic valve sclerosis without stenosis. 15. The pulmonic valve was grossly normal. Pulmonic valve regurgitation is not visualized. 16. The interatrial septum was not well visualized. FINDINGS  Left Ventricle: Left ventricular ejection fraction, by visual estimation, is 45 to 50%. The left ventricle has hyperdynamic function. Severe akinesis of the left ventricular, apical apical segment and inferoapical and distal anteroseptal/anteroapical walls. Definity contrast agent was given IV to delineate the left ventricular endocardial borders. The left ventricle demonstrates regional wall motion abnormalities. There is severely increased left ventricular hypertrophy. Left ventricular diastolic parameters are consistent with Grade I diastolic dysfunction (impaired relaxation). Indeterminate filling pressures. Right Ventricle: The right ventricular size is normal. No increase in right ventricular wall thickness. Global RV systolic function is has mildly reduced systolic function. The motion of the right ventricle apex hypokinetic. Left Atrium: Left atrial size was normal in size. Right Atrium: Right atrial size was normal in size Pericardium: There is no evidence of pericardial effusion. Mitral Valve: The mitral valve is grossly normal. Mild mitral annular calcification. Trivial mitral valve regurgitation. MV peak gradient, 3.9 mmHg. Tricuspid Valve: The tricuspid valve is grossly normal. Tricuspid valve regurgitation is trivial. Aortic Valve: The aortic valve is tricuspid. Aortic valve regurgitation is not visualized. Mild aortic valve sclerosis is present, with no evidence of aortic valve  stenosis. Aortic valve mean gradient measures 6.0 mmHg. Aortic valve peak gradient measures 11.3 mmHg. Aortic valve area, by VTI measures 2.87 cm. Pulmonic Valve: The pulmonic valve was grossly normal. Pulmonic valve regurgitation is not visualized. Pulmonic regurgitation is not visualized. Aorta: The aortic root and ascending aorta are structurally normal, with no evidence of dilitation. Venous: The inferior vena cava was not well visualized. IAS/Shunts: The interatrial septum was not well visualized.  LEFT VENTRICLE PLAX 2D LVIDd:         3.70 cm  Diastology LVIDs:         2.20 cm  LV e' lateral:   7.07 cm/s LV PW:         1.60 cm  LV E/e' lateral: 8.4 LV IVS:        1.80 cm  LV e' medial:    5.33 cm/s LVOT diam:     2.00 cm  LV E/e' medial:  11.2 LV SV:         42 ml LV SV Index:   19.80 LVOT Area:     3.14 cm  RIGHT VENTRICLE RV Basal diam:  3.00 cm RV S prime:     16.80 cm/s TAPSE (M-mode): 2.8 cm LEFT ATRIUM             Index       RIGHT ATRIUM           Index LA diam:        2.30 cm 1.14 cm/m  RA Area:     13.40 cm LA Vol (A2C):   53.7 ml 26.55 ml/m RA Volume:   28.30 ml  13.99 ml/m LA Vol (A4C):   64.5 ml 31.89 ml/m LA Biplane Vol: 63.9 ml 31.59 ml/m  AORTIC VALVE AV Area (Vmax):    2.60 cm AV Area (Vmean):   2.65 cm AV Area (VTI):     2.87 cm AV Vmax:  168.00 cm/s AV Vmean:          113.000 cm/s AV VTI:            0.341 m AV Peak Grad:      11.3 mmHg AV Mean Grad:      6.0 mmHg LVOT Vmax:         139.00 cm/s LVOT Vmean:        95.300 cm/s LVOT VTI:          0.311 m LVOT/AV VTI ratio: 0.91  AORTA Ao Root diam: 3.30 cm Ao Asc diam:  3.50 cm MITRAL VALVE MV Area (PHT): 3.03 cm             SHUNTS MV Peak grad:  3.9 mmHg             Systemic VTI:  0.31 m MV Mean grad:  1.0 mmHg             Systemic Diam: 2.00 cm MV Vmax:       0.99 m/s MV Vmean:      52.5 cm/s MV VTI:        0.34 m MV PHT:        72.50 msec MV Decel Time: 250 msec MV E velocity: 59.70 cm/s 103 cm/s MV A velocity: 81.00 cm/s  70.3 cm/s MV E/A ratio:  0.74       1.5  Zoila Shutter MD Electronically signed by Zoila Shutter MD Signature Date/Time: 09/16/2019/4:19:40 PM    Final    Doppler Pre CABG  Result Date: 09/17/2019 PREOPERATIVE VASCULAR EVALUATION  Risk Factors:     Coronary artery disease. Limitations:      Balloon pump Comparison Study: No prior study on file Performing Technologist: Sherren Kerns RVS  Examination Guidelines: A complete evaluation includes B-mode imaging, spectral Doppler, color Doppler, and power Doppler as needed of all accessible portions of each vessel. Bilateral testing is considered an integral part of a complete examination. Limited examinations for reoccurring indications may be performed as noted.  Right Carotid Findings: +----------+--------+--------+--------+--------+------------------+           PSV cm/sEDV cm/sStenosisDescribeComments           +----------+--------+--------+--------+--------+------------------+ CCA Prox  75                              intimal thickening +----------+--------+--------+--------+--------+------------------+ CCA Distal                                intimal thickening +----------+--------+--------+--------+--------+------------------+ ICA Prox  78                                                 +----------+--------+--------+--------+--------+------------------+ ICA Distal76                                                 +----------+--------+--------+--------+--------+------------------+ ECA       157                                                +----------+--------+--------+--------+--------+------------------+  Portions of this table do not appear on this page. +----------+--------+-------+------------+------------+           PSV cm/sEDV cmsDescribe    Arm Pressure +----------+--------+-------+------------+------------+ Subclavian               Not assessed              +----------+--------+-------+------------+------------+ +---------+--------+--+--------+ VertebralPSV cm/s39EDV cm/s +---------+--------+--+--------+ Left Carotid Findings: +----------+--------+--------+--------+-----------+------------------+           PSV cm/sEDV cm/sStenosisDescribe   Comments           +----------+--------+--------+--------+-----------+------------------+ CCA Prox  106                                intimal thickening +----------+--------+--------+--------+-----------+------------------+ CCA Distal96                                 intimal thickening +----------+--------+--------+--------+-----------+------------------+ ICA Prox  90                      homogeneous                   +----------+--------+--------+--------+-----------+------------------+ ICA Distal81                                                    +----------+--------+--------+--------+-----------+------------------+ ECA       139                                                   +----------+--------+--------+--------+-----------+------------------+ +----------+--------+--------+------------+------------+ SubclavianPSV cm/sEDV cm/sDescribe    Arm Pressure +----------+--------+--------+------------+------------+                           Not assessed             +----------+--------+--------+------------+------------+ +---------+--------+--+--------+ VertebralPSV cm/s44EDV cm/s +---------+--------+--+--------+  ABI Findings: +--------+------------------+-----+---------+--------+ Right   Rt Pressure (mmHg)IndexWaveform Comment  +--------+------------------+-----+---------+--------+ Brachial                       triphasic         +--------+------------------+-----+---------+--------+ +--------+------------------+-----+---------+-------+ Left    Lt Pressure (mmHg)IndexWaveform Comment +--------+------------------+-----+---------+-------+ Brachial                        triphasic        +--------+------------------+-----+---------+-------+  Right Doppler Findings: +-----------+--------+-----+---------+-----------------------------------------+ Site       PressureIndexDoppler  Comments                                  +-----------+--------+-----+---------+-----------------------------------------+ Brachial                triphasic                                          +-----------+--------+-----+---------+-----------------------------------------+ Radial  triphasic                                          +-----------+--------+-----+---------+-----------------------------------------+ Ulnar                   triphasic                                          +-----------+--------+-----+---------+-----------------------------------------+ Palmar Arch                      Doppler reverses with radial compression                                   remains normal with ulnar compression     +-----------+--------+-----+---------+-----------------------------------------+  Left Doppler Findings: +--------+--------+-----+---------+--------+ Site    PressureIndexDoppler  Comments +--------+--------+-----+---------+--------+ Brachial             triphasic         +--------+--------+-----+---------+--------+ Radial               triphasic         +--------+--------+-----+---------+--------+ Ulnar                triphasic         +--------+--------+-----+---------+--------+  Summary: Right Carotid: The extracranial vessels were near-normal with only minimal wall                thickening or plaque. Left Carotid: The extracranial vessels were near-normal with only minimal wall               thickening or plaque. Vertebrals:  Bilateral vertebral arteries demonstrate antegrade flow. Subclavians: Not assessed. Right Upper Extremity: Doppler waveforms remain within normal limits with right radial  compression. Doppler waveforms decrease <50% with right ulnar compression. Left Upper Extremity: Doppler waveforms remain within normal limits with left radial compression. Doppler waveforms remain within normal limits with left ulnar compression.  Electronically signed by Fabienne Bruns MD on 09/17/2019 at 11:29:54 AM.    Final     Cardiac Studies   Cath 09/16/19:  1. Severe distal left main stenosis with plaque rupture extending into the ostial LAD with occluded distal LAD likely due to embolization. Mild to moderate proximal RCA stenosis. Surgical targets include mid LAD, first diagonal and OM 3. 2. Normal LV systolic function and moderately elevated left ventricular end-diastolic pressure. 3. Successful balloon angioplasty to the distal LAD to establish TIMI-3 flow. Cardiac catheterization was performed via the right radial artery. 4. Successful intra-aortic balloon pump placement via the right common femoral artery.  Recommendations: Given this the left main involvement, diffuse LAD disease and diabetic status, I think surgical revascularization with urgent/emergent CABG is indicated. The patient did not receive any oral antiplatelet medication other than aspirin. Continue unfractionated heparin. I discussed the case with Dr. [name].  TTE 09/16/19: 1. Left ventricular ejection fraction, by visual estimation, is 45 to  50%. The left ventricle has hyperdynamic function. There is severely  increased left ventricular hypertrophy.  2. Definity contrast agent was given IV to delineate the left ventricular  endocardial borders.  3. The left ventricle demonstrates regional wall motion abnormalities.  4. Severe akinesis of the left ventricular, apical apical  segment and  inferoapical and distal anteroseptal/anteroapical walls. No apical  thrombus.  5. Left ventricular diastolic parameters are consistent with Grade I  diastolic dysfunction (impaired relaxation).  6. Global right  ventricle has mildly reduced systolic function.The right  ventricular size is normal. No increase in right ventricular wall  thickness.  7. Hypokinetic right ventricular apex.  8. Left atrial size was normal.  9. Right atrial size was normal.  10. Mild mitral annular calcification.  11. The mitral valve is grossly normal. Trivial mitral valve  regurgitation.  12. The tricuspid valve is grossly normal.  13. The tricuspid valve is grossly normal. Tricuspid valve regurgitation  is trivial.  14. The aortic valve is tricuspid. Aortic valve regurgitation is not  visualized. Mild aortic valve sclerosis without stenosis.  15. The pulmonic valve was grossly normal. Pulmonic valve regurgitation is  not visualized.  16. The interatrial septum was not well visualized.    Patient Profile     74 y.o. male with CAD, T2DM, HTN, HLD presented with STEMI to [facility] on 09/16/19.  Cath showed severe distal left main stenosis with plaque rupture extending into ostial LAD and occluded distal LAD likely from embolization.  Balloon angioplasty to distal LAD was done and patient was transferred to [facility] for emergent CABG.  Underwent CABG x4 (LIMA-LAD, SVG-Diag, SVG-OM1->OM2) with Dr [name] on 2/7.  Assessment & Plan    CAD: s/p STEMI, found to have severe distal left main stenosis with plaque rupture extending into ostial LAD and occluded distal LAD likely from embolization. S/p POBA of distal LAD, and then underwent  CABG x4 (LIMA-LAD, SVG-Diag, SVG-OM1->OM2) with Dr [name] on 2/7.  TTE shows EF 45-50%.  Recovering well, extubated and IABP removed today.  Continue ASA, atorvastatin, metoprolol  For questions or updates, please contact [facility] Please consult [email or web address] for contact info under        Signed, Little Ishikawa, MD  09/18/2019, 7:13 AM

## 2019-09-18 NOTE — Progress Notes (Signed)
Patient decided he wanted to wear CPAP for the night. Placed patient on CPAP at 12cm and oxygen set at 3lpm.

## 2019-09-18 NOTE — Significant Event (Signed)
Pt extubated with RT  Tiffany and this RN per rapid wean protocol. Now on 4L [place]. Able to cough and voice name on command. Will draw an ABG in one hour.

## 2019-09-19 ENCOUNTER — Inpatient Hospital Stay (HOSPITAL_COMMUNITY): Payer: No Typology Code available for payment source

## 2019-09-19 DIAGNOSIS — Z4682 Encounter for fitting and adjustment of non-vascular catheter: Secondary | ICD-10-CM | POA: Diagnosis not present

## 2019-09-19 LAB — BASIC METABOLIC PANEL
Anion gap: 10 (ref 5–15)
BUN: 19 mg/dL (ref 8–23)
CO2: 24 mmol/L (ref 22–32)
Calcium: 7.8 mg/dL — ABNORMAL LOW (ref 8.9–10.3)
Chloride: 103 mmol/L (ref 98–111)
Creatinine, Ser: 1.13 mg/dL (ref 0.61–1.24)
GFR calc Af Amer: >60 mL/min (ref >60)
GFR calc non Af Amer: >60 mL/min (ref >60)
Glucose, Bld: 133 mg/dL — ABNORMAL HIGH (ref 70–99)
Potassium: 3.8 mmol/L (ref 3.5–5.1)
Sodium: 137 mmol/L (ref 135–145)

## 2019-09-19 LAB — CBC
HCT: 31.5 % — ABNORMAL LOW (ref 39.0–52.0)
Hemoglobin: 10 g/dL — ABNORMAL LOW (ref 13.0–17.0)
MCH: 29.2 pg (ref 26.0–34.0)
MCHC: 31.7 g/dL (ref 30.0–36.0)
MCV: 92.1 fL (ref 80.0–100.0)
Platelets: 104 10*3/uL — ABNORMAL LOW (ref 150–400)
RBC: 3.42 MIL/uL — ABNORMAL LOW (ref 4.22–5.81)
RDW: 14.3 % (ref 11.5–15.5)
WBC: 14.2 10*3/uL — ABNORMAL HIGH (ref 4.0–10.5)
nRBC: 0 % (ref 0.0–0.2)

## 2019-09-19 LAB — GLUCOSE, CAPILLARY
Glucose-Capillary: 107 mg/dL — ABNORMAL HIGH (ref 70–99)
Glucose-Capillary: 111 mg/dL — ABNORMAL HIGH (ref 70–99)
Glucose-Capillary: 118 mg/dL — ABNORMAL HIGH (ref 70–99)
Glucose-Capillary: 122 mg/dL — ABNORMAL HIGH (ref 70–99)
Glucose-Capillary: 124 mg/dL — ABNORMAL HIGH (ref 70–99)
Glucose-Capillary: 126 mg/dL — ABNORMAL HIGH (ref 70–99)
Glucose-Capillary: 128 mg/dL — ABNORMAL HIGH (ref 70–99)
Glucose-Capillary: 133 mg/dL — ABNORMAL HIGH (ref 70–99)
Glucose-Capillary: 137 mg/dL — ABNORMAL HIGH (ref 70–99)
Glucose-Capillary: 140 mg/dL — ABNORMAL HIGH (ref 70–99)
Glucose-Capillary: 163 mg/dL — ABNORMAL HIGH (ref 70–99)
Glucose-Capillary: 95 mg/dL (ref 70–99)

## 2019-09-19 MED ORDER — CALCIUM CARBONATE ANTACID 500 MG PO CHEW
1.0000 | CHEWABLE_TABLET | Freq: Every day | ORAL | Status: DC
  Administered 2019-09-19 – 2019-09-21 (×3): 200 mg via ORAL
  Filled 2019-09-19 (×3): qty 1

## 2019-09-19 MED ORDER — AMIODARONE HCL IN DEXTROSE 360-4.14 MG/200ML-% IV SOLN
30.0000 mg/h | INTRAVENOUS | Status: DC
  Administered 2019-09-20 (×2): 30 mg/h via INTRAVENOUS
  Filled 2019-09-19: qty 200

## 2019-09-19 MED ORDER — AMIODARONE HCL IN DEXTROSE 360-4.14 MG/200ML-% IV SOLN
60.0000 mg/h | INTRAVENOUS | Status: DC
  Administered 2019-09-19 (×2): 60 mg/h via INTRAVENOUS
  Filled 2019-09-19: qty 200

## 2019-09-19 MED ORDER — PANTOPRAZOLE SODIUM 40 MG IV SOLR
40.0000 mg | Freq: Two times a day (BID) | INTRAVENOUS | Status: AC
  Administered 2019-09-19 (×2): 40 mg via INTRAVENOUS
  Filled 2019-09-19 (×2): qty 40

## 2019-09-19 MED ORDER — INSULIN DETEMIR 100 UNIT/ML ~~LOC~~ SOLN
7.0000 [IU] | Freq: Every day | SUBCUTANEOUS | Status: DC
  Administered 2019-09-19: 16:00:00 7 [IU] via SUBCUTANEOUS
  Filled 2019-09-19 (×2): qty 0.07

## 2019-09-19 MED ORDER — AMIODARONE HCL IN DEXTROSE 360-4.14 MG/200ML-% IV SOLN
INTRAVENOUS | Status: AC
  Administered 2019-09-19: 18:00:00 150 mg via INTRAVENOUS
  Filled 2019-09-19: qty 400

## 2019-09-19 MED ORDER — FUROSEMIDE 10 MG/ML IJ SOLN
40.0000 mg | Freq: Once | INTRAMUSCULAR | Status: AC
  Administered 2019-09-19: 40 mg via INTRAVENOUS
  Filled 2019-09-19: qty 4

## 2019-09-19 MED ORDER — AMIODARONE LOAD VIA INFUSION
150.0000 mg | Freq: Once | INTRAVENOUS | Status: AC
  Filled 2019-09-19: qty 83.34

## 2019-09-19 MED ORDER — COLCHICINE 0.6 MG PO TABS
0.6000 mg | ORAL_TABLET | Freq: Every day | ORAL | Status: DC
  Administered 2019-09-19 – 2019-09-21 (×3): 0.6 mg via ORAL
  Filled 2019-09-19 (×3): qty 1

## 2019-09-19 MED ORDER — INSULIN ASPART 100 UNIT/ML ~~LOC~~ SOLN
0.0000 [IU] | SUBCUTANEOUS | Status: DC
  Administered 2019-09-19: 20:00:00 4 [IU] via SUBCUTANEOUS
  Administered 2019-09-20 (×2): 2 [IU] via SUBCUTANEOUS
  Administered 2019-09-20: 08:00:00 4 [IU] via SUBCUTANEOUS

## 2019-09-19 MED FILL — Potassium Chloride Inj 2 mEq/ML: INTRAVENOUS | Qty: 40 | Status: AC

## 2019-09-19 MED FILL — Magnesium Sulfate Inj 50%: INTRAMUSCULAR | Qty: 10 | Status: AC

## 2019-09-19 MED FILL — Heparin Sodium (Porcine) Inj 1000 Unit/ML: INTRAMUSCULAR | Qty: 30 | Status: AC

## 2019-09-19 NOTE — Evaluation (Signed)
Occupational Therapy Evaluation Patient Details Name: Brian Chapman MRN: [id number] DOB: Mar 09, 1946 Today's Date: 09/19/2019    History of Present Illness Patient is a 74 y/o male who presents with chest pain, found to have STEMI, now s/p CABG x4 on 09/17/19. PMH includes HTN, HLD, DM, OSA on CPAP.   Clinical Impression   PTA, pt was living with his wife and was independent and working. Pt currently requiring Min A for UB ADLs, Mod A for LB ADLs, and Min A for functional mobility with RW. Pt presenting with decreased strength, balance, and activity tolerance. Providing education on sternal precautions and initiated education on compensatory techniques. Pt would benefit from further acute OT to facilitate safe dc. Recommend dc to home once medically stable per physician.      Follow Up Recommendations  No OT follow up;Supervision/Assistance - 24 hour    Equipment Recommendations  Tub/shower seat    Recommendations for Other Services PT consult     Precautions / Restrictions Precautions Precautions: Fall;Sternal Precaution Booklet Issued: Yes (comment) Precaution Comments: Reviewed sternal precautions Restrictions Weight Bearing Restrictions: (sternal precautions)      Mobility Bed Mobility Overal bed mobility: Needs Assistance Bed Mobility: Rolling;Sit to Sidelying Rolling: Min guard       Sit to sidelying: Min guard General bed mobility comments: Min Guard A for safety  Transfers Overall transfer level: Needs assistance Equipment used: None;Rolling walker (2 wheeled) Transfers: Sit to/from Stand Sit to Stand: Min assist;Min guard         General transfer comment: Cues for sternal precautions. Min A to steady in standing.     Balance Overall balance assessment: Needs assistance Sitting-balance support: No upper extremity supported;Feet supported Sitting balance-Leahy Scale: Good     Standing balance support: Bilateral upper extremity supported Standing  balance-Leahy Scale: Fair                             ADL either performed or assessed with clinical judgement   ADL Overall ADL's : Needs assistance/impaired Eating/Feeding: Set up;Sitting   Grooming: Set up;Sitting;Supervision/safety   Upper Body Bathing: Minimal assistance;Sitting   Lower Body Bathing: Minimal assistance;Sit to/from stand   Upper Body Dressing : Minimal assistance;Sitting   Lower Body Dressing: Moderate assistance;Sit to/from stand Lower Body Dressing Details (indicate cue type and reason): Pt reporting that prior to sx he was able to bring ankles to knees Toilet Transfer: Minimal assistance;Ambulation;RW(simulated in room)           Functional mobility during ADLs: Minimal assistance;Rolling walker General ADL Comments: Initiated education on sternal precautions and compensatory techniques     Vision         Perception     Praxis      Pertinent Vitals/Pain Pain Assessment: 0-10 Pain Score: 6  Pain Descriptors / Indicators: Constant;Discomfort Pain Intervention(s): Monitored during session;Repositioned     Hand Dominance     Extremity/Trunk Assessment Upper Extremity Assessment Upper Extremity Assessment: Overall WFL for tasks assessed   Lower Extremity Assessment Lower Extremity Assessment: Overall WFL for tasks assessed   Cervical / Trunk Assessment Cervical / Trunk Assessment: Normal   Communication Communication Communication: HOH   Cognition Arousal/Alertness: Awake/alert Behavior During Therapy: WFL for tasks assessed/performed Overall Cognitive Status: Within Functional Limits for tasks assessed  General Comments  VSS throughout on RA    Exercises     Shoulder Instructions      Home Living Family/patient expects to be discharged to:: Private residence Living Arrangements: Spouse/significant other Available Help at Discharge: Family;Available 24  hours/day Type of Home: House Home Access: Stairs to enter [employer] of Steps: a few Entrance Stairs-Rails: None Home Layout: One level     Bathroom Shower/Tub: Chief Strategy Officer: Standard     Home Equipment: None          Prior Functioning/Environment Level of Independence: Independent        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        OT Problem List: Decreased range of motion;Decreased strength;Decreased activity tolerance;Impaired balance (sitting and/or standing);Decreased knowledge of use of DME or AE;Decreased knowledge of precautions      OT Treatment/Interventions: Self-care/ADL training;Therapeutic exercise;Energy conservation;DME and/or AE instruction;Therapeutic activities;Patient/family education;Balance training    OT Goals(Current goals can be found in the care plan section) Acute Rehab OT Goals Patient Stated Goal: get back to home, independence, and back to work OT Goal Formulation: With patient Time For Goal Achievement: 10/03/19 Potential to Achieve Goals: Good  OT Frequency: Min 2X/week   Barriers to D/C:            Co-evaluation              AM-PAC OT "6 Clicks" Daily Activity     Outcome Measure Help from another person eating meals?: None Help from another person taking care of personal grooming?: A Little Help from another person toileting, which includes using toliet, bedpan, or urinal?: A Little Help from another person bathing (including washing, rinsing, drying)?: A Little Help from another person to put on and taking off regular upper body clothing?: A Little Help from another person to put on and taking off regular lower body clothing?: A Lot 6 Click Score: 18   End of Session Equipment Utilized During Treatment: Rolling walker Nurse Communication: Mobility status  Activity Tolerance: Patient tolerated treatment well Patient left: in bed;with call bell/phone within  reach  OT Visit Diagnosis: Unsteadiness on feet (R26.81);Other abnormalities of gait and mobility (R26.89);Muscle weakness (generalized) (M62.81)                Time: 0623-7628 OT Time Calculation (min): 13 min Charges:  OT General Charges $OT Visit: 1 Visit OT Evaluation $OT Eval Moderate Complexity: 1 Mod  Ebelyn Bohnet MSOT, OTR/L Acute Rehab Pager: [phone] Office: [phone]  Theodoro Grist Merrick Maggio 09/19/2019, 1:12 PM

## 2019-09-19 NOTE — Plan of Care (Signed)
°  Problem: Education: °Goal: Knowledge of General Education information will improve °Description: Including pain rating scale, medication(s)/side effects and non-pharmacologic comfort measures °Outcome: Progressing °  °Problem: Health Behavior/Discharge Planning: °Goal: Ability to manage health-related needs will improve °Outcome: Progressing °  °Problem: Clinical Measurements: °Goal: Ability to maintain clinical measurements within normal limits will improve °Outcome: Progressing °Goal: Will remain free from infection °Outcome: Progressing °Goal: Diagnostic test results will improve °Outcome: Progressing °Goal: Respiratory complications will improve °Outcome: Progressing °Goal: Cardiovascular complication will be avoided °Outcome: Progressing °  °Problem: Coping: °Goal: Level of anxiety will decrease °Outcome: Progressing °  °Problem: Nutrition: °Goal: Adequate nutrition will be maintained °Outcome: Progressing °  °

## 2019-09-19 NOTE — Progress Notes (Signed)
Patient refused CPAP for the night  

## 2019-09-19 NOTE — Progress Notes (Signed)
  Amiodarone Drug - Drug Interaction Consult Note  Recommendations: Monitor for muscle pain/weakness while on atorvastatin; monitor for bradycardia, AV block, myocardial depression while on metoprolol; monitor for QTc prolongation while on metoclopramide, ondansetron  Amiodarone is metabolized by the cytochrome P450 system and therefore has the potential to cause many drug interactions. Amiodarone has an average plasma half-life of 50 days (range 20 to 100 days).   There is potential for drug interactions to occur several weeks or months after stopping treatment and the onset of drug interactions may be slow after initiating amiodarone.   [x]  Statins: Increased risk of myopathy. Simvastatin- restrict dose to 20mg  daily. Other statins: counsel patients to report any muscle pain or weakness immediately.  []  Anticoagulants: Amiodarone can increase anticoagulant effect. Consider warfarin dose reduction. Patients should be monitored closely and the dose of anticoagulant altered accordingly, remembering that amiodarone levels take several weeks to stabilize.  []  Antiepileptics: Amiodarone can increase plasma concentration of phenytoin, the dose should be reduced. Note that small changes in phenytoin dose can result in large changes in levels. Monitor patient and counsel on signs of toxicity.  [x]  Beta blockers: increased risk of bradycardia, AV block and myocardial depression. Sotalol - avoid concomitant use.  []   Calcium channel blockers (diltiazem and verapamil): increased risk of bradycardia, AV block and myocardial depression.  []   Cyclosporine: Amiodarone increases levels of cyclosporine. Reduced dose of cyclosporine is recommended.  []  Digoxin dose should be halved when amiodarone is started.  []  Diuretics: increased risk of cardiotoxicity if hypokalemia occurs.  []  Oral hypoglycemic agents (glyburide, glipizide, glimepiride): increased risk of hypoglycemia. Patient's glucose levels should  be monitored closely when initiating amiodarone therapy.   [x]  Drugs that prolong the QT interval:  Torsades de pointes risk may be increased with concurrent use - avoid if possible.  Monitor QTc, also keep magnesium/potassium WNL if concurrent therapy can't be avoided. Antibiotics: e.g. fluoroquinolones, erythromycin. . Antiarrhythmics: e.g. quinidine, procainamide, disopyramide, sotalol. . Antipsychotics: e.g. phenothiazines, haloperidol.  . Lithium, tricyclic antidepressants, and methadone.   Metoclopramide, ondansetron  Thank You,  , PharmD, BCPS, [facility] Clinical Pharmacist 09/19/2019 6:03 PM

## 2019-09-19 NOTE — Progress Notes (Signed)
Inpatient Diabetes Program Recommendations  AACE/ADA: New Consensus Statement on Inpatient Glycemic Control (2015)  Target Ranges:  Prepandial:   less than 140 mg/dL      Peak postprandial:   less than 180 mg/dL (1-2 hours)      Critically ill patients:  140 - 180 mg/dL   Lab Results  Component Value Date   GLUCAP 140 (H) 09/19/2019    Review of Glycemic Control  Results for Brian Chapman, Brian Chapman (MRN [id number]) as of 09/19/2019 12:08  Ref. Range 09/19/2019 02:35 09/19/2019 03:28 09/19/2019 04:30 09/19/2019 06:50 09/19/2019 09:03  Glucose-Capillary Latest Ref Range: 70 - 99 mg/dL 916 (H) 384 (H) 665 (H) 137 (H) 140 (H)    Diabetes history: DM2 Outpatient Diabetes medications: Metformin 500 mg with breakfast Current orders for Inpatient glycemic control: IV Insulin  Inpatient Diabetes Program Recommendations:    When MD is ready to transition to SQ insulin please consider  -Levemir 7 units BID daily (0.15/kg) -Novolog 0-9 TID with meals + 0-5 units at bedtime -Please order current A1C  Thank you, Dulce Sellar, RN, BSN Diabetes Coordinator Inpatient Diabetes Program [phone] (team pager from 8a-5p)

## 2019-09-19 NOTE — Progress Notes (Signed)
EVENING ROUNDS NOTE :     [address]       [place] [address]             [phone]                 2 Days Post-Op Procedure(s) (LRB): CORONARY ARTERY BYPASS GRAFTING (CABG), ON PUMP, TIMES FOUR, USING LEFT INTERNAL MAMMARY ARTERY AND RIGHT GREATER SAPHENOUS VEIN HARVESTED ENDOSCOPICALLY (N/A) TRANSESOPHAGEAL ECHOCARDIOGRAM (TEE) (N/A)   Total Length of Stay:  LOS: 3 days  Events:  afib this evening - starting amio Doing well otherwise    BP 97/60   Pulse 73   Temp 98.5 F (36.9 C) (Oral)   Resp 20   Ht 5\' 6"  (1.676 m)   Wt 104.5 kg   SpO2 97%   BMI 37.18 kg/m         . sodium chloride    . sodium chloride    . sodium chloride 20 mL/hr at 09/19/19 0300  . lactated ringers    . lactated ringers Stopped (09/17/19 1915)  . lactated ringers 20 mL/hr at 09/19/19 1600  . phenylephrine (NEO-SYNEPHRINE) Adult infusion      I/O last 3 completed shifts: In: 3680.1 [I.V.:2163.4; Other:20; NG/GT:100; IV Piggyback:1396.8] Out: 3075 [Urine:2315; Chest Tube:760]   CBC Latest Ref Rng & Units 09/19/2019 09/18/2019 09/18/2019  WBC 4.0 - 10.5 K/uL 14.2(H) 15.4(H) -  Hemoglobin 13.0 - 17.0 g/dL 10.0(L) 10.3(L) 11.9(L)  Hematocrit 39.0 - 52.0 % 31.5(L) 32.5(L) 35.0(L)  Platelets 150 - 400 K/uL 104(L) 130(L) -    BMP Latest Ref Rng & Units 09/19/2019 09/18/2019 09/18/2019  Glucose 70 - 99 mg/dL 11/16/2019) 086(V) -  BUN 8 - 23 mg/dL 19 19 -  Creatinine 784(O - 1.24 mg/dL 9.62 9.52 -  Sodium 8.41 - 145 mmol/L 137 136 139  Potassium 3.5 - 5.1 mmol/L 3.8 4.0 4.4  Chloride 98 - 111 mmol/L 103 104 -  CO2 22 - 32 mmol/L 24 22 -  Calcium 8.9 - 10.3 mg/dL 7.8(L) 7.6(L) -    ABG    Component Value Date/Time   PHART 7.302 (L) 09/18/2019 0218   PCO2ART 44.7 09/18/2019 0218   PO2ART 75.0 (L) 09/18/2019 0218   HCO3 21.7 09/18/2019 0218   TCO2 23 09/18/2019 0218   ACIDBASEDEF 4.0 (H) 09/18/2019 0218   O2SAT 92.0 09/18/2019 11/16/2019       4010, MD 09/19/2019  5:28 PM

## 2019-09-19 NOTE — Evaluation (Signed)
Physical Therapy Evaluation Patient Details Name: Brian Chapman MRN: [id number] DOB: 09-Apr-1946 Today's Date: 09/19/2019   History of Present Illness  Patient is a 74 y/o male who presents with chest pain, found to have STEMI, now s/p CABG x4 on 09/17/19. PMH includes HTN, HLD, DM, OSA on CPAP.    Clinical Impression  Brian Chapman is a 74 y.o. male POD 2 s/p CABG x4. Patient reports independence with mobility at baseline. Patient is now limited by functional impairments (see PT problem list below) and required min assist/guard for transfers and gait with bil platform walker today. Patient was able to ambulate ~150 feet with Carley Hammed walker and required 2 standing rest breaks. Patient instructed on sternal precautions and provided handout to review. Patient will benefit from continued skilled PT interventions to address impairments and progress towards PLOF. Acute PT will follow to progress mobility and stair training in preparation for safe discharge home.     Follow Up Recommendations Outpatient PT(cardiac rehab)    Equipment Recommendations  Rolling walker with 5" wheels    Recommendations for Other Services       Precautions / Restrictions Precautions Precautions: Fall;Sternal Precaution Booklet Issued: Yes (comment) Restrictions Weight Bearing Restrictions: Yes(sternal precautions)      Mobility  Bed Mobility               General bed mobility comments: pt OOB in recliner at start of session  Transfers Overall transfer level: Needs assistance Equipment used: None;Bilateral platform walker Transfers: Sit to/from Stand Sit to Stand: Min assist;Min guard         General transfer comment: cues for sternal precautions throughout transfer, pt crossing arms on chest and min assist required to steady throughout power up and rise to [employer] walker  Ambulation/Gait Ambulation/Gait assistance: Min guard Gait Distance (Feet): 150 Feet Assistive device: Bilateral platform  walker Gait Pattern/deviations: Step-through pattern;Decreased stride length;Wide base of support Gait velocity: decreased   General Gait Details: pt steady with gait using Eva walker, pt required 2 standing rest breaks throughout and cues for pursedl ip breathing, pt on RA initially and SpO2 dropped to 90%, bumped pt up to 0.5 L/min  and pt improve to 94%. , HR remained between 70-90 bpm with gait.  Stairs            Wheelchair Mobility    Modified Rankin (Stroke Patients Only)       Balance Overall balance assessment: Needs assistance   Sitting balance-Leahy Scale: Good     Standing balance support: Bilateral upper extremity supported Standing balance-Leahy Scale: Fair            Pertinent Vitals/Pain Pain Assessment: No/denies pain    Home Living Family/patient expects to be discharged to:: Private residence Living Arrangements: Spouse/significant other Available Help at Discharge: Family;Available PRN/intermittently(pt's wife still working) Type of Home: House Home Access: Stairs to enter Entrance Stairs-Rails: None Secretary/administrator of Steps: a few Home Layout: One level Home Equipment: None      Prior Function Level of Independence: Independent         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     Hand Dominance        Extremity/Trunk Assessment   Upper Extremity Assessment Upper Extremity Assessment: Overall WFL for tasks assessed    Lower Extremity Assessment Lower Extremity Assessment: Overall WFL for tasks assessed    Cervical / Trunk Assessment Cervical / Trunk Assessment: Normal  Communication   Communication: [facility]  Cognition Arousal/Alertness: Awake/alert Behavior During Therapy: WFL for tasks assessed/performed Overall Cognitive Status: Within Functional Limits for tasks assessed         General Comments      Exercises     Assessment/Plan    PT Assessment Patient needs continued PT services  PT  Problem List Decreased strength;Decreased mobility;Decreased knowledge of use of DME;Decreased balance;Decreased activity tolerance;Decreased knowledge of precautions;Cardiopulmonary status limiting activity       PT Treatment Interventions DME instruction;Functional mobility training    PT Goals (Current goals can be found in the Care Plan section)  Acute Rehab PT Goals Patient Stated Goal: get back to home, independence, and back to work PT Goal Formulation: With patient Time For Goal Achievement: 10/03/19 Potential to Achieve Goals: Good    Frequency Min 3X/week    AM-PAC PT "6 Clicks" Mobility  Outcome Measure Help needed turning from your back to your side while in a flat bed without using bedrails?: A Little Help needed moving from lying on your back to sitting on the side of a flat bed without using bedrails?: A Little Help needed moving to and from a bed to a chair (including a wheelchair)?: A Little Help needed standing up from a chair using your arms (e.g., wheelchair or bedside chair)?: A Little Help needed to walk in hospital room?: A Little Help needed climbing 3-5 steps with a railing? : A Lot 6 Click Score: 17    End of Session Equipment Utilized During Treatment: Gait belt Activity Tolerance: Patient tolerated treatment well Patient left: in chair;with family/visitor present;with call bell/phone within reach Nurse Communication: Mobility status PT Visit Diagnosis: Muscle weakness (generalized) (M62.81);Difficulty in walking, not elsewhere classified (R26.2)    Time: 1020-1045 PT Time Calculation (min) (ACUTE ONLY): 25 min   Charges:   PT Evaluation $PT Eval Moderate Complexity: 1 Mod PT Treatments $Gait Training: 8-22 mins       Verner Mould, DPT Physical Therapist with [facility] [phone]  09/19/2019 11:54 AM

## 2019-09-19 NOTE — Progress Notes (Signed)
Chaplain engaged in initial visit with Brian Chapman and his wife.  Brian Chapman requested prayer.  Chaplain prayed over them and offered support.  Chaplain will follow-up as needed.

## 2019-09-20 LAB — GLUCOSE, CAPILLARY
Glucose-Capillary: 104 mg/dL — ABNORMAL HIGH (ref 70–99)
Glucose-Capillary: 108 mg/dL — ABNORMAL HIGH (ref 70–99)
Glucose-Capillary: 117 mg/dL — ABNORMAL HIGH (ref 70–99)
Glucose-Capillary: 141 mg/dL — ABNORMAL HIGH (ref 70–99)
Glucose-Capillary: 158 mg/dL — ABNORMAL HIGH (ref 70–99)
Glucose-Capillary: 166 mg/dL — ABNORMAL HIGH (ref 70–99)

## 2019-09-20 LAB — BASIC METABOLIC PANEL
Anion gap: 9 (ref 5–15)
BUN: 20 mg/dL (ref 8–23)
CO2: 23 mmol/L (ref 22–32)
Calcium: 7.7 mg/dL — ABNORMAL LOW (ref 8.9–10.3)
Chloride: 103 mmol/L (ref 98–111)
Creatinine, Ser: 1.06 mg/dL (ref 0.61–1.24)
GFR calc Af Amer: >60 mL/min (ref >60)
GFR calc non Af Amer: >60 mL/min (ref >60)
Glucose, Bld: 138 mg/dL — ABNORMAL HIGH (ref 70–99)
Potassium: 3.5 mmol/L (ref 3.5–5.1)
Sodium: 135 mmol/L (ref 135–145)

## 2019-09-20 LAB — CBC
HCT: 29.9 % — ABNORMAL LOW (ref 39.0–52.0)
Hemoglobin: 9.7 g/dL — ABNORMAL LOW (ref 13.0–17.0)
MCH: 29.1 pg (ref 26.0–34.0)
MCHC: 32.4 g/dL (ref 30.0–36.0)
MCV: 89.8 fL (ref 80.0–100.0)
Platelets: 115 10*3/uL — ABNORMAL LOW (ref 150–400)
RBC: 3.33 MIL/uL — ABNORMAL LOW (ref 4.22–5.81)
RDW: 14.2 % (ref 11.5–15.5)
WBC: 11.4 10*3/uL — ABNORMAL HIGH (ref 4.0–10.5)
nRBC: 0 % (ref 0.0–0.2)

## 2019-09-20 MED ORDER — METOPROLOL TARTRATE 12.5 MG HALF TABLET
12.5000 mg | ORAL_TABLET | Freq: Three times a day (TID) | ORAL | Status: DC
  Administered 2019-09-20 – 2019-09-21 (×3): 12.5 mg via ORAL
  Filled 2019-09-20 (×3): qty 1

## 2019-09-20 MED ORDER — LIVING WELL WITH DIABETES BOOK
Freq: Once | Status: AC
  Filled 2019-09-20 (×2): qty 1

## 2019-09-20 MED ORDER — INSULIN DETEMIR 100 UNIT/ML ~~LOC~~ SOLN
7.0000 [IU] | Freq: Two times a day (BID) | SUBCUTANEOUS | Status: DC
  Administered 2019-09-20 – 2019-09-21 (×3): 7 [IU] via SUBCUTANEOUS
  Filled 2019-09-20 (×5): qty 0.07

## 2019-09-20 MED ORDER — AMIODARONE HCL 200 MG PO TABS
400.0000 mg | ORAL_TABLET | Freq: Every day | ORAL | Status: DC
  Administered 2019-09-20 – 2019-09-21 (×2): 400 mg via ORAL
  Filled 2019-09-20 (×2): qty 2

## 2019-09-20 MED ORDER — FUROSEMIDE 10 MG/ML IJ SOLN
40.0000 mg | Freq: Two times a day (BID) | INTRAMUSCULAR | Status: DC
  Administered 2019-09-20 – 2019-09-21 (×3): 40 mg via INTRAVENOUS
  Filled 2019-09-20 (×3): qty 4

## 2019-09-20 MED ORDER — POTASSIUM CHLORIDE CRYS ER 20 MEQ PO TBCR
20.0000 meq | EXTENDED_RELEASE_TABLET | Freq: Two times a day (BID) | ORAL | Status: DC
  Administered 2019-09-20 – 2019-09-21 (×3): 20 meq via ORAL
  Filled 2019-09-20 (×3): qty 1

## 2019-09-20 MED ORDER — METOPROLOL TARTRATE 25 MG/10 ML ORAL SUSPENSION
12.5000 mg | Freq: Three times a day (TID) | ORAL | Status: DC
  Filled 2019-09-20 (×4): qty 5

## 2019-09-20 NOTE — Progress Notes (Signed)
Physical Therapy Treatment Patient Details Name: Brian Chapman MRN: [id number] DOB: Dec 09, 1945 Today's Date: 09/20/2019    History of Present Illness Patient is a 74 y/o male who presents with chest pain, found to have STEMI, now s/p CABG x4 on 09/17/19. PMH includes HTN, HLD, DM, OSA on CPAP.    PT Comments    Pt tolerated treatment well demonstrating improved activity tolerance. Pt continues to require physical assistance for bed mobility at this time to maintain sternal precautions, but is now able to transfer without physical assistance. Pt will continue to benefit from acute PT POC to improve activity tolerance and restore independence in mobility.   Follow Up Recommendations  Outpatient PT(OP cardiac rehab)     Equipment Recommendations  Rolling walker with 5" wheels(pt may have access to one from his store)    Recommendations for Other Services       Precautions / Restrictions Precautions Precautions: Fall;Sternal Precaution Comments: Reviewed sternal precautions Restrictions Weight Bearing Restrictions: No Other Position/Activity Restrictions: sternal precautions    Mobility  Bed Mobility Overal bed mobility: Needs Assistance Bed Mobility: Rolling;Sidelying to Sit;Sit to Sidelying Rolling: Min guard Sidelying to sit: Mod assist     Sit to sidelying: Min assist    Transfers Overall transfer level: Needs assistance Equipment used: Rolling walker (2 wheeled) Transfers: Sit to/from Stand Sit to Stand: Supervision            Ambulation/Gait Ambulation/Gait assistance: Supervision Gait Distance (Feet): 200 Feet Assistive device: Rolling walker (2 wheeled) Gait Pattern/deviations: Step-through pattern Gait velocity: functional Gait velocity interpretation: 1.31 - 2.62 ft/sec, indicative of limited community ambulator General Gait Details: steady step through gait, no significant balance deviations noted   Stairs             Wheelchair  Mobility    Modified Rankin (Stroke Patients Only)       Balance Overall balance assessment: Needs assistance Sitting-balance support: No upper extremity supported;Feet supported Sitting balance-Leahy Scale: Good Sitting balance - Comments: supervision   Standing balance support: Bilateral upper extremity supported Standing balance-Leahy Scale: Good Standing balance comment: supervision with BUE support of RW                            Cognition Arousal/Alertness: Awake/alert Behavior During Therapy: WFL for tasks assessed/performed Overall Cognitive Status: Within Functional Limits for tasks assessed                                        Exercises      General Comments General comments (skin integrity, edema, etc.): VSS on RA      Pertinent Vitals/Pain Pain Assessment: No/denies pain    Home Living                      Prior Function            PT Goals (current goals can now be found in the care plan section) Acute Rehab PT Goals Patient Stated Goal: get back to home, independence, and back to work Progress towards PT goals: Progressing toward goals    Frequency    Min 3X/week      PT Plan Current plan remains appropriate    Co-evaluation              AM-PAC PT "6 Clicks" Mobility  Outcome Measure  Help needed turning from your back to your side while in a flat bed without using bedrails?: A Little Help needed moving from lying on your back to sitting on the side of a flat bed without using bedrails?: A Lot Help needed moving to and from a bed to a chair (including a wheelchair)?: None Help needed standing up from a chair using your arms (e.g., wheelchair or bedside chair)?: None Help needed to walk in hospital room?: None Help needed climbing 3-5 steps with a railing? : A Little 6 Click Score: 20    End of Session Equipment Utilized During Treatment: (none) Activity Tolerance: Patient tolerated  treatment well Patient left: in bed;with call bell/phone within reach Nurse Communication: Mobility status PT Visit Diagnosis: Muscle weakness (generalized) (M62.81);Difficulty in walking, not elsewhere classified (R26.2)     Time: 7989-2119 PT Time Calculation (min) (ACUTE ONLY): 19 min  Charges:  $Gait Training: 8-22 mins                     Arlyss Gandy, PT, DPT Acute Rehabilitation Pager: [phone]    Arlyss Gandy 09/20/2019, 4:08 PM

## 2019-09-20 NOTE — Discharge Instructions (Signed)

## 2019-09-20 NOTE — Progress Notes (Addendum)
[facility] DAILY ICU PROGRESS NOTE                   [id number]            ,[place] [id number]          [phone]   3 Days Post-Op Procedure(s) (LRB): CORONARY ARTERY BYPASS GRAFTING (CABG), ON PUMP, TIMES FOUR, USING LEFT INTERNAL MAMMARY ARTERY AND RIGHT GREATER SAPHENOUS VEIN HARVESTED ENDOSCOPICALLY (N/A) TRANSESOPHAGEAL ECHOCARDIOGRAM (TEE) (N/A)  Total Length of Stay:  LOS: 4 days   Subjective: Up in the bedside chair.  Feels like he is making progress, no new complaints.  Developed afib this am, now back in SR after amiodarone infusion started.  BM x 2 yesterday.  Objective: Vital signs in last 24 hours: Temp:  [97.8 F (36.6 C)-98.9 F (37.2 C)] 98.3 F (36.8 C) (02/10 0814) Pulse Rate:  [63-95] 74 (02/10 0645) Cardiac Rhythm: Atrial fibrillation (02/10 0730) Resp:  [17-30] 26 (02/10 0645) BP: (87-149)/(52-103) 127/72 (02/10 0645) SpO2:  [85 %-100 %] 98 % (02/10 0645) Weight:  [103.1 kg] 103.1 kg (02/10 0500)  Filed Weights   09/19/19 0600 09/19/19 0700 09/20/19 0500  Weight: 104.5 kg 104.5 kg 103.1 kg    Weight change: -1.397 kg   Hemodynamic parameters for last 24 hours:    Intake/Output from previous day: 02/09 0701 - 02/10 0700 In: 914.9 [I.V.:814.9; IV Piggyback:100] Out: 1440 [Urine:1070; Stool:300; Chest Tube:70]  Intake/Output this shift: Total I/O In: -  Out: 350 [Urine:350]  Current Meds: Scheduled Meds: . acetaminophen  1,000 mg Oral Q6H   Or  . acetaminophen (TYLENOL) oral liquid 160 mg/5 mL  1,000 mg Per Tube Q6H  . aspirin  81 mg Oral Daily  . atorvastatin  80 mg Oral Daily  . bisacodyl  10 mg Oral Daily   Or  . bisacodyl  10 mg Rectal Daily  . calcium carbonate  1 tablet Oral Daily  . Chlorhexidine Gluconate Cloth  6 each Topical Daily  . clopidogrel  75 mg Oral Daily  . colchicine  0.6 mg Oral Daily  . docusate sodium  200 mg Oral Daily  . insulin aspart  0-24 Units Subcutaneous Q4H  . insulin detemir  7 Units  Subcutaneous Daily  . mouth rinse  15 mL Mouth Rinse BID  . metoprolol tartrate  12.5 mg Oral BID   Or  . metoprolol tartrate  12.5 mg Per Tube BID  . pantoprazole  40 mg Oral Daily  . pantoprazole (PROTONIX) IV  40 mg Intravenous Q12H  . sodium chloride flush  10-40 mL Intracatheter Q12H  . sodium chloride flush  3 mL Intravenous Q12H   Continuous Infusions: . sodium chloride    . sodium chloride    . sodium chloride 20 mL/hr at 09/19/19 0300  . amiodarone 30 mg/hr (09/20/19 0700)  . lactated ringers    . lactated ringers Stopped (09/17/19 1915)  . lactated ringers 20 mL/hr at 09/20/19 0700   PRN Meds:.sodium chloride, dextrose, lactated ringers, metoprolol tartrate, morphine injection, ondansetron (ZOFRAN) IV, oxyCODONE, sodium chloride flush, sodium chloride flush, traMADol  General appearance: alert, cooperative and no distress Neurologic: intact Heart: Afib earlier, now SR in 70's.  Lungs: Breath sounds clear anterior. CT drainage 69ml  for past 24 hours.  Abdomen: Soft, NT, few BS.   Extremities: Well perfused with palpable distal pulses. Right groin dressing dry (at IABP site), Right EVH incision is intact and dry.  Wound:Ther sternal incision  is covered with a dry dressing.   Lab Results: CBC: Recent Labs    09/19/19 0424 09/20/19 0500  WBC 14.2* 11.4*  HGB 10.0* 9.7*  HCT 31.5* 29.9*  PLT 104* 115*   BMET:  Recent Labs    09/19/19 0424 09/20/19 0500  NA 137 135  K 3.8 3.5  CL 103 103  CO2 24 23  GLUCOSE 133* 138*  BUN 19 20  CREATININE 1.13 1.06  CALCIUM 7.8* 7.7*    CMET: Lab Results  Component Value Date   WBC 11.4 (H) 09/20/2019   HGB 9.7 (L) 09/20/2019   HCT 29.9 (L) 09/20/2019   PLT 115 (L) 09/20/2019   GLUCOSE 138 (H) 09/20/2019   NA 135 09/20/2019   K 3.5 09/20/2019   CL 103 09/20/2019   CREATININE 1.06 09/20/2019   BUN 20 09/20/2019   CO2 23 09/20/2019   INR 1.3 (H) 09/17/2019      PT/INR:  Recent Labs    09/17/19 1923   LABPROT 16.5*  INR 1.3*   Radiology: No results found.   Assessment/Plan: S/P Procedure(s) (LRB): CORONARY ARTERY BYPASS GRAFTING (CABG), ON PUMP, TIMES FOUR, USING LEFT INTERNAL MAMMARY ARTERY AND RIGHT GREATER SAPHENOUS VEIN HARVESTED ENDOSCOPICALLY (N/A) TRANSESOPHAGEAL ECHOCARDIOGRAM (TEE) (N/A)  -POD3 CABG x 4 for acute STEMI, EF 45-50% pre-op with LV hypertrophy. Hemodynamically stable. Continue ASA, statin, low-dose metoprolol. Can d/c pacer and remove chest tubes today. Advance activity.  -New onset atriall fibrillation- back in SR on IV amiodarone just started this AM. Continue infusion for now, convert to PO tomorrow.   -Type 2 DM- Glucose 95-160's past 24 hours on Levemir and SSI.  Check A1C per DM coordinator request.   -Expected acute blood loss anemia-Hct stable, transfusion not indicated. Monitor.   -Pulm- reasonable O2 sats on 2Lnc. Does not appear significantly volume overloaded. Continue working on pulmonary hygiene.     Leary Roca, PA-C [phone] 09/20/2019 8:44 AM Agree with note; transfer to progressive

## 2019-09-20 NOTE — Discharge Summary (Signed)
Physician Discharge Summary  Patient ID: Brian Chapman MRN: [id number] DOB/AGE: [birth year] 74 y.o.  Admit date: 09/16/2019 Discharge date: 09/21/2019  Admission Diagnoses: Acute  ST elevation myocardial infarction Multi-vessel coronary artery disease Left main coronary artery stenosis Type 2 diabetes mellitus Dyslipidemia   Discharge Diagnoses:   Acute  ST elevation myocardial infarction Multi-vessel coronary artery disease S/P CABG x 4 Left main coronary artery stenosis Type 2 diabetes mellitus Dyslipidemia Post-operative atrial fibrillation  Discharged Condition: stable  History of Present Illness:     74 yo man in [place] until this am when he was driving to work and began to experience severe mid-sternal cP. This persisted despite NTG SL. He arrived at work and the pain persisted and was associated with weakness, nausea, and SOB. He then drove himself to ED at [facility]. There, he had evidence for STEMI. Taken to cath lab where severe LM CAD diagnosed. IABP placed and txferred to [facility]. Hemodynamically stable. Now CP free without c/o.   Hospital Course:   Mr. Holzschuh remained stable following transfer to [facility].  Preoperative work-up was initiated.  Urgent coronary bypass grafting was offered to the patient and he decided to proceed with surgery.  He was taken to the operating room on 09/17/2019 where CABG x4 was carried out without complication.  Following the procedure, he separated from cardiopulmonary bypass without difficulty and was transferred to the CVICU in stable condition.  Vital signs, hemodynamics, and cardiac rhythm remained stable.  He was weaned from the ventilator and extubated without difficulty.  On the third postoperative day, he developed atrial fibrillation.  He was treated with amiodarone infusion following standard bolus.  This resulted in conversion back to sinus rhythm.  His pacemaker and chest tubes were removed on postop day 3.  He was transferred  to progressive care.  Physical therapy was initiated to assist with mobility and he made good progress.  He eventually regained complete independence with his mobility and was tolerating solid diet without difficulty.  He was having appropriate bowel bladder function. He was seen by the diabetes coordinator who recommended providing a glucometer at discharge and this was done. Hgb a1C during this admission was 6.6.  He was felt ready for discharge on post-op day 4.   Consults: cardiology  Significant Diagnostic Studies:  LEFT HEART CATH AND CORONARY ANGIOGRAPHY  Conclusion    Prox RCA lesion is 40% stenosed.  Dist LAD lesion is 100% stenosed.  Post intervention, there is a 10% residual stenosis.  Balloon angioplasty was performed using a BALLOON MINITREK RX 2.0X15.  Mid LAD lesion is 70% stenosed.  Prox LAD lesion is 85% stenosed.  Lat 1st Diag lesion is 80% stenosed.  Dist LM to Ost LAD lesion is 90% stenosed.  Prox Cx to Mid Cx lesion is 30% stenosed.  The left ventricular systolic function is normal.  LV end diastolic pressure is moderately elevated.   1.  Severe distal left main stenosis with plaque rupture extending into the ostial LAD with occluded distal LAD likely due to embolization.  Mild to moderate proximal RCA stenosis.  Surgical targets include mid LAD, first diagonal and OM 3. 2.  Normal LV systolic function and moderately elevated left ventricular end-diastolic pressure. 3.  Successful balloon angioplasty to the distal LAD to establish TIMI-3 flow.  Cardiac catheterization was performed via the right radial artery. 4.  Successful intra-aortic balloon pump placement via the right common femoral artery.  Recommendations: Given this the left main involvement,  diffuse LAD disease and diabetic status, I think surgical revascularization with urgent/emergent CABG is indicated. The patient did not receive any oral antiplatelet medication other than  aspirin. Continue unfractionated heparin. I discussed the case with Dr. [name].    ECHOCARDIOGRAM COMPLETE Order #: [id number] Accession #: [id number] Patient Info  Patient name: Brian Chapman  MRN: [id number]  Age: 74 y.o.  Sex: male  MyChart Results Release  MyChart Status: Active Results Release  Vitals  BP Height Weight BSA (Calculated - sq m)  127/72 5\' 6"  (1.676 m) 103.1 kg 2.19 sq meters  Order-Level Documents:  Scan on 09/16/2019 4:20 PM by Default, Provider, MD     Study Result  ECHOCARDIOGRAM REPORT       Patient Name:  11/14/2019 Chapman Date of Exam: 09/16/2019  Medical Rec #: 11/14/2019      Height:    66.0 in  Accession #:  [id number]     Weight:    205.5 lb  Date of Birth: 23-Oct-1945      BSA:     2.02 m  Patient Age:  73 years      BP:      115/95 mmHg  Patient Gender: M          HR:      56 bpm.  Exam Location: Inpatient   Procedure: 2D Echo and Intracardiac Opacification Agent   Indications:  STEMI I21.3    History:    Patient has no prior history of Echocardiogram  examinations.         CAD; Risk Factors:Hypertension, Diabetes and Dyslipidemia.    Sonographer:  12/05/1945 RDCS (AE)  Referring Phys: Ross [facility] ATKINS     Sonographer Comments: Patient is morbidly obese. Image acquisition  challenging due to patient body habitus.  IMPRESSIONS    1. Left ventricular ejection fraction, by visual estimation, is 45 to  50%. The left ventricle has hyperdynamic function. There is severely  increased left ventricular hypertrophy.  2. Definity contrast agent was given IV to delineate the left ventricular  endocardial borders.  3. The left ventricle demonstrates regional wall motion abnormalities.  4. Severe akinesis of the left ventricular, apical apical segment and  inferoapical and distal anteroseptal/anteroapical walls. No apical  thrombus.  5.  Left ventricular diastolic parameters are consistent with Grade I  diastolic dysfunction (impaired relaxation).  6. Global right ventricle has mildly reduced systolic function.The right  ventricular size is normal. No increase in right ventricular wall  thickness.  7. Hypokinetic right ventricular apex.  8. Left atrial size was normal.  9. Right atrial size was normal.  10. Mild mitral annular calcification.  11. The mitral valve is grossly normal. Trivial mitral valve  regurgitation.  12. The tricuspid valve is grossly normal.  13. The tricuspid valve is grossly normal. Tricuspid valve regurgitation  is trivial.  14. The aortic valve is tricuspid. Aortic valve regurgitation is not  visualized. Mild aortic valve sclerosis without stenosis.  15. The pulmonic valve was grossly normal. Pulmonic valve regurgitation is  not visualized.  16. The interatrial septum was not well visualized.   FINDINGS  Left Ventricle: Left ventricular ejection fraction, by visual estimation,  is 45 to 50%. The left ventricle has hyperdynamic function. Severe  akinesis of the left ventricular, apical apical segment and inferoapical  and distal anteroseptal/anteroapical  walls. Definity contrast agent was given IV to delineate the left  ventricular endocardial borders. The left ventricle demonstrates regional  wall  motion abnormalities. There is severely increased left ventricular  hypertrophy. Left ventricular diastolic  parameters are consistent with Grade I diastolic dysfunction (impaired  relaxation). Indeterminate filling pressures.   Right Ventricle: The right ventricular size is normal. No increase in  right ventricular wall thickness. Global RV systolic function is has  mildly reduced systolic function. The motion of the right ventricle apex  hypokinetic.   Left Atrium: Left atrial size was normal in size.   Right Atrium: Right atrial size was normal in size   Pericardium: There is no  evidence of pericardial effusion.   Mitral Valve: The mitral valve is grossly normal. Mild mitral annular  calcification. Trivial mitral valve regurgitation. MV peak gradient, 3.9  mmHg.   Tricuspid Valve: The tricuspid valve is grossly normal. Tricuspid valve  regurgitation is trivial.   Aortic Valve: The aortic valve is tricuspid. Aortic valve regurgitation is  not visualized. Mild aortic valve sclerosis is present, with no evidence  of aortic valve stenosis. Aortic valve mean gradient measures 6.0 mmHg.  Aortic valve peak gradient  measures 11.3 mmHg. Aortic valve area, by VTI measures 2.87 cm.   Pulmonic Valve: The pulmonic valve was grossly normal. Pulmonic valve  regurgitation is not visualized. Pulmonic regurgitation is not visualized.   Aorta: The aortic root and ascending aorta are structurally normal, with  no evidence of dilitation.   Venous: The inferior vena cava was not well visualized.   IAS/Shunts: The interatrial septum was not well visualized.     LEFT VENTRICLE  PLAX 2D  LVIDd:     3.70 cm Diastology  LVIDs:     2.20 cm LV e' lateral:  7.07 cm/s  LV PW:     1.60 cm LV E/e' lateral: 8.4  LV IVS:    1.80 cm LV e' medial:  5.33 cm/s  LVOT diam:   2.00 cm LV E/e' medial: 11.2  LV SV:     42 ml  LV SV Index:  19.80  LVOT Area:   3.14 cm     RIGHT VENTRICLE  RV Basal diam: 3.00 cm  RV S prime:   16.80 cm/s  TAPSE (M-mode): 2.8 cm   LEFT ATRIUM       Index    RIGHT ATRIUM      Index  LA diam:    2.30 cm 1.14 cm/m RA Area:   13.40 cm  LA Vol (A2C):  53.7 ml 26.55 ml/m RA Volume:  28.30 ml 13.99 ml/m  LA Vol (A4C):  64.5 ml 31.89 ml/m  LA Biplane Vol: 63.9 ml 31.59 ml/m  AORTIC VALVE  AV Area (Vmax):  2.60 cm  AV Area (Vmean):  2.65 cm  AV Area (VTI):   2.87 cm  AV Vmax:      168.00 cm/s  AV Vmean:     113.000 cm/s  AV VTI:      0.341 m  AV Peak Grad:    11.3 mmHg  AV Mean Grad:   6.0 mmHg  LVOT Vmax:     139.00 cm/s  LVOT Vmean:    95.300 cm/s  LVOT VTI:     0.311 m  LVOT/AV VTI ratio: 0.91    AORTA  Ao Root diam: 3.30 cm  Ao Asc diam: 3.50 cm   MITRAL VALVE  MV Area (PHT): 3.03 cm       SHUNTS  MV Peak grad: 3.9 mmHg       Systemic VTI: 0.31 m  MV Mean grad: 1.0 mmHg  Systemic Diam: 2.00 cm  MV Vmax:    0.99 m/s  MV Vmean:   52.5 cm/s  MV VTI:    0.34 m  MV PHT:    72.50 msec  MV Decel Time: 250 msec  MV E velocity: 59.70 cm/s 103 cm/s  MV A velocity: 81.00 cm/s 70.3 cm/s  MV E/A ratio: 0.74    1.5     Zoila Shutter MD  Electronically signed by Zoila Shutter MD  Signature Date/Time: 09/16/2019/4:19:40 PM        Treatments:   CARDIOTHORACIC SURGERY OPERATIVE NOTE  Date of Procedure:    09/17/2019  Preoperative Diagnosis:      Left Main Coronary Artery Disease s/p STEMI on 09/16/19  Postoperative Diagnosis:    Same  Procedure:        Coronary Artery Bypass Grafting x 4             Left Internal Mammary Artery to Distal Left Anterior Descending Coronary Artery; Saphenous Vein Graft to right Posterolateral Coronary Artery and 1st Obtuse Marginal Branch of Left Circumflex Coronary Artery as a sequenced graft; Sapheonous Vein Graft to 1st  Diagonal Branch Coronary Artery; Endoscopic Vein Harvest from rightThigh and Lower Leg;  Rigid sternal reconstruction with linear plating system  Surgeon:        B. Lorayne Marek, MD  Assistant:       Gaynelle Arabian PA-C  Anesthesia:    get  Operative Findings: ? preserved left ventricular systolic function ? good quality left internal mammary artery conduit ? Good quality saphenous vein conduit ? fair quality target vessels for grafting due to diffuse disease consistent with underlying diabetes    BRIEF CLINICAL NOTE AND INDICATIONS FOR SURGERY  74 yo man presented with sudden onset CP on 2/6. He  underwent LHC showing severe LM CAD and occluded LAD distally, which was opened. He now presents for CABG.    Discharge Exam: Blood pressure 115/66, pulse 75, temperature 98.7 F (37.1 C), temperature source Oral, resp. rate 18, height 5\' 6"  (1.676 m), weight 99.9 kg, SpO2 96 %.  Physical Exam: General appearance:alert, cooperative and no distress Neurologic:intact Heart:Remains in SR. No further a-fib.  Lungs:Breath sounds clear anterior and posterior. Abdomen:Soft, NT, few BS. Extremities:Well perfused with palpable distal pulses. Right groin dressing remains dry (at IABP site), Right EVH incision is intact and dry. Wound:Ther sternal incision is open to air. Skin well approximated with staples.   Disposition:    Allergies as of 09/21/2019      Reactions   Lisinopril Cough      Medication List    STOP taking these medications   valsartan 320 MG tablet Commonly known as: DIOVAN     TAKE these medications   allopurinol 300 MG tablet Commonly known as: ZYLOPRIM Take 300 mg by mouth daily.   amiodarone 200 MG tablet Commonly known as: PACERONE Take 1 tablet (200 mg total) by mouth daily. Start taking on: September 22, 2019   aspirin EC 81 MG tablet Take 81 mg by mouth daily.   atorvastatin 80 MG tablet Commonly known as: LIPITOR Take 80 mg by mouth daily.   benzonatate 100 MG capsule Commonly known as: Tessalon Perles Take 2 capsules (200 mg total) by mouth 3 (three) times daily as needed.   clopidogrel 75 MG tablet Commonly known as: PLAVIX Take 1 tablet (75 mg total) by mouth daily. Start taking on: September 22, 2019   colchicine 0.6 MG tablet Take 1 tablet (0.6 mg  total) by mouth daily. Start taking on: September 22, 2019   cyclobenzaprine 5 MG tablet Commonly known as: FLEXERIL Take 1 tablet (5 mg total) by mouth 3 (three) times daily as needed for muscle spasms.   desonide 0.05 % cream Commonly known as: DESOWEN Apply 1 application  topically 2 (two) times daily.   diclofenac Sodium 1 % Gel Commonly known as: VOLTAREN Apply 4 g topically 4 (four) times daily.   indomethacin 50 MG capsule Commonly known as: INDOCIN Take 50 mg by mouth daily as needed. Gout pain   ketoconazole 2 % shampoo Commonly known as: NIZORAL Apply 1 application topically 2 (two) times a week.   metFORMIN 500 MG tablet Commonly known as: GLUCOPHAGE Take 500 mg by mouth daily with breakfast.   metoprolol tartrate 25 MG tablet Commonly known as: LOPRESSOR Take 12.5 mg by mouth 2 (two) times daily.   traMADol 50 MG tablet Commonly known as: ULTRAM Take 1 tablet (50 mg total) by mouth every 4 (four) hours as needed for up to 7 days for moderate pain.            Durable Medical Equipment  (From admission, onward)         Start     Ordered   09/21/19 0932  DME Glucometer  Once    Comments: Model [id number]   09/21/19 0933        The patient has been discharged on:   1.Beta Blocker:  Yes C[place].Etienne   ]                              No   [   ]                              If No, reason:  2.Ace Inhibitor/ARB: Yes [   ]                                     No  [x ]                                     If No, reason: Marginal BP, to be resumes as outpatient when appropriate.  3.Statin:   Yes [ x  ]                  No  [   ]                  If No, reason:  4.Ecasa:  Yes  [ x  ]                  No   [   ]                  If No, reason:    Follow-up Information    Linden Dolin, MD. Go on 10/02/2019.   Specialty: Cardiothoracic Surgery Why: You have an appointment with Dr. [name] on Monday, 10/02/19 at 11:30am.  Contact information: [address] [place] [address] [phone]        Ronney Asters, NP Follow up on 10/13/2019.   Specialty: Cardiology Why: Please go to hospital follow-up March 5th  at 4:00 PM Contact information: [address] [place] [address] [phone]           Signed: Antony Odea, PA-C 09/21/2019, 10:16 AM

## 2019-09-20 NOTE — Progress Notes (Signed)
CPAP set up for patient tonight. Patient stated he would place himself on CPAP when he was ready for it. Patient informed to call RT if needed assistance.

## 2019-09-20 NOTE — Progress Notes (Addendum)
Inpatient Diabetes Program Recommendations  AACE/ADA: New Consensus Statement on Inpatient Glycemic Control (2015)  Target Ranges:  Prepandial:   less than 140 mg/dL      Peak postprandial:   less than 180 mg/dL (1-2 hours)      Critically ill patients:  140 - 180 mg/dL   Lab Results  Component Value Date   GLUCAP 158 (H) 09/20/2019    Review of Glycemic Control   Results for Brian Chapman, Brian Chapman (MRN [id number]) as of 09/20/2019 12:37  Ref. Range 09/19/2019 20:02 09/19/2019 23:34 09/20/2019 03:45 09/20/2019 08:10 09/20/2019 12:09  Glucose-Capillary Latest Ref Range: 70 - 99 mg/dL 163 (H) 133 (H) 104 (H) 166 (H) 158 (H)   Diabetes history: DM2-newly diagnosed a few weeks ago Outpatient Diabetes medications: Metformin 500 mg daily Current orders for Inpatient glycemic control: Levemir 7 units BID+ Novolog 0-24 units Q4H  Note:  Referral for new diagnoses of DM2.  In reviewing medications in chart it is noted that patient takes Metformin 500 mg daily.   Spoke with patient at bedside.  Very pleasant.  Patient states he was recently diagnosed with DM2 at his PCP through the [place]. He states the PCP told him he was "just over the limit" and is no longer pre-diabetic but has diabetes.  He started Metformin last week when his prescription was mailed to him. Asked patient if he had been educated on diabetes and he said he was not.    Spoke with pt about new diagnosis.  Explained what an A1C is, basic pathophysiology of DM Type 2, basic home care, basic diabetes diet nutrition principles, importance of checking CBGs and maintaining good CBG control to prevent long-term and short-term complications. Reviewed signs and symptoms of hyperglycemia. Also reviewed blood sugar goals at home. Patients current A1C is pending.  He was not sure what it was at the [place] and it is not available in care everywhere.   RNs to provide ongoing basic DM education at bedside with this patient. Have ordered educational booklet,  insulin starter kit, and DM videos. Have also placed RD consult for DM diet education for this patient.   He does not have a glucometer.  MD, please order one for discharge #[id number].  Encouraged him to check his blood sugar in the morning before eating and in the afternoon.  Explained he can take his meter to his next PCP visit for any needed medication adjustments.    He does drink a lot of regular soda and does not eat well.  We discussed the Plate Method and foods that contain carbohydrates.  Educated him on limiting his CHO to 50-7- grams per meal.  Encouraged light physical activity once cleared by MD.  He does plan to attend cardiac rehab.    Ordered Living Well with diabetes and attached education to his AVS.  Will plan to follow up with patient once current A1C is resulted.  Asked RN to allow patient to check his blood sugars.    Thank you, Reche Dixon, RN, BSN Diabetes Coordinator Inpatient Diabetes Program [phone] (team pager from 8a-5p)

## 2019-09-21 ENCOUNTER — Inpatient Hospital Stay (HOSPITAL_COMMUNITY): Payer: No Typology Code available for payment source

## 2019-09-21 DIAGNOSIS — Z951 Presence of aortocoronary bypass graft: Secondary | ICD-10-CM | POA: Diagnosis not present

## 2019-09-21 DIAGNOSIS — I2102 ST elevation (STEMI) myocardial infarction involving left anterior descending coronary artery: Secondary | ICD-10-CM | POA: Diagnosis not present

## 2019-09-21 DIAGNOSIS — R918 Other nonspecific abnormal finding of lung field: Secondary | ICD-10-CM | POA: Diagnosis not present

## 2019-09-21 LAB — ECHO INTRAOPERATIVE TEE
Height: 66 in
Weight: 3527.36 oz

## 2019-09-21 LAB — CBC
HCT: 32.6 % — ABNORMAL LOW (ref 39.0–52.0)
Hemoglobin: 10.8 g/dL — ABNORMAL LOW (ref 13.0–17.0)
MCH: 29 pg (ref 26.0–34.0)
MCHC: 33.1 g/dL (ref 30.0–36.0)
MCV: 87.6 fL (ref 80.0–100.0)
Platelets: 184 10*3/uL (ref 150–400)
RBC: 3.72 MIL/uL — ABNORMAL LOW (ref 4.22–5.81)
RDW: 14.3 % (ref 11.5–15.5)
WBC: 11.5 10*3/uL — ABNORMAL HIGH (ref 4.0–10.5)
nRBC: 0 % (ref 0.0–0.2)

## 2019-09-21 LAB — GLUCOSE, CAPILLARY
Glucose-Capillary: 97 mg/dL (ref 70–99)
Glucose-Capillary: 119 mg/dL — ABNORMAL HIGH (ref 70–99)

## 2019-09-21 LAB — HEMOGLOBIN A1C
Hgb A1c MFr Bld: 6.5 % — ABNORMAL HIGH (ref 4.8–5.6)
Hgb A1c MFr Bld: 6.6 % — ABNORMAL HIGH (ref 4.8–5.6)
Mean Plasma Glucose: 140 mg/dL
Mean Plasma Glucose: 142.72 mg/dL

## 2019-09-21 LAB — BASIC METABOLIC PANEL
Anion gap: 11 (ref 5–15)
BUN: 22 mg/dL (ref 8–23)
CO2: 26 mmol/L (ref 22–32)
Calcium: 8.2 mg/dL — ABNORMAL LOW (ref 8.9–10.3)
Chloride: 99 mmol/L (ref 98–111)
Creatinine, Ser: 1.26 mg/dL — ABNORMAL HIGH (ref 0.61–1.24)
GFR calc Af Amer: >60 mL/min (ref >60)
GFR calc non Af Amer: 56 mL/min — ABNORMAL LOW (ref >60)
Glucose, Bld: 117 mg/dL — ABNORMAL HIGH (ref 70–99)
Potassium: 3.8 mmol/L (ref 3.5–5.1)
Sodium: 136 mmol/L (ref 135–145)

## 2019-09-21 MED ORDER — COLCHICINE 0.6 MG PO TABS: 0.6000 mg | ORAL_TABLET | Freq: Every day | ORAL | 0 refills | Status: DC

## 2019-09-21 MED ORDER — AMIODARONE HCL 200 MG PO TABS: 200.0000 mg | ORAL_TABLET | Freq: Two times a day (BID) | ORAL | 1 refills | Status: DC

## 2019-09-21 MED ORDER — AMIODARONE IV BOLUS ONLY 150 MG/100ML
150.0000 mg | Freq: Once | INTRAVENOUS | Status: AC
  Administered 2019-09-21: 12:00:00 150 mg via INTRAVENOUS
  Filled 2019-09-21: qty 100

## 2019-09-21 MED ORDER — AMIODARONE HCL 200 MG PO TABS: 400.0000 mg | ORAL_TABLET | Freq: Every day | ORAL | 1 refills | Status: DC

## 2019-09-21 MED ORDER — CLOPIDOGREL BISULFATE 75 MG PO TABS: 75.0000 mg | ORAL_TABLET | Freq: Every day | ORAL | 2 refills | Status: AC

## 2019-09-21 MED ORDER — AMIODARONE HCL 200 MG PO TABS: 200.0000 mg | ORAL_TABLET | Freq: Every day | ORAL | 1 refills | Status: DC

## 2019-09-21 MED ORDER — TRAMADOL HCL 50 MG PO TABS: 50.0000 mg | ORAL_TABLET | ORAL | 0 refills | Status: AC | PRN

## 2019-09-21 NOTE — Progress Notes (Signed)
PT Cancellation Note  Patient Details Name: Brian Chapman MRN: [id number] DOB: Nov 18, 1945   Cancelled Treatment:    Reason Eval/Treat Not Completed: Patient declined, no reason specified. Pt declining PT intervention, reporting he will D/C home today and feels comfortable managing mobility and sternal precautions. PT will continue to follow until D/C is complete.   Arlyss Gandy 09/21/2019, 12:36 PM

## 2019-09-21 NOTE — Progress Notes (Signed)
Inpatient Diabetes Program Recommendations  AACE/ADA: New Consensus Statement on Inpatient Glycemic Control (2015)  Target Ranges:  Prepandial:   less than 140 mg/dL      Peak postprandial:   less than 180 mg/dL (1-2 hours)      Critically ill patients:  140 - 180 mg/dL   Lab Results  Component Value Date   GLUCAP 97 09/21/2019   HGBA1C 6.6 (H) 09/21/2019    Review of Glycemic Control Results for Brian Chapman, WATCHMAN (MRN [id number]) as of 09/21/2019 10:21  Ref. Range 09/20/2019 12:09 09/20/2019 15:51 09/20/2019 21:45 09/20/2019 23:30 09/21/2019 03:28  Glucose-Capillary Latest Ref Range: 70 - 99 mg/dL 423 (H) 702 (H) 301 (H) 108 (H) 97    Inpatient Diabetes Program Recommendations:    Note A1C=6.6% indicating well controlled DM.  Agree with the addition of metformin and glucometer at home.  Should not need insulin.    Thanks,  Beryl Meager, RN, BC-ADM Inpatient Diabetes Coordinator Pager [phone] (8a-5p)

## 2019-09-21 NOTE — Progress Notes (Addendum)
Metoprolol held d/t b/p less than 100 and HR 60. Educated patient. Verbalizes understanding

## 2019-09-21 NOTE — Progress Notes (Signed)
4 Days Post-Op Procedure(s) (LRB): CORONARY ARTERY BYPASS GRAFTING (CABG), ON PUMP, TIMES FOUR, USING LEFT INTERNAL MAMMARY ARTERY AND RIGHT GREATER SAPHENOUS VEIN HARVESTED ENDOSCOPICALLY (N/A) TRANSESOPHAGEAL ECHOCARDIOGRAM (TEE) (N/A) Subjective: Out of bed, walking around room independently. No problems or concerns.  Having appropriate bowel function.  Objective: Vital signs in last 24 hours: Temp:  [97.7 F (36.5 C)-98.7 F (37.1 C)] 98.7 F (37.1 C) (02/11 0752) Pulse Rate:  [63-79] 75 (02/11 0752) Cardiac Rhythm: Normal sinus rhythm (02/11 0705) Resp:  [17-28] 18 (02/11 0752) BP: (98-141)/(58-70) 115/66 (02/11 0752) SpO2:  [90 %-100 %] 96 % (02/10 2036) Weight:  [99.9 kg-100.2 kg] 99.9 kg (02/11 0329)   Intake/Output from previous day: 02/10 0701 - 02/11 0700 In: 1132.4 [P.O.:240; I.V.:892.4] Out: 2600 [Urine:2600] Intake/Output this shift: No intake/output data recorded.  Physical Exam: General appearance: alert, cooperative and no distress Neurologic: intact Heart: Remains in SR. No further a-fib.  Lungs: Breath sounds clear anterior and posterior. Abdomen: Soft, NT, few BS.    Extremities: Well perfused with palpable distal pulses. Right groin dressing remains dry (at IABP site), Right EVH incision is intact and dry.  Wound:Ther sternal incision is open to air. Skin well approximated with staples.   Lab Results: Recent Labs    09/20/19 0500 09/21/19 0332  WBC 11.4* 11.5*  HGB 9.7* 10.8*  HCT 29.9* 32.6*  PLT 115* 184   BMET:  Recent Labs    09/20/19 0500 09/21/19 0332  NA 135 136  K 3.5 3.8  CL 103 99  CO2 23 26  GLUCOSE 138* 117*  BUN 20 22  CREATININE 1.06 1.26*  CALCIUM 7.7* 8.2*    PT/INR: No results for input(s): LABPROT, INR in the last 72 hours. ABG    Component Value Date/Time   PHART 7.302 (L) 09/18/2019 0218   HCO3 21.7 09/18/2019 0218   TCO2 23 09/18/2019 0218   ACIDBASEDEF 4.0 (H) 09/18/2019 0218   O2SAT 92.0 09/18/2019 0218    CBG (last 3)  Recent Labs    09/20/19 2145 09/20/19 2330 09/21/19 0328  GLUCAP 141* 108* 97    Assessment/Plan: S/P Procedure(s) (LRB): CORONARY ARTERY BYPASS GRAFTING (CABG), ON PUMP, TIMES FOUR, USING LEFT INTERNAL MAMMARY ARTERY AND RIGHT GREATER SAPHENOUS VEIN HARVESTED ENDOSCOPICALLY (N/A) TRANSESOPHAGEAL ECHOCARDIOGRAM (TEE) (N/A)    LOS: 5 days   -POD4 CABG x 4 for acute STEMI, EF 45-50% pre-op with LV hypertrophy. Hemodynamically stable. Continue ASA, statin, low-dose metoprolol..Possibly discharge later today.  -New onset atriall fibrillation- remains in SR on po amiodarone   -Type 2 DM- Glucose 95-160's past 24 hours on Levemir and SSI.   A1C per DM coordinator request is 6.6. Plan to discharge with glucometer and on metformin.  -Expected acute blood loss anemia-Hct stable, transfusion not indicated. Monitor.   -Pulm- reasonable O2 sats on RA. Does not appear significantly volume overloaded. Continue working on   Leary Roca, [place] [phone] 09/21/2019

## 2019-09-21 NOTE — TOC Progression Note (Signed)
Transition of Care [facility]) - Progression Note    Patient Details  Name: Brian Chapman MRN: [id number] Date of Birth: [birth year]  [facility] CM/SW Contact  Leone Haven, RN Phone Number: 09/21/2019, 12:34 PM  Clinical Narrative:    Patient is for dc today, [facility], MD has sent meds to [facility], and patient states this is ok with him, he will pick them up from [facility].  Patient states that he goes to [facility]. Last 4 6113.        Expected Discharge Plan and Services           Expected Discharge Date: 09/21/19                                     Social Determinants of Health (SDOH) Interventions    Readmission Risk Interventions No flowsheet data found.

## 2019-09-21 NOTE — Plan of Care (Signed)

## 2019-09-21 NOTE — Progress Notes (Signed)
Progress Note  Patient Name: Brian Chapman Date of Encounter: 09/21/2019  Primary Cardiologist: Kathlyn Sacramento, MD   Subjective   Denies any chest pain or dyspnea   Inpatient Medications    Scheduled Meds: . acetaminophen  1,000 mg Oral Q6H   Or  . acetaminophen (TYLENOL) oral liquid 160 mg/5 mL  1,000 mg Per Tube Q6H  . amiodarone  400 mg Oral Daily  . aspirin  81 mg Oral Daily  . atorvastatin  80 mg Oral Daily  . bisacodyl  10 mg Oral Daily   Or  . bisacodyl  10 mg Rectal Daily  . calcium carbonate  1 tablet Oral Daily  . Chlorhexidine Gluconate Cloth  6 each Topical Daily  . clopidogrel  75 mg Oral Daily  . colchicine  0.6 mg Oral Daily  . docusate sodium  200 mg Oral Daily  . furosemide  40 mg Intravenous BID  . insulin aspart  0-24 Units Subcutaneous Q4H  . insulin detemir  7 Units Subcutaneous BID  . mouth rinse  15 mL Mouth Rinse BID  . metoprolol tartrate  12.5 mg Oral TID   Or  . metoprolol tartrate  12.5 mg Per Tube TID  . pantoprazole  40 mg Oral Daily  . potassium chloride  20 mEq Oral BID  . sodium chloride flush  10-40 mL Intracatheter Q12H   Continuous Infusions:  PRN Meds: dextrose, metoprolol tartrate, ondansetron (ZOFRAN) IV, oxyCODONE, traMADol   Vital Signs    Vitals:   09/20/19 2332 09/20/19 2336 09/21/19 0329 09/21/19 0752  BP: 98/66 98/66 (!) 111/58 115/66  Pulse:  67  75  Resp: (!) 21   18  Temp: 98.4 F (36.9 C)  98.6 F (37 C) 98.7 F (37.1 C)  TempSrc: Oral  Oral Oral  SpO2:    96%  Weight:   99.9 kg   Height:        Intake/Output Summary (Last 24 hours) at 09/21/2019 0926 Last data filed at 09/21/2019 0800 Gross per 24 hour  Intake 699.06 ml  Output 2250 ml  Net -1550.94 ml   Last 3 Weights 09/21/2019 09/20/2019 09/20/2019  Weight (lbs) 220 lb 3.8 oz 220 lb 14.4 oz 227 lb 4.8 oz  Weight (kg) 99.9 kg 100.2 kg 103.103 kg      Telemetry    NSR 60-70s - Personally Reviewed  ECG    No new ECG - Personally  Reviewed  Physical Exam   GEN: No acute distress.   Neck: JVD difficult to assess given body habitus Cardiac: RRR, no murmurs Respiratory: Clear to auscultation bilaterally. GI: Soft, nontender MS: 1+ edema RLE, trace LLE Neuro:  Nonfocal  Psych: Normal affect   Labs    High Sensitivity Troponin:   Recent Labs  Lab 09/16/19 0852  TROPONINIHS 16      Chemistry Recent Labs  Lab 09/19/19 0424 09/20/19 0500 09/21/19 0332  NA 137 135 136  K 3.8 3.5 3.8  CL 103 103 99  CO2 24 23 26   GLUCOSE 133* 138* 117*  BUN 19 20 22   CREATININE 1.13 1.06 1.26*  CALCIUM 7.8* 7.7* 8.2*  GFRNONAA >60 >60 56*  GFRAA >60 >60 >60  ANIONGAP 10 9 11      Hematology Recent Labs  Lab 09/19/19 0424 09/20/19 0500 09/21/19 0332  WBC 14.2* 11.4* 11.5*  RBC 3.42* 3.33* 3.72*  HGB 10.0* 9.7* 10.8*  HCT 31.5* 29.9* 32.6*  MCV 92.1 89.8 87.6  MCH 29.2 29.1  29.0  MCHC 31.7 32.4 33.1  RDW 14.3 14.2 14.3  PLT 104* 115* 184    BNPNo results for input(s): BNP, PROBNP in the last 168 hours.   DDimer No results for input(s): DDIMER in the last 168 hours.   Radiology    No results found.  Cardiac Studies   Cath 09/16/19:  1. Severe distal left main stenosis with plaque rupture extending into the ostial LAD with occluded distal LAD likely due to embolization. Mild to moderate proximal RCA stenosis. Surgical targets include mid LAD, first diagonal and OM 3. 2. Normal LV systolic function and moderately elevated left ventricular end-diastolic pressure. 3. Successful balloon angioplasty to the distal LAD to establish TIMI-3 flow. Cardiac catheterization was performed via the right radial artery. 4. Successful intra-aortic balloon pump placement via the right common femoral artery.  Recommendations: Given this the left main involvement, diffuse LAD disease and diabetic status, I think surgical revascularization with urgent/emergent CABG is indicated. The patient did not receive any  oral antiplatelet medication other than aspirin. Continue unfractionated heparin. I discussed the case with Dr. [name].  TTE 09/16/19: 1. Left ventricular ejection fraction, by visual estimation, is 45 to  50%. The left ventricle has hyperdynamic function. There is severely  increased left ventricular hypertrophy.  2. Definity contrast agent was given IV to delineate the left ventricular  endocardial borders.  3. The left ventricle demonstrates regional wall motion abnormalities.  4. Severe akinesis of the left ventricular, apical apical segment and  inferoapical and distal anteroseptal/anteroapical walls. No apical  thrombus.  5. Left ventricular diastolic parameters are consistent with Grade I  diastolic dysfunction (impaired relaxation).  6. Global right ventricle has mildly reduced systolic function.The right  ventricular size is normal. No increase in right ventricular wall  thickness.  7. Hypokinetic right ventricular apex.  8. Left atrial size was normal.  9. Right atrial size was normal.  10. Mild mitral annular calcification.  11. The mitral valve is grossly normal. Trivial mitral valve  regurgitation.  12. The tricuspid valve is grossly normal.  13. The tricuspid valve is grossly normal. Tricuspid valve regurgitation  is trivial.  14. The aortic valve is tricuspid. Aortic valve regurgitation is not  visualized. Mild aortic valve sclerosis without stenosis.  15. The pulmonic valve was grossly normal. Pulmonic valve regurgitation is  not visualized.  16. The interatrial septum was not well visualized.    Patient Profile     74 y.o. male with CAD, T2DM, HTN, HLD presented with STEMI to [facility] on 09/16/19.  Cath showed severe distal left main stenosis with plaque rupture extending into ostial LAD and occluded distal LAD likely from embolization.  Balloon angioplasty to distal LAD was done and patient was transferred to [facility] for emergent CABG.  Underwent CABG x4 (LIMA-LAD,  SVG-Diag, SVG-OM1->OM2) with Dr [name] on 2/7.  Assessment & Plan    CAD: s/p STEMI, found to have severe distal left main stenosis with plaque rupture extending into ostial LAD and occluded distal LAD likely from embolization. S/p POBA of distal LAD, and then underwent  CABG x4 (LIMA-LAD, SVG-Diag, SVG-OM1->OM2) with Dr [name] on 2/7.  TTE shows EF 45-50%.   -Continue ASA, atorvastatin.  Can consolidate metoprolol to 25mg  XL on discharge  Irregular rhythm: started on amiodarone for AF yesterday.  On review of tele from yesterday morning, I do not see any clear AF, was having an irregular rhythm that looks like sinus with PACs  Disposition: being discharged today, will schedule  outpatient f/u with Dr Kirke Corin   For questions or updates, please contact [facility] Please consult [email or web address] for contact info under        Signed, Little Ishikawa, MD  09/21/2019, 9:26 AM

## 2019-09-21 NOTE — Progress Notes (Signed)
CARDIAC REHAB PHASE I   D/c education completed with pt. Pt educated on importance of site care, and monitoring incision daily. Encouraged continued walks, IS use and sternal precautions. Pt given IS, in-the-tube sheet, along with heart healthy and diabetic diets. Reviewed restrictions, and exercise guidelines. Will refer to CRP II [facility].   [id number] Reynold Bowen, RN BSN 09/21/2019 1:02 PM

## 2019-09-21 NOTE — Progress Notes (Signed)
Occupational Therapy Treatment Patient Details Name: Brian Chapman MRN: [id number] DOB: Jul 07, 1946 Today's Date: 09/21/2019    History of present illness Patient is a 74 y/o male who presents with chest pain, found to have STEMI, now s/p CABG x4 on 09/17/19. PMH includes HTN, HLD, DM, OSA on CPAP.   OT comments  Pt completed toileting, standing grooming and donned socks with set up to supervision. Pt educated in energy conservation and reinforced sternal precautions. Instructed in use of 3 in 1 over toilet to assist in rising without pushing up from his tub. Pt to discharge home today with his supportive wife.  Follow Up Recommendations  No OT follow up;Supervision/Assistance - 24 hour    Equipment Recommendations  Tub/shower seat    Recommendations for Other Services      Precautions / Restrictions Precautions Precautions: Fall;Sternal Precaution Comments: Reviewed sternal precautions Restrictions Weight Bearing Restrictions: No RLE Weight Bearing: Non weight bearing       Mobility Bed Mobility Overal bed mobility: Needs Assistance Bed Mobility: Supine to Sit     Supine to sit: Min assist     General bed mobility comments: HOB up, min assist to raise trunk   Transfers Overall transfer level: Needs assistance Equipment used: None Transfers: Sit to/from Stand Sit to Stand: Supervision         General transfer comment: good technique, supervision for safety/lines    Balance Overall balance assessment: Needs assistance   Sitting balance-Leahy Scale: Good       Standing balance-Leahy Scale: Good                             ADL either performed or assessed with clinical judgement   ADL Overall ADL's : Needs assistance/impaired     Grooming: Supervision/safety;Standing;Wash/dry hands               Lower Body Dressing: Set up;Sitting/lateral leans Lower Body Dressing Details (indicate cue type and reason): for socks Toilet Transfer:  Supervision/safety;Ambulation   Toileting- Clothing Manipulation and Hygiene: Modified independent;Sit to/from stand       Functional mobility during ADLs: Supervision/safety General ADL Comments: Educated in energy conservation strategies and use of 3 in 1.      Vision       Perception     Praxis      Cognition Arousal/Alertness: Awake/alert Behavior During Therapy: WFL for tasks assessed/performed Overall Cognitive Status: Within Functional Limits for tasks assessed                                          Exercises     Shoulder Instructions       General Comments      Pertinent Vitals/ Pain       Pain Assessment: Faces Faces Pain Scale: Hurts a little bit Pain Location: incisions Pain Descriptors / Indicators: Sore;Guarding Pain Intervention(s): Monitored during session;Repositioned  Home Living                                          Prior Functioning/Environment              Frequency  Min 2X/week        Progress Toward Goals  OT Goals(current goals can  now be found in the care plan section)  Progress towards OT goals: Progressing toward goals  Acute Rehab OT Goals Patient Stated Goal: get back to home, independence, and back to work OT Goal Formulation: With patient Time For Goal Achievement: 10/03/19 Potential to Achieve Goals: Good  Plan Discharge plan remains appropriate    Co-evaluation                 AM-PAC OT "6 Clicks" Daily Activity     Outcome Measure   Help from another person eating meals?: None Help from another person taking care of personal grooming?: A Little Help from another person toileting, which includes using toliet, bedpan, or urinal?: A Little Help from another person bathing (including washing, rinsing, drying)?: None Help from another person to put on and taking off regular upper body clothing?: None Help from another person to put on and taking off regular lower  body clothing?: None 6 Click Score: 22    End of Session    OT Visit Diagnosis: Unsteadiness on feet (R26.81);Other abnormalities of gait and mobility (R26.89);Muscle weakness (generalized) (M62.81)   Activity Tolerance Patient tolerated treatment well   Patient Left in bed;with call bell/phone within reach   Nurse Communication          Time: 9211-9417 OT Time Calculation (min): 23 min  Charges: OT General Charges $OT Visit: 1 Visit OT Treatments $Self Care/Home Management : 23-37 mins  Nestor Lewandowsky, OTR/L Acute Rehabilitation Services Pager: [phone] Office: [phone]   Malka So 09/21/2019, 9:55 AM

## 2019-09-21 NOTE — Progress Notes (Addendum)
Pt converted to afib rate control, notified M. Roddenberry PA, will continue to monitor. New orders to give an amiodarone bolus iv.

## 2019-09-21 NOTE — Plan of Care (Signed)
  Problem: Education: Goal: Knowledge of General Education information will improve Description: Including pain rating scale, medication(s)/side effects and non-pharmacologic comfort measures 09/21/2019 1250 by Don Perking, RN Outcome: Completed/Met 09/21/2019 0938 by Don Perking, RN Outcome: Progressing   Problem: Health Behavior/Discharge Planning: Goal: Ability to manage health-related needs will improve 09/21/2019 1250 by Don Perking, RN Outcome: Completed/Met 09/21/2019 0938 by Don Perking, RN Outcome: Progressing   Problem: Clinical Measurements: Goal: Ability to maintain clinical measurements within normal limits will improve 09/21/2019 1250 by Don Perking, RN Outcome: Completed/Met 09/21/2019 0938 by Don Perking, RN Outcome: Progressing Goal: Will remain free from infection 09/21/2019 1250 by Don Perking, RN Outcome: Completed/Met 09/21/2019 0938 by Don Perking, RN Outcome: Progressing Goal: Diagnostic test results will improve 09/21/2019 1250 by Don Perking, RN Outcome: Completed/Met 09/21/2019 0938 by Don Perking, RN Outcome: Progressing Goal: Respiratory complications will improve 09/21/2019 1250 by Don Perking, RN Outcome: Completed/Met 09/21/2019 0938 by Don Perking, RN Outcome: Progressing Goal: Cardiovascular complication will be avoided 09/21/2019 1250 by Don Perking, RN Outcome: Completed/Met 09/21/2019 0938 by Don Perking, RN Outcome: Progressing   Problem: Activity: Goal: Risk for activity intolerance will decrease 09/21/2019 1250 by Don Perking, RN Outcome: Completed/Met 09/21/2019 0938 by Don Perking, RN Outcome: Progressing   Problem: Nutrition: Goal: Adequate nutrition will be maintained 09/21/2019 1250 by Don Perking, RN Outcome: Completed/Met 09/21/2019 0938 by Don Perking, RN Outcome: Progressing   Problem: Coping: Goal: Level  of anxiety will decrease 09/21/2019 1250 by Don Perking, RN Outcome: Completed/Met 09/21/2019 0938 by Don Perking, RN Outcome: Progressing   Problem: Elimination: Goal: Will not experience complications related to bowel motility 09/21/2019 1250 by Don Perking, RN Outcome: Completed/Met 09/21/2019 0938 by Don Perking, RN Outcome: Progressing Goal: Will not experience complications related to urinary retention 09/21/2019 1250 by Don Perking, RN Outcome: Completed/Met 09/21/2019 0938 by Don Perking, RN Outcome: Progressing   Problem: Pain Managment: Goal: General experience of comfort will improve 09/21/2019 1250 by Don Perking, RN Outcome: Completed/Met 09/21/2019 0938 by Don Perking, RN Outcome: Progressing   Problem: Safety: Goal: Ability to remain free from injury will improve 09/21/2019 1250 by Don Perking, RN Outcome: Completed/Met 09/21/2019 0938 by Don Perking, RN Outcome: Progressing   Problem: Skin Integrity: Goal: Risk for impaired skin integrity will decrease 09/21/2019 1250 by Don Perking, RN Outcome: Completed/Met 09/21/2019 0938 by Don Perking, RN Outcome: Progressing

## 2019-09-21 NOTE — Progress Notes (Signed)
Discussed and explained discharge instructions with patient. Prescriptions sent electronically to [facility] in [place], [place]. Follow up appointments made and given to patient. Gauze and tape given for dressing change of chest incision. Lower part of chest incision oozing small amount. Patient going home with wife.

## 2019-09-21 NOTE — Anesthesia Postprocedure Evaluation (Signed)
Anesthesia Post Note  Patient: Brian Chapman  Procedure(s) Performed: CORONARY ARTERY BYPASS GRAFTING (CABG), ON PUMP, TIMES FOUR, USING LEFT INTERNAL MAMMARY ARTERY AND RIGHT GREATER SAPHENOUS VEIN HARVESTED ENDOSCOPICALLY (N/A Chest) TRANSESOPHAGEAL ECHOCARDIOGRAM (TEE) (N/A )     Patient location during evaluation: SICU Anesthesia Type: General Level of consciousness: patient remains intubated per anesthesia plan Pain management: pain level controlled Vital Signs Assessment: post-procedure vital signs reviewed and stable Respiratory status: patient remains intubated per anesthesia plan Cardiovascular status: stable Postop Assessment: no apparent nausea or vomiting Anesthetic complications: no    Last Vitals:  Vitals:   09/21/19 1215 09/21/19 1225  BP: (!) 99/46 (!) 109/58  Pulse: 70 67  Resp: 18 18  Temp:    SpO2: 95% 94%    Last Pain:  Vitals:   09/21/19 1209  TempSrc: Oral  PainSc: 0-No pain                 Brian Chapman

## 2019-09-25 MED FILL — Electrolyte-R (PH 7.4) Solution: INTRAVENOUS | Qty: 4000 | Status: AC

## 2019-09-25 MED FILL — Lidocaine HCl Local Soln Prefilled Syringe 100 MG/5ML (2%): INTRAMUSCULAR | Qty: 5 | Status: AC

## 2019-09-25 MED FILL — Heparin Sodium (Porcine) Inj 1000 Unit/ML: INTRAMUSCULAR | Qty: 20 | Status: AC

## 2019-09-25 MED FILL — Sodium Bicarbonate IV Soln 8.4%: INTRAVENOUS | Qty: 50 | Status: AC

## 2019-09-25 MED FILL — Sodium Chloride IV Soln 0.9%: INTRAVENOUS | Qty: 2000 | Status: AC

## 2019-09-25 MED FILL — Mannitol IV Soln 20%: INTRAVENOUS | Qty: 500 | Status: AC

## 2019-09-27 DIAGNOSIS — G4733 Obstructive sleep apnea (adult) (pediatric): Secondary | ICD-10-CM | POA: Diagnosis not present

## 2019-10-01 ENCOUNTER — Other Ambulatory Visit: Payer: Self-pay | Admitting: Cardiothoracic Surgery

## 2019-10-01 DIAGNOSIS — Z951 Presence of aortocoronary bypass graft: Secondary | ICD-10-CM

## 2019-10-02 ENCOUNTER — Other Ambulatory Visit: Payer: Self-pay

## 2019-10-02 ENCOUNTER — Ambulatory Visit
Admission: RE | Admit: 2019-10-02 | Discharge: 2019-10-02 | Disposition: A | Discharge status: Home / Self Care | Payer: BC Managed Care – PPO | Source: Ambulatory Visit | Attending: Cardiothoracic Surgery | Admitting: Cardiothoracic Surgery

## 2019-10-02 ENCOUNTER — Ambulatory Visit (INDEPENDENT_AMBULATORY_CARE_PROVIDER_SITE_OTHER): Payer: Self-pay | Admitting: Cardiothoracic Surgery

## 2019-10-02 ENCOUNTER — Other Ambulatory Visit: Payer: Self-pay | Admitting: *Deleted

## 2019-10-02 VITALS — BP 157/90 | HR 94 | Temp 97.5°F | Resp 20 | Ht 66.0 in | Wt 203.0 lb

## 2019-10-02 DIAGNOSIS — Z951 Presence of aortocoronary bypass graft: Secondary | ICD-10-CM

## 2019-10-02 DIAGNOSIS — E1159 Type 2 diabetes mellitus with other circulatory complications: Secondary | ICD-10-CM

## 2019-10-02 DIAGNOSIS — R531 Weakness: Secondary | ICD-10-CM | POA: Diagnosis not present

## 2019-10-02 MED ORDER — ONETOUCH ULTRASOFT LANCETS MISC: 12 refills | Status: AC

## 2019-10-02 MED ORDER — GLUCOSE BLOOD VI STRP: ORAL_STRIP | 12 refills | Status: AC

## 2019-10-02 NOTE — Progress Notes (Signed)
[address]       [place] [address]             [phone]     CARDIOTHORACIC SURGERY OFFICE NOTE  Referring Provider is Phil Dopp, MD Primary Cardiologist is Lorine Bears, MD [facility]   HPI:  74 yo man underwent urgent CABG 2 weeks ago for STEMI. Did well and was discharged home a few days later. Presents for 1st outpatient visit since discharge. He reports being confused about outpatient medication regimen and has been holding BP meds. Predictably, his BP has been elevated. He has also not been checking blood glucose. He reports feeling weak and his appetite is poor.    Current Outpatient Medications  Medication Sig Dispense Refill  . amiodarone (PACERONE) 200 MG tablet Take 1 tablet (200 mg total) by mouth 2 (two) times daily. 30 tablet 1  . clopidogrel (PLAVIX) 75 MG tablet Take 1 tablet (75 mg total) by mouth daily. 30 tablet 2  . colchicine 0.6 MG tablet Take 1 tablet (0.6 mg total) by mouth daily. 30 tablet 0  . metFORMIN (GLUCOPHAGE) 500 MG tablet Take 500 mg by mouth daily with breakfast.    . metoprolol tartrate (LOPRESSOR) 25 MG tablet Take 12.5 mg by mouth 2 (two) times daily.    Marland Kitchen allopurinol (ZYLOPRIM) 300 MG tablet Take 300 mg by mouth daily.    Marland Kitchen aspirin EC 81 MG tablet Take 81 mg by mouth daily.    Marland Kitchen atorvastatin (LIPITOR) 80 MG tablet Take 80 mg by mouth daily.    . cyclobenzaprine (FLEXERIL) 5 MG tablet Take 1 tablet (5 mg total) by mouth 3 (three) times daily as needed for muscle spasms. (Patient not taking: Reported on 10/02/2019) 15 tablet 0  . desonide (DESOWEN) 0.05 % cream Apply 1 application topically 2 (two) times daily.    . diclofenac Sodium (VOLTAREN) 1 % GEL Apply 4 g topically 4 (four) times daily.    . indomethacin (INDOCIN) 50 MG capsule Take 50 mg by mouth daily as needed. Gout pain    . ketoconazole (NIZORAL) 2 % shampoo Apply 1 application topically 2 (two) times a week.     No current  facility-administered medications for this visit.      Physical Exam:   BP (!) 157/90   Pulse 94   Temp (!) 97.5 F (36.4 C) (Temporal)   Resp 20   Ht 5\' 6"  (1.676 m)   Wt 92.1 kg   SpO2 98% Comment: RA  BMI 32.77 kg/m   General:  Well-appearing, NAD  Chest:   cta  CV:   rrr  Incisions:  Well-healing  Abdomen:  sntnd  Extremities:  No edema  Diagnostic Tests:  cxr w/clear lung fields  Impression:  Doing reasonably well after CABG despite errors in medical adherence  Plan:  I have emphasized the need to take meds as prescrbed. Will f/u in 2 weeks for subsequent examination.  I spent in excess of 20 minutes during the conduct of this office consultation and >50% of this time involved direct face-to-face encounter with the patient for counseling and/or coordination of their care.  Level 2                 10 minutes Level 3                 15 minutes Level 4  25 minutes Level 5                 40 minutes  B. Murvin Natal, MD 10/02/2019 12:05 PM

## 2019-10-11 NOTE — Progress Notes (Signed)
Office Visit    Patient Name: Brian Chapman Date of Encounter: 10/13/2019  Primary Care Provider:  [facility] Primary Cardiologist:  Lorine Bears, MD  Chief Complaint    74 year old male with history of history of CAD, STEMI 09/16/19, CABG x4 09/17/19(LIMA-LAD, SVG-diagonal, SVG-OM1 to OM2), possible post CABG AF, DM2, hypertension, hyperlipidemia, former smoker, obesity, and who is being seen today after 09/16/2019 STEMI with need for 09/17/19 CABG x4 (LIMA-LAD, SVG-diagonal, SVG-OM1 to OM2).  Past Medical History    Past Medical History:  Diagnosis Date  . Diabetes mellitus without complication (HCC)   . Hyperlipidemia   . Hypertension   . OSA on CPAP    wears CPAP qHS per pt   Past Surgical History:  Procedure Laterality Date  . CARDIAC CATHETERIZATION    . CORONARY ARTERY BYPASS GRAFT N/A 09/17/2019   Procedure: CORONARY ARTERY BYPASS GRAFTING (CABG), ON PUMP, TIMES FOUR, USING LEFT INTERNAL MAMMARY ARTERY AND RIGHT GREATER SAPHENOUS VEIN HARVESTED ENDOSCOPICALLY;  Surgeon: Linden Dolin, MD;  Location: [facility] OR;  Service: Open Heart Surgery;  Laterality: N/A;  . CORONARY/GRAFT ACUTE MI REVASCULARIZATION N/A 09/16/2019   Procedure: Coronary/Graft Acute MI Revascularization;  Surgeon: Iran Ouch, MD;  Location: [facility] INVASIVE CV LAB;  Service: Cardiovascular;  Laterality: N/A;  . LEFT HEART CATH AND CORONARY ANGIOGRAPHY N/A 09/16/2019   Procedure: LEFT HEART CATH AND CORONARY ANGIOGRAPHY;  Surgeon: Iran Ouch, MD;  Location: [facility] INVASIVE CV LAB;  Service: Cardiovascular;  Laterality: N/A;  . TEE WITHOUT CARDIOVERSION N/A 09/17/2019   Procedure: TRANSESOPHAGEAL ECHOCARDIOGRAM (TEE);  Surgeon: Linden Dolin, MD;  Location: [facility] OR;  Service: Open Heart Surgery;  Laterality: N/A;  . TONSILLECTOMY    . VASECTOMY      Allergies  Allergies  Allergen Reactions  . Lisinopril Cough    History of Present Illness    74 year old male with history as  above and who reportedly mostly follows at the [place].  He was recently admitted to [facility] with [facility] cardiology consulted and need for CABG x4.  At that time, he noted previous cardiac catheterization and mild CAD; however, details unknown and records not available per EMR.  He was working as a Production designer, theatre/television/film at [employer].  It was also noted at that time that the patient had a recent COVID-19 infection in November and was sick for approximately 10 days but did not require hospitalization.  He had no residual symptoms.  On the day of his presentation, he was on his way to work when he started to have severe substernal chest tightness and heaviness with associated shortness of breath.  Onset was 1 hour before his presentation.  In the ED, he was given sublingual nitro and unfractioned heparin with some improvement in his chest pain.  Initial EKG showed possible hyperacute T waves in the anterior leads, confirmed a subsequent EKG with 1 to 2 mm of ST elevation starting from V3 to V5.  He was also noted to have inferior ST elevation on telemetry.  Given his symptoms and EKG findings, emergent cardiac catheterization was recommended with possible PCI.  Cardiac catheterization was performed 09/16/19 (same day as presentation), and he was found to have severe distal left main stenosis with plaque rupture, extending into the ostial LAD and occluded distal LAD, likely from embolization.  He had mild to moderate pRCA stenosis. He underwent POBA of distal LAD. TTE showed EF 45 to 50% and with further details as outlined / copied and  pasted in studies below. He then was transferred to [facility] CTS for CABG x4 as copied and pasted below in detail. CABG x4 by Dr. Renaldo Fiddler took place on 09/17/19 (LIMA-LAD, SVG-diagonal, SVG-OM1 to OM2).  Right sided EVH was from the right thigh and lower leg. Operative findings were of preserved LVSF and diffuse disease consistent with underlying DM2. Rigid sternal reconstruction was performed with  linear plating system. He was continued on ASA, atorvastatin, metoprolol. AF was noted on telemetry during the admission, though at discharge, no clear AF was noted and instead noted were PACs. At follow-up on 2/22 with CTS / Dr. [name], it was noted that he was holding some of his BP medications after discharge, which lead to his BP elevated at RTC. He felt weak with poor appetite. BP 157/90 with HR 94.   Since that time, he reports he has been doing well though somewhat fatigued and still working to get his energy back. He has been monitoring his blood sugar and BP with log printed and submitted today. Per home BP log, SBP 102-132 with DBP 68-87. He reports that he sometimes is not eating regularly (as noted in CTS note at first follow-up 2/22), and that his appetite is not always consistent. He denies any CP, racing HR, palpitations, presyncope, or syncope.Marland Kitchen He reports significant ongoing insomnia, which he feels is contributing to his weakness and fatigue. [facility] he has been unable to obtain sleeping medication with plan to reach out to his primary cardiologist to discuss as outline below. He reports right sided neck pain today and where he received his central line (refer to note for right IJ Swan - Ganz catheter) and admits this may be worsened by shaving the scab with his razor each AM. He denies any s/sx of infection in the areas of his median sternotomy incision, R sided EVH incision, and R/L radial catheterization arteriotomy sites.  He has not yet been scheduled with cardiac rehab with plan to call for setup today. He was not discharged with SL nitro with prescription supplied today. BP today still elevated and sub-optimal with patient reporting it is likely 2/2 the long walk as it is well controlled at home. He expresses understanding regarding need for ongoing HR/BP/glucose control. He reports medication compliance. He denies hematochezia, hematuria, or hemoptysis. He  does note melena with plan for CBC as below. He also reports diarrhea often, as well as stools that are not fully formed, and with plan for BMET below. He has not yet returned to work but hopes to in the near future. He expresses concern that he is still weak, given he needs to be fairly active at work as walks long distances. Medical records from previous cardiologist reviewed today, as well as BP/glucose log and will be initialed to be scanned into EMR.  Home Medications    Prior to Admission medications   Medication Sig Start Date End Date Taking? Authorizing Provider  allopurinol (ZYLOPRIM) 300 MG tablet Take 300 mg by mouth daily.    [provider]  amiodarone (PACERONE) 200 MG tablet Take 1 tablet (200 mg total) by mouth 2 (two) times daily. 09/21/19   Leary Roca, PA-C  aspirin EC 81 MG tablet Take 81 mg by mouth daily.    [provider]  atorvastatin (LIPITOR) 80 MG tablet Take 80 mg by mouth daily.    [provider]  clopidogrel (PLAVIX) 75 MG tablet Take 1 tablet (  75 mg total) by mouth daily. 09/22/19   Leary Roca, PA-C  colchicine 0.6 MG tablet Take 1 tablet (0.6 mg total) by mouth daily. 09/22/19   Leary Roca, PA-C  cyclobenzaprine (FLEXERIL) 5 MG tablet Take 1 tablet (5 mg total) by mouth 3 (three) times daily as needed for muscle spasms. Patient not taking: Reported on 10/02/2019 06/30/19   Joni Reining, PA-C  desonide (DESOWEN) 0.05 % cream Apply 1 application topically 2 (two) times daily.    [provider]  diclofenac Sodium (VOLTAREN) 1 % GEL Apply 4 g topically 4 (four) times daily.    [provider]  glucose blood test strip Test blood ac and hs...the patient has been instructed. 10/02/19   Linden Dolin, MD  indomethacin (INDOCIN) 50 MG capsule Take 50 mg by mouth daily as needed. Gout pain    [provider]  ketoconazole (NIZORAL) 2 % shampoo Apply 1 application topically 2 (two) times a  week.    [provider]  Lancets [facility] ULTRASOFT) lancets Use as instructed...test ac and hs. 10/02/19   Linden Dolin, MD  metFORMIN (GLUCOPHAGE) 500 MG tablet Take 500 mg by mouth daily with breakfast.    [provider]  metoprolol tartrate (LOPRESSOR) 25 MG tablet Take 12.5 mg by mouth 2 (two) times daily.    [provider]    Review of Systems    He denies chest pain, palpitations, pnd, orthopnea, n, v, dizziness, syncope, edema, weight gain. He reports ongoing fatigue and weakness. He has had irregular appetite and sometimes does not every meal. In early AM, he occasionally experiences dizziness with position changes, which improves with slower transition from laying to sitting / standing. He experiences DOE with longer walks, such as walking into the building. He has been suffering from insomnia. He reports melena and diarrhea, as well as softer stools. All other systems reviewed and are otherwise negative except as noted above.  Physical Exam    VS:  BP (!) 144/90 (BP Location: Left Arm, Patient Position: Sitting, Cuff Size: Normal)   Pulse 67   Ht  (1.676 m)   Wt 204 lb (92.5 kg)   SpO2 98%   BMI 32.93 kg/m  , BMI Body mass index is 32.93 kg/m. GEN: Well nourished, well developed, in no acute distress. HEENT: normal. Neck: Supple, no JVD, carotid bruits, or masses. Right sided central line scab noted but does not appear to be infected and without any thrill/bruit or warmth (continue to monitor).  Cardiac: RRR, no murmurs or rubs with faint S4. No clubbing, cyanosis, or significant edema (as below, mild R sided non-pitting edema, associated with EVH).  Radials/DP/PT 2+ and equal bilaterally.  Right / left radial and arteriotomy sites without infection. Median sternotomy site healing well and without signs of infection. R EVH incision sites healing well and with mild but non-pitting R sided edema.  Respiratory:  Respirations regular and  unlabored, clear to auscultation bilaterally. GI: Soft, nontender, nondistended, BS + x 4.  MS: no deformity or atrophy. Skin: warm and dry, no rash. Surgical sites healing well as outlined above. Neuro:  Strength and sensation are intact. Psych: Normal affect.  Accessory Clinical Findings    ECG personally reviewed by me today - SR with first degree AVB 67bpm, previous anterolateral ischemia presents on EKG but improved from previous EKG, Q waves in inferior leads, baseline wander- no acute changes from previous.  VITALS Reviewed   Temp Readings  from Last 3 Encounters:  10/02/19 (!) 97.5 F (36.4 C) (Temporal)  09/21/19 98.5 F (36.9 C) (Oral)  09/16/19 98.4 F (36.9 C) (Oral)   BP Readings from Last 3 Encounters:  10/13/19 (!) 144/90  10/02/19 (!) 157/90  09/21/19 (!) 109/58   Pulse Readings from Last 3 Encounters:  10/13/19 67  10/02/19 94  09/21/19 67    Wt Readings from Last 3 Encounters:  10/13/19 204 lb (92.5 kg)  10/02/19 203 lb (92.1 kg)  09/21/19 220 lb 3.8 oz (99.9 kg)     LABS  reviewed   [place] present? Yes/No: No  Lab Results  Component Value Date   WBC 11.5 (H) 09/21/2019   HGB 10.8 (L) 09/21/2019   HCT 32.6 (L) 09/21/2019   MCV 87.6 09/21/2019   PLT 184 09/21/2019   Lab Results  Component Value Date   CREATININE 1.26 (H) 09/21/2019   BUN 22 09/21/2019   NA 136 09/21/2019   K 3.8 09/21/2019   CL 99 09/21/2019   CO2 26 09/21/2019   No results found for: ALT, AST, GGT, ALKPHOS, BILITOT No results found for: CHOL, HDL, LDLCALC, LDLDIRECT, TRIG, CHOLHDL  Lab Results  Component Value Date   HGBA1C 6.6 (H) 09/21/2019   No results found for: TSH   STUDIES/PROCEDURES reviewed    09/17/19 CABGx4  Coronary Artery Bypass Grafting x 4             Left Internal Mammary Artery to Distal Left Anterior Descending Coronary Artery; Saphenous Vein Graft to right Posterolateral Coronary Artery and 1st Obtuse Marginal Branch of Left  Circumflex Coronary Artery as a sequenced graft; Sapheonous Vein Graft to 1st  Diagonal Branch Coronary Artery; Endoscopic Vein Harvest from rightThigh and Lower Leg;  Rigid sternal reconstruction with linear plating system Operative Findings: ? preserved left ventricular systolic function ? good quality left internal mammary artery conduit ? Good quality saphenous vein conduit ? fair quality target vessels for grafting due to diffuse disease consistent with underlying diabetes  The 1st diagonal branch of the left anterior descending coronary artery was grafted using a reversed saphenous vein graft in an end-to-side fashion.  At the site of distal anastomosis the target vessel was fair quality and measured approximately 1.5 mm in diameter.  The  right posterolateral branch of the right coronary artery was grafted using a reversed saphenous vein graft in an end-to-side fashion.  At the site of distal anastomosis the target vessel was fair quality and measured approximately 1.5 mm in diameter. Next, the 1st  obtuse marginal branch of the left circumflex coronary artery was grafted  in an side-to-side fashion to create a sequenced graft configuration with the Right PLA.  At the site of distal anastomosis the target vessel was fair quality and measured approximately 1.5 mm in diameter..  The distal left anterior coronary artery was grafted with the left internal mammary artery in an end-to-side fashion.  At the site of distal anastomosis the target vessel was fair quality and measured approximately 1.5 mm in diameter. All proximal vein graft anastomoses were placed directly to the ascending aorta prior to removal of the aortic cross clamp.  The septal myocardial temperature rose rapidly after reperfusion of the left internal mammary artery graft. Deairing procedures were performed and the aortic cross clamp was removed  2/7 Intraoperative TEE PRE-OP FINDINGS  Left Ventricle: The left ventricle has  mild-moderately reduced systolic  function, with an ejection fraction of 40-45%.  Right Ventricle: The right ventricle  has normal systolic function.  Left Atrium: The left atrial appendage is well visualized and there is  evidence of thrombus present. The left atrial appendage is well visualized  and there is no evidence of thrombus present.  Right Atrium: Right atrial pressure is estimated at 10 mmHg.  Mitral Valve: The mitral valve is normal in structure. Mitral valve  regurgitation is trivial by color flow Doppler.  Tricuspid Valve: The tricuspid valve was normal in structure. Tricuspid  valve regurgitation is trivial by color flow Doppler.  Dorris Singh MD  Electronically signed by Dorris Singh MD  Signature Date/Time: 09/21/2019  2/7 US Doppler pre CABG Carotid and [place] Summary:  Right Carotid: The extracranial vessels were near-normal with only minimal  wall  thickening or plaque.  Left Carotid: The extracranial vessels were near-normal with only minimal  wall thickening or plaque.  Vertebrals: Bilateral vertebral arteries demonstrate antegrade flow.  Subclavians: Not assessed.  Right Upper Extremity: Doppler waveforms remain within normal limits with  right radial compression. Doppler waveforms decrease <50% with right ulnar  compression.  Left Upper Extremity: Doppler waveforms remain within normal limits with  left radial compression. Doppler waveforms remain within normal limits  with left ulnar compression.   Cath 09/16/19: 1. Severe distal left main stenosis with plaque rupture extending into the ostial LAD with occluded distal LAD likely due to embolization. Mild to moderate proximal RCA stenosis. Surgical targets include mid LAD, first diagonal and OM 3. 2. Normal LV systolic function and moderately elevated left ventricular end-diastolic pressure. 3. Successful balloon angioplasty to the distal LAD to establish TIMI-3 flow. Cardiac catheterization was performed  via the right radial artery. 4. Successful intra-aortic balloon pump placement via the right common femoral artery. Recommendations: Given this the left main involvement, diffuse LAD disease and diabetic status, I think surgical revascularization with urgent/emergent CABG is indicated. The patient did not receive any oral antiplatelet medication other than aspirin. Continue unfractionated heparin. I discussed the case with Dr. [name].  TTE 09/16/19: 1. Left ventricular ejection fraction, by visual estimation, is 45 to  50%. The left ventricle has hyperdynamic function. There is severely  increased left ventricular hypertrophy.  2. Definity contrast agent was given IV to delineate the left ventricular  endocardial borders.  3. The left ventricle demonstrates regional wall motion abnormalities.  4. Severe akinesis of the left ventricular, apical apical segment and  inferoapical and distal anteroseptal/anteroapical walls. No apical  thrombus.  5. Left ventricular diastolic parameters are consistent with Grade I  diastolic dysfunction (impaired relaxation).  6. Global right ventricle has mildly reduced systolic function.The right  ventricular size is normal. No increase in right ventricular wall  thickness.  7. Hypokinetic right ventricular apex.  8. Left atrial size was normal.  9. Right atrial size was normal.  10. Mild mitral annular calcification.  11. The mitral valve is grossly normal. Trivial mitral valve  regurgitation.  12. The tricuspid valve is grossly normal.  13. The tricuspid valve is grossly normal. Tricuspid valve regurgitation  is trivial.  14. The aortic valve is tricuspid. Aortic valve regurgitation is not  visualized. Mild aortic valve sclerosis without stenosis.  15. The pulmonic valve was grossly normal. Pulmonic valve regurgitation is  not visualized.  16. The interatrial septum was not well visualized.   Assessment & Plan    CAD (LIMA-LAD,  SVG-diagonal, SVG-OM1 to OM2) S/p 2/6 STEMI S/p 2/7 CABG x4  --No CP. Continues to note fatigue and weakness with plan to reach out  to cardiac rehab and schedule as he is agreeable to rehab after outlining the program toay. BP sub-optimal today with patient reporting that this is due to his walk into the building and that it is well controlled to soft at home. At next RTC, recommend addition of ARB as tolerated (given noted Lisinopril cough intolerance per review of EMR) with ongoing room in pressure and per GDMT. Escalation of BB precluded by current HR. Previous echo and intraoperative TEE above discussed with EF reduced 45-50%, and  intraoperative report documents preserved LVSF. Will update echo in 2-3 months to reassess EF and wall motion s/p revascularization. Continue DAPT with ASA and Plavix. Given report of melena on DAPT, obtian CBC. Given report of diarrhea, obtian BMET to reassess electrolytes and given report of post-operative PAF versus PACs. Recommend strict risk factor modification with statin and lifestyle changes, as well as completion of cardiac rehab. Consider recheck of LDL at RTC with goal LDL <70 and addition of Zetia or refer to lipid clinic if needed. Strict glucose / BP control with ongoing log encouraged.  Prescribed PRN SL nitro, given not yet on medication list.  Plan: Obtain CBC, BMET. Prescribe PRN SL nitro. Continue DAPT, BB, and statin. Ensure enrolled in cardiac rehab. Update echo in 2-3 months. Escalate GDMT at RTC with ARB if tolerated (cough intolerance to lisinopril). Recheck LDL at RTC and addition of Zetia or lipid clinic if LDL not at goal. Follow-up with CTS as scheduled.   HFrEF (EF 45-50%) --No SOB at rest, some DOE during walk into office today. Euvolemic on exam. TTE/TEE as above with EF 45-50% and plan to repeat s/p revascularization as outlined above. Cardiac rehab recommended with patient agreeable as above and to be scheduled. He is not on a standing diuretic  and no indication for start of diuretic today but agreeable to monitor daily weights and call if he notes any signs of volume overload (as reviewed today in clinic). Per EMR, noted intolerance to ACE (cough). Consider addition of ARB if tolerated at follow-up per GDMT and if room in BP and renal function allows. Will defer for now and given his report of softer pressures at home. Obtain BMET as above. Reassess BP log and daily weights at RTC.   Plan: See above. Reassess BP / weights at RTC and need for standing diuretic and tolerance of ARB (cough with lisinopril). Future echo as above.  HTN --BP sub-optimal today with patient reporting 2/2 walk into clinic. Given elevated at CTS visit, will need to have low threshold to start ARB (cough allergy listed under ACE on review of EMR) or additional antihypertensive at RTC if BP still sub-optimal. He will continue to monitor home BP with log as reviewed today. Also recommended was for monitoring of home weights, as he is euvolemic on exam, but not on a standing diuretic. He will call the office if s/sx of volume overload. Current rate precludes escalation of BB. Discussed goal BP as under 130/80. Continue current medications for now.   Plan: Reassess trial ARB at RTC (report of ACE cough), diuretic if indicated, and titration of BB if HR allows.   HLD --Previously on high intensity statin and prior to most recent admission. Given need for aggressive risk factor modification, consider recheck of LDL at RTC and addition of Zetia or referral to lipid clinic if not at LDL goal <70. Will also want to obtain LFTs as none available on review of EMR and on amiodarone as below. Continue  high intensity statin.   Plan: At RTC, obtain LFTs (on amiodarone as well), LDL. If indicated, add Zetia or refer to lipid clinic.   DM2  --Most recent A1C not well controlled. Recommend continued close monitoring of glucose levels with log. Lifestyle changes. Continue metformin.  Monitoring per PCP. As above, he has a listed intolerance to ACE/ lisinopril but should consider trial of ARB at RTC per GDMT.   ?PAF versus PACs --NSR today. No extrasystole appreciated on exam. No racing HR or palpitations. Denies any presyncope or syncope. Continue amiodarone with guideline directed periodic monitoring of TSH (start, 3-4 months s/p start, then PRN), vision (annual), LFTs (start, 35mo s/p start, annually), CXR / PFTs (start, annually).  Future considerations could include Zio if sx reported or ongoing weakness / fatigue. Check BMET as above to r/o ectopy / arrhythmia 2/2 electrolyte imbalance.  Plan: Obtain BMET. At RTC, obtain TSH, LFTs. If indicated, Zio.   Insomnia --With ongoing issues, will reach out to primary cardiologist regarding ability to assist with sleeping as patient indicates it is difficult to connect with his PCP.   Plan: Discuss with primary cardiologist before next RTC.   Diarrhea --Recheck BMET and electrolytes, as imbalance in electrolytes increases risk of arrhythmia / ectopy.  Melena --Recheck CBC on DAPT.   R sided neck discomfort --Continue to monitor. No bruit heard on exam. No thrill.   Medication changes: Add PRN SL nitro.  Labs ordered: BMET/Mg, CBC.  Studies / Imaging ordered: None (see future recommendations as outlined in plans above) Disposition: RTC 2 weeks   Total time spent with patient today 45 minutes. This includes reviewing records, evaluating the patient, and coordinating care. Face-to-face time >50%.    Lennon Alstrom, PA-C 10/13/2019

## 2019-10-13 ENCOUNTER — Ambulatory Visit: Payer: Non-veteran care | Admitting: General Practice

## 2019-10-13 ENCOUNTER — Ambulatory Visit (INDEPENDENT_AMBULATORY_CARE_PROVIDER_SITE_OTHER): Payer: BC Managed Care – PPO | Admitting: Physician Assistant

## 2019-10-13 ENCOUNTER — Other Ambulatory Visit: Payer: Self-pay

## 2019-10-13 ENCOUNTER — Encounter: Payer: Self-pay | Admitting: Physician Assistant

## 2019-10-13 VITALS — BP 144/90 | HR 67 | Ht 66.0 in | Wt 204.0 lb

## 2019-10-13 DIAGNOSIS — I10 Essential (primary) hypertension: Secondary | ICD-10-CM

## 2019-10-13 DIAGNOSIS — Z951 Presence of aortocoronary bypass graft: Secondary | ICD-10-CM | POA: Diagnosis not present

## 2019-10-13 DIAGNOSIS — I480 Paroxysmal atrial fibrillation: Secondary | ICD-10-CM

## 2019-10-13 DIAGNOSIS — I2510 Atherosclerotic heart disease of native coronary artery without angina pectoris: Secondary | ICD-10-CM | POA: Diagnosis not present

## 2019-10-13 DIAGNOSIS — I5020 Unspecified systolic (congestive) heart failure: Secondary | ICD-10-CM

## 2019-10-13 DIAGNOSIS — K921 Melena: Secondary | ICD-10-CM

## 2019-10-13 DIAGNOSIS — E785 Hyperlipidemia, unspecified: Secondary | ICD-10-CM

## 2019-10-13 DIAGNOSIS — I252 Old myocardial infarction: Secondary | ICD-10-CM | POA: Diagnosis not present

## 2019-10-13 DIAGNOSIS — E119 Type 2 diabetes mellitus without complications: Secondary | ICD-10-CM

## 2019-10-13 MED ORDER — NITROGLYCERIN 0.4 MG SL SUBL: 0.4000 mg | SUBLINGUAL_TABLET | SUBLINGUAL | 3 refills | Status: AC | PRN

## 2019-10-13 NOTE — Patient Instructions (Addendum)
Medication Instructions:  -1)A prescription for nitroglycerin (NTG) 0.4 mg tablets was sent to your pharmacy. - this is just to have on hand to use as needed for chest pain  2)  no change otherwise  *If you need a refill on your cardiac medications before your next appointment, please call your pharmacy*   Lab Work: - Your physician recommends that you have lab work today: CBC/ BMP  If you have labs (blood work) drawn today and your tests are completely normal, you will receive your results only by: Marland Kitchen MyChart Message (if you have MyChart) OR . A paper copy in the mail If you have any lab test that is abnormal or we need to change your treatment, we will call you to review the results.   Testing/Procedures: - none ordered   Follow-Up: At [facility], you and your health needs are our priority.  As part of our continuing mission to provide you with exceptional heart care, we have created designated Provider Care Teams.  These Care Teams include your primary Cardiologist (physician) and Advanced Practice Providers (APPs -  Physician Assistants and Nurse Practitioners) who all work together to provide you with the care you need, when you need it.  We recommend signing up for the patient portal called "MyChart".  Sign up information is provided on this After Visit Summary.  MyChart is used to connect with patients for Virtual Visits (Telemedicine).  Patients are able to view lab/test results, encounter notes, upcoming appointments, etc.  Non-urgent messages can be sent to your provider as well.   To learn more about what you can do with MyChart, go to [email or web address].    Your next appointment:   2 week(s)  The format for your next appointment:   In Person  Provider:    You may see Lorine Bears, MD or one of the following Advanced Practice Providers on your designated Care Team:    Nicolasa Ducking, NP  Eula Listen, PA-C  Marisue Ivan, PA-C    Other  Instructions - We have left a message for Cardiac Rehab to please reach out to you about initiating your rehab

## 2019-10-14 ENCOUNTER — Encounter: Payer: Self-pay | Admitting: Physician Assistant

## 2019-10-14 LAB — CBC WITH DIFFERENTIAL/PLATELET
Basophils Absolute: 0.1 10*3/uL (ref 0.0–0.2)
Basos: 1 %
EOS (ABSOLUTE): 0.2 10*3/uL (ref 0.0–0.4)
Eos: 2 %
Hematocrit: 42.9 % (ref 37.5–51.0)
Hemoglobin: 13.7 g/dL (ref 13.0–17.7)
Immature Grans (Abs): 0 10*3/uL (ref 0.0–0.1)
Immature Granulocytes: 0 %
Lymphocytes Absolute: 2.2 10*3/uL (ref 0.7–3.1)
Lymphs: 29 %
MCH: 27.8 pg (ref 26.6–33.0)
MCHC: 31.9 g/dL (ref 31.5–35.7)
MCV: 87 fL (ref 79–97)
Monocytes Absolute: 1 10*3/uL — ABNORMAL HIGH (ref 0.1–0.9)
Monocytes: 13 %
Neutrophils Absolute: 4.1 10*3/uL (ref 1.4–7.0)
Neutrophils: 55 %
Platelets: 337 10*3/uL (ref 150–450)
RBC: 4.92 x10E6/uL (ref 4.14–5.80)
RDW: 13.5 % (ref 11.6–15.4)
WBC: 7.6 10*3/uL (ref 3.4–10.8)

## 2019-10-14 LAB — BASIC METABOLIC PANEL
BUN/Creatinine Ratio: 11 (ref 10–24)
BUN: 15 mg/dL (ref 8–27)
CO2: 23 mmol/L (ref 20–29)
Calcium: 9.6 mg/dL (ref 8.6–10.2)
Chloride: 105 mmol/L (ref 96–106)
Creatinine, Ser: 1.35 mg/dL — ABNORMAL HIGH (ref 0.76–1.27)
GFR calc Af Amer: 60 mL/min/{1.73_m2} (ref >59)
GFR calc non Af Amer: 52 mL/min/{1.73_m2} — ABNORMAL LOW (ref >59)
Glucose: 100 mg/dL — ABNORMAL HIGH (ref 65–99)
Potassium: 5.7 mmol/L — ABNORMAL HIGH (ref 3.5–5.2)
Sodium: 141 mmol/L (ref 134–144)

## 2019-10-16 ENCOUNTER — Ambulatory Visit (INDEPENDENT_AMBULATORY_CARE_PROVIDER_SITE_OTHER): Payer: Self-pay | Admitting: Cardiothoracic Surgery

## 2019-10-16 ENCOUNTER — Encounter: Payer: Self-pay | Admitting: Cardiothoracic Surgery

## 2019-10-16 ENCOUNTER — Other Ambulatory Visit
Admission: RE | Admit: 2019-10-16 | Discharge: 2019-10-16 | Disposition: A | Discharge status: Home / Self Care | Payer: No Typology Code available for payment source | Source: Ambulatory Visit | Attending: Physician Assistant | Admitting: Physician Assistant

## 2019-10-16 ENCOUNTER — Other Ambulatory Visit: Payer: Self-pay

## 2019-10-16 ENCOUNTER — Telehealth: Payer: Self-pay

## 2019-10-16 VITALS — BP 113/55 | HR 60 | Temp 97.3°F | Resp 20 | Ht 66.0 in | Wt 204.0 lb

## 2019-10-16 DIAGNOSIS — Z951 Presence of aortocoronary bypass graft: Secondary | ICD-10-CM

## 2019-10-16 DIAGNOSIS — E875 Hyperkalemia: Secondary | ICD-10-CM

## 2019-10-16 LAB — BASIC METABOLIC PANEL
Anion gap: 5 (ref 5–15)
BUN: 17 mg/dL (ref 8–23)
CO2: 27 mmol/L (ref 22–32)
Calcium: 8.9 mg/dL (ref 8.9–10.3)
Chloride: 104 mmol/L (ref 98–111)
Creatinine, Ser: 1.27 mg/dL — ABNORMAL HIGH (ref 0.61–1.24)
GFR calc Af Amer: >60 mL/min (ref >60)
GFR calc non Af Amer: 56 mL/min — ABNORMAL LOW (ref >60)
Glucose, Bld: 107 mg/dL — ABNORMAL HIGH (ref 70–99)
Potassium: 4.3 mmol/L (ref 3.5–5.1)
Sodium: 136 mmol/L (ref 135–145)

## 2019-10-16 NOTE — Progress Notes (Signed)
[address]       [place] [address]             [phone]     CARDIOTHORACIC SURGERY OFFICE NOTE  Referring Provider is Wellington Hampshire, MD Primary Cardiologist is Kathlyn Sacramento, MD [facility]   HPI:  74 yo man s/p urgent CABG one month ago. Here for 2nd f/u. No complaints of chest pain or SOB/DOE. Denies f/c. Ready to get back to work.    Current Outpatient Medications  Medication Sig Dispense Refill  . allopurinol (ZYLOPRIM) 300 MG tablet Take 300 mg by mouth daily.    Marland Kitchen amiodarone (PACERONE) 200 MG tablet Take 1 tablet (200 mg total) by mouth 2 (two) times daily. 30 tablet 1  . aspirin EC 81 MG tablet Take 81 mg by mouth daily.    Marland Kitchen atorvastatin (LIPITOR) 80 MG tablet Take 80 mg by mouth daily.    . betamethasone valerate lotion (VALISONE) 0.1 % Apply 1 application topically daily as needed for irritation.    . clopidogrel (PLAVIX) 75 MG tablet Take 1 tablet (75 mg total) by mouth daily. 30 tablet 2  . colchicine 0.6 MG tablet Take 1 tablet (0.6 mg total) by mouth daily. 30 tablet 0  . cyclobenzaprine (FLEXERIL) 5 MG tablet Take 1 tablet (5 mg total) by mouth 3 (three) times daily as needed for muscle spasms. 15 tablet 0  . desonide (DESOWEN) 0.05 % cream Apply 1 application topically 2 (two) times daily.    . diclofenac Sodium (VOLTAREN) 1 % GEL Apply 4 g topically 4 (four) times daily.    Marland Kitchen glucose blood test strip Test blood ac and hs...the patient has been instructed. 100 each 12  . indomethacin (INDOCIN) 50 MG capsule Take 50 mg by mouth daily as needed. Gout pain    . ketoconazole (NIZORAL) 2 % shampoo Apply 1 application topically 2 (two) times a week.    . Lancets (ONETOUCH ULTRASOFT) lancets Use as instructed...test ac and hs. 100 each 12  . metFORMIN (GLUCOPHAGE) 500 MG tablet Take 500 mg by mouth daily with breakfast.    . metoprolol tartrate (LOPRESSOR) 25 MG tablet Take 12.5 mg by mouth 2 (two) times daily.    .  nitroGLYCERIN (NITROSTAT) 0.4 MG SL tablet Place 1 tablet (0.4 mg total) under the tongue every 5 (five) minutes as needed for chest pain. 25 tablet 3   No current facility-administered medications for this visit.      Physical Exam:   BP (!) 113/55 (BP Location: Right Arm, Patient Position: Sitting, Cuff Size: Normal)   Pulse 60   Temp (!) 97.3 F (36.3 C) (Temporal)   Resp 20   Ht 5\' 6"  (1.676 m)   Wt 92.5 kg   SpO2 98% Comment: RA  BMI 32.93 kg/m   General:  Well-appearing, NAD  Chest:   cta  CV:   rrr  Incisions:  C/d/i  Abdomen:  sntnd  Extremities:  Well-perfused  Diagnostic Tests:  No new testing   Impression:  Doing very well after CABG  Plan: F/u as needed with thoracic surgery Ok to return to work part-time 3/15 Ok to drive today Stop amiodarone; keep track of BP--if elevated, could increase metoprolol  I spent in excess of 15 minutes during the conduct of this office consultation and >50% of this time involved direct face-to-face encounter with the patient for counseling and/or coordination of their care.  Level 2  10 minutes Level 3                 15 minutes Level 4                 25 minutes Level 5                 40 minutes  B. Lorayne Marek, MD 10/16/2019 1:14 PM

## 2019-10-16 NOTE — Telephone Encounter (Signed)
Spoke with Patient in regards to results. Patient verbalizes understanding of results and recommendation to come back in to have his BMET rechecked as soon as possible. Patient states that he will come back in today to have it re-checked.    Encouraged patient to call back with any questions or concerns.

## 2019-10-16 NOTE — Telephone Encounter (Signed)
-----   Message from Lennon Alstrom, PA-C sent at 10/14/2019  4:27 PM EST ----- Please let Brian Chapman know that his hemoglobin / blood counts are stable, which is reassuring. He should let us know if he continues to note dark, tarry, sticky stools. His potassium is high at 5.7. Please have him repeat a STAT BMET to confirm these potassium levels, as this could certainly be falsely elevated / pseudohyperkalemia but best to confirm with a repeat lab.

## 2019-10-17 ENCOUNTER — Other Ambulatory Visit: Payer: Self-pay

## 2019-10-17 ENCOUNTER — Encounter: Payer: No Typology Code available for payment source | Attending: Cardiovascular Disease

## 2019-10-17 DIAGNOSIS — Z951 Presence of aortocoronary bypass graft: Secondary | ICD-10-CM

## 2019-10-17 NOTE — Progress Notes (Signed)
Virtual Orientation performed. Patient informed when to come in for RD and EP orientation. Diagnosis can be found in [facility] 09/17/2019

## 2019-10-18 VITALS — Ht 66.0 in | Wt 201.9 lb

## 2019-10-18 DIAGNOSIS — Z951 Presence of aortocoronary bypass graft: Secondary | ICD-10-CM | POA: Diagnosis not present

## 2019-10-18 NOTE — Patient Instructions (Signed)
Patient Instructions  Patient Details  Name: Brian Chapman MRN: [id number] Date of Birth: 02-Nov-1945 Referring Provider:  Wellington Hampshire, MD  Below are your personal goals for exercise, nutrition, and risk factors. Our goal is to help you stay on track towards obtaining and maintaining these goals. We will be discussing your progress on these goals with you throughout the program.  Initial Exercise Prescription: Initial Exercise Prescription - 10/18/19 1300      Date of Initial Exercise RX and Referring Provider   Date  10/18/19    Referring Provider  Arida      Treadmill   MPH  2    Grade  0    Minutes  15    METs  2.5      Recumbant Bike   Level  2    RPM  60    Minutes  15    METs  2.2      Arm Ergometer   Level  1    RPM  30    Minutes  15    METs  2.2      REL-XR   Level  2    Speed  50    Minutes  15    METs  2.2      Prescription Details   Frequency (times per week)  3    Duration  Progress to 30 minutes of continuous aerobic without signs/symptoms of physical distress      Intensity   THRR 40-80% of Max Heartrate  95-130    Ratings of Perceived Exertion  11-13    Perceived Dyspnea  0-4      Resistance Training   Training Prescription  Yes    Weight  3 lb    Reps  10-15       Exercise Goals: Frequency: Be able to perform aerobic exercise two to three times per week in program working toward 2-5 days per week of home exercise.  Intensity: Work with a perceived exertion of 11 (fairly light) - 15 (hard) while following your exercise prescription.  We will make changes to your prescription with you as you progress through the program.   Duration: Be able to do 30 to 45 minutes of continuous aerobic exercise in addition to a 5 minute warm-up and a 5 minute cool-down routine.   Nutrition Goals: Your personal nutrition goals will be established when you do your nutrition analysis with the dietician.  The following are general nutrition  guidelines to follow: Cholesterol < 200mg /day Sodium < 1500mg /day Fiber: Men over 50 yrs - 30 grams per day  Personal Goals: Personal Goals and Risk Factors at Admission - 10/18/19 1319      Core Components/Risk Factors/Patient Goals on Admission    Weight Management  Yes;Weight Maintenance;Weight Loss    Admit Weight  201 lb 14.4 oz (91.6 kg)    Goal Weight: Short Term  190 lb (86.2 kg)    Goal Weight: Long Term  180 lb (81.6 kg)    Expected Outcomes  Short Term: Continue to assess and modify interventions until short term weight is achieved;Long Term: Adherence to nutrition and physical activity/exercise program aimed toward attainment of established weight goal;Weight Loss: Understanding of general recommendations for a balanced deficit meal plan, which promotes 1-2 lb weight loss per week and includes a negative energy balance of [id number] kcal/d;Understanding recommendations for meals to include 15-35% energy as protein, 25-35% energy from fat, 35-60% energy from carbohydrates, less than 200mg   of dietary cholesterol, 20-35 gm of total fiber daily;Understanding of distribution of calorie intake throughout the day with the consumption of 4-5 meals/snacks    Diabetes  Yes    Intervention  Provide education about signs/symptoms and action to take for hypo/hyperglycemia.;Provide education about proper nutrition, including hydration, and aerobic/resistive exercise prescription along with prescribed medications to achieve blood glucose in normal ranges: Fasting glucose 65-99 mg/dL    Expected Outcomes  Short Term: Participant verbalizes understanding of the signs/symptoms and immediate care of hyper/hypoglycemia, proper foot care and importance of medication, aerobic/resistive exercise and nutrition plan for blood glucose control.;Long Term: Attainment of HbA1C < 7%.       Tobacco Use Initial Evaluation: Social History   Tobacco Use  Smoking Status Former Smoker  . Packs/day: 0.25  . Years:  5.00  . Pack years: 1.25  . Types: Cigarettes  . Quit date: 21  . Years since quitting: 26.2  Smokeless Tobacco Never Used    Exercise Goals and Review: Exercise Goals    Row Name 10/18/19 1317             Exercise Goals   Increase Physical Activity  Yes       Intervention  Provide advice, education, support and counseling about physical activity/exercise needs.;Develop an individualized exercise prescription for aerobic and resistive training based on initial evaluation findings, risk stratification, comorbidities and participant's personal goals.       Expected Outcomes  Short Term: Attend rehab on a regular basis to increase amount of physical activity.;Long Term: Add in home exercise to make exercise part of routine and to increase amount of physical activity.;Long Term: Exercising regularly at least 3-5 days a week.       Increase Strength and Stamina  Yes       Intervention  Provide advice, education, support and counseling about physical activity/exercise needs.;Develop an individualized exercise prescription for aerobic and resistive training based on initial evaluation findings, risk stratification, comorbidities and participant's personal goals.       Expected Outcomes  Short Term: Increase workloads from initial exercise prescription for resistance, speed, and METs.;Short Term: Perform resistance training exercises routinely during rehab and add in resistance training at home;Long Term: Improve cardiorespiratory fitness, muscular endurance and strength as measured by increased METs and functional capacity ( )       Able to understand and use rate of perceived exertion (RPE) scale  Yes       Intervention  Provide education and explanation on how to use RPE scale       Expected Outcomes  Short Term: Able to use RPE daily in rehab to express subjective intensity level;Long Term:  Able to use RPE to guide intensity level when exercising independently       Able to understand and  use Dyspnea scale  Yes       Intervention  Provide education and explanation on how to use Dyspnea scale       Expected Outcomes  Short Term: Able to use Dyspnea scale daily in rehab to express subjective sense of shortness of breath during exertion;Long Term: Able to use Dyspnea scale to guide intensity level when exercising independently       Knowledge and understanding of Target Heart Rate Range (THRR)  Yes       Intervention  Provide education and explanation of THRR including how the numbers were predicted and where they are located for reference       Expected Outcomes  Short Term:  Able to state/look up THRR;Short Term: Able to use daily as guideline for intensity in rehab;Long Term: Able to use THRR to govern intensity when exercising independently       Able to check pulse independently  Yes       Intervention  Provide education and demonstration on how to check pulse in carotid and radial arteries.;Review the importance of being able to check your own pulse for safety during independent exercise       Expected Outcomes  Short Term: Able to explain why pulse checking is important during independent exercise;Long Term: Able to check pulse independently and accurately       Understanding of Exercise Prescription  Yes       Intervention  Provide education, explanation, and written materials on patient's individual exercise prescription       Expected Outcomes  Short Term: Able to explain program exercise prescription;Long Term: Able to explain home exercise prescription to exercise independently          Copy of goals given to participant.

## 2019-10-18 NOTE — Progress Notes (Signed)
Cardiac Individual Treatment Plan  Patient Details  Name: Brian Chapman MRN: [id number] Date of Birth: 05-Mar-1946 Referring Provider:     Cardiac Rehab from 10/18/2019 in [facility] Cardiac and Pulmonary Rehab  Referring Provider  Arida      Initial Encounter Date:    Cardiac Rehab from 10/18/2019 in [facility] Cardiac and Pulmonary Rehab  Date  10/18/19      Visit Diagnosis: S/P CABG x 4  Patient's Home Medications on Admission:  Current Outpatient Medications:  .  allopurinol (ZYLOPRIM) 300 MG tablet, Take 300 mg by mouth daily., Disp: , Rfl:  .  amiodarone (PACERONE) 200 MG tablet, Take 1 tablet (200 mg total) by mouth 2 (two) times daily., Disp: 30 tablet, Rfl: 1 .  aspirin EC 81 MG tablet, Take 81 mg by mouth daily., Disp: , Rfl:  .  atorvastatin (LIPITOR) 80 MG tablet, Take 80 mg by mouth daily., Disp: , Rfl:  .  betamethasone valerate lotion (VALISONE) 0.1 %, Apply 1 application topically daily as needed for irritation., Disp: , Rfl:  .  clopidogrel (PLAVIX) 75 MG tablet, Take 1 tablet (75 mg total) by mouth daily., Disp: 30 tablet, Rfl: 2 .  colchicine 0.6 MG tablet, Take 1 tablet (0.6 mg total) by mouth daily., Disp: 30 tablet, Rfl: 0 .  cyclobenzaprine (FLEXERIL) 5 MG tablet, Take 1 tablet (5 mg total) by mouth 3 (three) times daily as needed for muscle spasms., Disp: 15 tablet, Rfl: 0 .  desonide (DESOWEN) 0.05 % cream, Apply 1 application topically 2 (two) times daily., Disp: , Rfl:  .  diclofenac Sodium (VOLTAREN) 1 % GEL, Apply 4 g topically 4 (four) times daily., Disp: , Rfl:  .  glucose blood test strip, Test blood ac and hs...the patient has been instructed., Disp: 100 each, Rfl: 12 .  indomethacin (INDOCIN) 50 MG capsule, Take 50 mg by mouth daily as needed. Gout pain, Disp: , Rfl:  .  ketoconazole (NIZORAL) 2 % shampoo, Apply 1 application topically 2 (two) times a week., Disp: , Rfl:  .  Lancets (ONETOUCH ULTRASOFT) lancets, Use as instructed...test ac and hs., Disp: 100  each, Rfl: 12 .  metFORMIN (GLUCOPHAGE) 500 MG tablet, Take 500 mg by mouth daily with breakfast., Disp: , Rfl:  .  metoprolol tartrate (LOPRESSOR) 25 MG tablet, Take 12.5 mg by mouth 2 (two) times daily., Disp: , Rfl:  .  nitroGLYCERIN (NITROSTAT) 0.4 MG SL tablet, Place 1 tablet (0.4 mg total) under the tongue every 5 (five) minutes as needed for chest pain., Disp: 25 tablet, Rfl: 3  Past Medical History: Past Medical History:  Diagnosis Date  . CAD (coronary artery disease)    severe distal left main stenosis with plaque rupture, extending into the ostial LAD and occluded distal LAD, likely from embolization.  He had mild to moderate pRCA stenosis. He underwent POBA of distal LAD  . Diabetes mellitus without complication ([place])   . HFrEF (heart failure with reduced ejection fraction) (HCC)    2/6 TTE 45-50%, severe LVH, G1DD  . History of tobacco use   . Hyperlipidemia   . Hypertension   . OSA on CPAP    wears CPAP qHS per pt  . PAC (premature atrial contraction)    PAC verus post cabg AF on amiodarone s/p cath. Not on [place].  . S/P CABG x 4    2/7 LIMA-LAD, SVG-diagonal, SVG-OM1 to OM2    Tobacco Use: Social History   Tobacco Use  Smoking Status  Former Smoker  . Packs/day: 0.25  . Years: 5.00  . Pack years: 1.25  . Types: Cigarettes  . Quit date: 51  . Years since quitting: 26.2  Smokeless Tobacco Never Used    Labs: Recent Chemical engineer    Labs for ITP Cardiac and Pulmonary Rehab Latest Ref Rng & Units 09/17/2019 09/18/2019 09/18/2019 09/20/2019 09/21/2019   Hemoglobin A1c 4.8 - 5.6 % - - - 6.5(H) 6.6(H)   PHART 7.350 - 7.450 7.343(L) 7.320(L) 7.302(L) - -   PCO2ART 32.0 - 48.0 mmHg 41.2 42.0 44.7 - -   HCO3 20.0 - 28.0 mmol/L 22.4 21.3 21.7 - -   TCO2 22 - 32 mmol/L _0 - -   ACIDBASEDEF 0.0 - 2.0 mmol/L 3.0(H) 4.0(H) 4.0(H) - -   O2SAT % 95.0 95.0 92.0 - -       Exercise Target Goals: Exercise Program Goal: Individual exercise prescription set using  results from initial 6 min walk test and THRR while considering  patient's activity barriers and safety.   Exercise Prescription Goal: Initial exercise prescription builds to 30-45 minutes a day of aerobic activity, 2-3 days per week.  Home exercise guidelines will be given to patient during program as part of exercise prescription that the participant will acknowledge.  Activity Barriers & Risk Stratification:   6 Minute Walk: 6 Minute Walk    Row Name 10/18/19 1308         6 Minute Walk   Phase  Initial     Distance  1172 feet     Walk Time  6 minutes     # of Rest Breaks  0     MPH  2.22     METS  2.22     RPE  7     Perceived Dyspnea   0     VO2 Peak  7.8     Symptoms  No     Resting HR  60 bpm     Resting BP  112/60     Resting Oxygen Saturation   100 %     Exercise Oxygen Saturation  during 6 min walk  98 %     Max Ex. HR  91 bpm     Max Ex. BP  146/64     2 Minute Post BP  120/66        Oxygen Initial Assessment:   Oxygen Re-Evaluation:   Oxygen Discharge (Final Oxygen Re-Evaluation):   Initial Exercise Prescription: Initial Exercise Prescription - 10/18/19 1300      Date of Initial Exercise RX and Referring Provider   Date  10/18/19    Referring Provider  Arida      Treadmill   MPH  2    Grade  0    Minutes  15    METs  2.5      Recumbant Bike   Level  2    RPM  60    Minutes  15    METs  2.2      Arm Ergometer   Level  1    RPM  30    Minutes  15    METs  2.2      REL-XR   Level  2    Speed  50    Minutes  15    METs  2.2      Prescription Details   Frequency (times per week)  3    Duration  Progress to 30  minutes of continuous aerobic without signs/symptoms of physical distress      Intensity   THRR 40-80% of Max Heartrate  95-130    Ratings of Perceived Exertion  11-13    Perceived Dyspnea  0-4      Resistance Training   Training Prescription  Yes    Weight  3 lb    Reps  10-15       Perform Capillary Blood Glucose  checks as needed.  Exercise Prescription Changes: Exercise Prescription Changes    Row Name 10/18/19 1300             Response to Exercise   Blood Pressure (Admit)  112/60       Blood Pressure (Exercise)  146/64       Blood Pressure (Exit)  120/66       Heart Rate (Admit)  60 bpm       Heart Rate (Exercise)  91 bpm       Heart Rate (Exit)  67 bpm       Oxygen Saturation (Admit)  100 %       Oxygen Saturation (Exercise)  98 %       Rating of Perceived Exertion (Exercise)  7       Perceived Dyspnea (Exercise)  0       Symptoms  none          Exercise Comments:   Exercise Goals and Review: Exercise Goals    Row Name 10/18/19 1317             Exercise Goals   Increase Physical Activity  Yes       Intervention  Provide advice, education, support and counseling about physical activity/exercise needs.;Develop an individualized exercise prescription for aerobic and resistive training based on initial evaluation findings, risk stratification, comorbidities and participant's personal goals.       Expected Outcomes  Short Term: Attend rehab on a regular basis to increase amount of physical activity.;Long Term: Add in home exercise to make exercise part of routine and to increase amount of physical activity.;Long Term: Exercising regularly at least 3-5 days a week.       Increase Strength and Stamina  Yes       Intervention  Provide advice, education, support and counseling about physical activity/exercise needs.;Develop an individualized exercise prescription for aerobic and resistive training based on initial evaluation findings, risk stratification, comorbidities and participant's personal goals.       Expected Outcomes  Short Term: Increase workloads from initial exercise prescription for resistance, speed, and METs.;Short Term: Perform resistance training exercises routinely during rehab and add in resistance training at home;Long Term: Improve cardiorespiratory fitness, muscular  endurance and strength as measured by increased METs and functional capacity (6MWT)       Able to understand and use rate of perceived exertion (RPE) scale  Yes       Intervention  Provide education and explanation on how to use RPE scale       Expected Outcomes  Short Term: Able to use RPE daily in rehab to express subjective intensity level;Long Term:  Able to use RPE to guide intensity level when exercising independently       Able to understand and use Dyspnea scale  Yes       Intervention  Provide education and explanation on how to use Dyspnea scale       Expected Outcomes  Short Term: Able to use Dyspnea scale daily in rehab to express  subjective sense of shortness of breath during exertion;Long Term: Able to use Dyspnea scale to guide intensity level when exercising independently       Knowledge and understanding of Target Heart Rate Range (THRR)  Yes       Intervention  Provide education and explanation of THRR including how the numbers were predicted and where they are located for reference       Expected Outcomes  Short Term: Able to state/look up THRR;Short Term: Able to use daily as guideline for intensity in rehab;Long Term: Able to use THRR to govern intensity when exercising independently       Able to check pulse independently  Yes       Intervention  Provide education and demonstration on how to check pulse in carotid and radial arteries.;Review the importance of being able to check your own pulse for safety during independent exercise       Expected Outcomes  Short Term: Able to explain why pulse checking is important during independent exercise;Long Term: Able to check pulse independently and accurately       Understanding of Exercise Prescription  Yes       Intervention  Provide education, explanation, and written materials on patient's individual exercise prescription       Expected Outcomes  Short Term: Able to explain program exercise prescription;Long Term: Able to explain  home exercise prescription to exercise independently          Exercise Goals Re-Evaluation :   Discharge Exercise Prescription (Final Exercise Prescription Changes): Exercise Prescription Changes - 10/18/19 1300      Response to Exercise   Blood Pressure (Admit)  112/60    Blood Pressure (Exercise)  146/64    Blood Pressure (Exit)  120/66    Heart Rate (Admit)  60 bpm    Heart Rate (Exercise)  91 bpm    Heart Rate (Exit)  67 bpm    Oxygen Saturation (Admit)  100 %    Oxygen Saturation (Exercise)  98 %    Rating of Perceived Exertion (Exercise)  7    Perceived Dyspnea (Exercise)  0    Symptoms  none       Nutrition:  Target Goals: Understanding of nutrition guidelines, daily intake of sodium <1569m, cholesterol <2027m calories 30% from fat and 7% or less from saturated fats, daily to have 5 or more servings of fruits and vegetables.  Biometrics: Pre Biometrics - 10/18/19 1318      Pre Biometrics   Height  5' 6" (1.676 m)    Weight  201 lb 14.4 oz (91.6 kg)    BMI (Calculated)  32.6        Nutrition Therapy Plan and Nutrition Goals:   Nutrition Assessments:   Nutrition Goals Re-Evaluation:   Nutrition Goals Discharge (Final Nutrition Goals Re-Evaluation):   Psychosocial: Target Goals: Acknowledge presence or absence of significant depression and/or stress, maximize coping skills, provide positive support system. Participant is able to verbalize types and ability to use techniques and skills needed for reducing stress and depression.   Initial Review & Psychosocial Screening: Initial Psych Review & Screening - 10/17/19 1311      Initial Review   Current issues with  Current Sleep Concerns      Family Dynamics   Good Support System?  Yes    Comments  His incision is still tender which is making it hard to sleep. He can look to his wife for support.      Barriers  Psychosocial barriers to participate in program  There are no identifiable barriers or  psychosocial needs.      Screening Interventions   Interventions  Provide feedback about the scores to participant;Encouraged to exercise;Program counselor consult;To provide support and resources with identified psychosocial needs    Expected Outcomes  Short Term goal: Utilizing psychosocial counselor, staff and physician to assist with identification of specific Stressors or current issues interfering with healing process. Setting desired goal for each stressor or current issue identified.;Long Term Goal: Stressors or current issues are controlled or eliminated.;Short Term goal: Identification and review with participant of any Quality of Life or Depression concerns found by scoring the questionnaire.;Long Term goal: The participant improves quality of Life and PHQ9 Scores as seen by post scores and/or verbalization of changes       Quality of Life Scores:  Quality of Life - 10/18/19 1320      Quality of Life   Select  Quality of Life      Quality of Life Scores   Health/Function Pre  28.8 %    Socioeconomic Pre  29.14 %    Psych/Spiritual Post  30 %    Family Pre  30 %      Scores of 19 and below usually indicate a poorer quality of life in these areas.  A difference of  2-3 points is a clinically meaningful difference.  A difference of 2-3 points in the total score of the Quality of Life Index has been associated with significant improvement in overall quality of life, self-image, physical symptoms, and general health in studies assessing change in quality of life.  PHQ-9: Recent Review Flowsheet Data    Depression screen [facility] 2/9 10/18/2019   Decreased Interest 1   Down, Depressed, Hopeless 1   PHQ - 2 Score 2   Altered sleeping 1   Tired, decreased energy 1   Change in appetite 1   Feeling bad or failure about yourself  0   Trouble concentrating 1   Moving slowly or fidgety/restless 1   Suicidal thoughts 0   PHQ-9 Score 7   Difficult doing work/chores Somewhat difficult      Interpretation of Total Score  Total Score Depression Severity:  1-4 = Minimal depression, 5-9 = Mild depression, 10-14 = Moderate depression, 15-19 = Moderately severe depression, 20-27 = Severe depression   Psychosocial Evaluation and Intervention: Psychosocial Evaluation - 10/17/19 1312      Psychosocial Evaluation & Interventions   Interventions  Encouraged to exercise with the program and follow exercise prescription    Comments  His incision is still tender which is making it hard to sleep. He can look to his wife for support.    Expected Outcomes  Short: Attend [facility] stress management education to decrease stress. Long: Maintain exercise Post HeartTrack to keep stress at a minimum.    Continue Psychosocial Services   Follow up required by staff       Psychosocial Re-Evaluation:   Psychosocial Discharge (Final Psychosocial Re-Evaluation):   Vocational Rehabilitation: Provide vocational rehab assistance to qualifying candidates.   Vocational Rehab Evaluation & Intervention:   Education: Education Goals: Education classes will be provided on a variety of topics geared toward better understanding of heart health and risk factor modification. Participant will state understanding/return demonstration of topics presented as noted by education test scores.  Learning Barriers/Preferences: Learning Barriers/Preferences - 10/17/19 1313      Learning Barriers/Preferences   Learning Barriers  None    Learning  Preferences  None       Education Topics:  AED/CPR: - Group verbal and written instruction with the use of models to demonstrate the basic use of the AED with the basic ABC's of resuscitation.   General Nutrition Guidelines/Fats and Fiber: -Group instruction provided by verbal, written material, models and posters to present the general guidelines for heart healthy nutrition. Gives an explanation and review of dietary fats and fiber.   Controlling  Sodium/Reading Food Labels: -Group verbal and written material supporting the discussion of sodium use in heart healthy nutrition. Review and explanation with models, verbal and written materials for utilization of the food label.   Exercise Physiology & General Exercise Guidelines: - Group verbal and written instruction with models to review the exercise physiology of the cardiovascular system and associated critical values. Provides general exercise guidelines with specific guidelines to those with heart or lung disease.    Aerobic Exercise & Resistance Training: - Gives group verbal and written instruction on the various components of exercise. Focuses on aerobic and resistive training programs and the benefits of this training and how to safely progress through these programs..   Flexibility, Balance, Mind/Body Relaxation: Provides group verbal/written instruction on the benefits of flexibility and balance training, including mind/body exercise modes such as yoga, pilates and tai chi.  Demonstration and skill practice provided.   Stress and Anxiety: - Provides group verbal and written instruction about the health risks of elevated stress and causes of high stress.  Discuss the correlation between heart/lung disease and anxiety and treatment options. Review healthy ways to manage with stress and anxiety.   Depression: - Provides group verbal and written instruction on the correlation between heart/lung disease and depressed mood, treatment options, and the stigmas associated with seeking treatment.   Anatomy & Physiology of the Heart: - Group verbal and written instruction and models provide basic cardiac anatomy and physiology, with the coronary electrical and arterial systems. Review of Valvular disease and Heart Failure   Cardiac Procedures: - Group verbal and written instruction to review commonly prescribed medications for heart disease. Reviews the medication, class of the drug,  and side effects. Includes the steps to properly store meds and maintain the prescription regimen. (beta blockers and nitrates)   Cardiac Medications I: - Group verbal and written instruction to review commonly prescribed medications for heart disease. Reviews the medication, class of the drug, and side effects. Includes the steps to properly store meds and maintain the prescription regimen.   Cardiac Medications II: -Group verbal and written instruction to review commonly prescribed medications for heart disease. Reviews the medication, class of the drug, and side effects. (all other drug classes)    Go Sex-Intimacy & Heart Disease, Get SMART - Goal Setting: - Group verbal and written instruction through game format to discuss heart disease and the return to sexual intimacy. Provides group verbal and written material to discuss and apply goal setting through the application of the S.M.A.R.T. Method.   Other Matters of the Heart: - Provides group verbal, written materials and models to describe Stable Angina and Peripheral Artery. Includes description of the disease process and treatment options available to the cardiac patient.   Exercise & Equipment Safety: - Individual verbal instruction and demonstration of equipment use and safety with use of the equipment.   Cardiac Rehab from 10/18/2019 in [facility] Cardiac and Pulmonary Rehab  Date  10/18/19  Educator  AS  Instruction Review Code  1- Verbalizes Understanding  Infection Prevention: - Provides verbal and written material to individual with discussion of infection control including proper hand washing and proper equipment cleaning during exercise session.   Cardiac Rehab from 10/18/2019 in [facility] Cardiac and Pulmonary Rehab  Date  10/18/19  Educator  AS  Instruction Review Code  1- Verbalizes Understanding      Falls Prevention: - Provides verbal and written material to individual with discussion of falls prevention and  safety.   Cardiac Rehab from 10/18/2019 in [facility] Cardiac and Pulmonary Rehab  Date  10/18/19  Educator  AS  Instruction Review Code  1- Verbalizes Understanding      Diabetes: - Individual verbal and written instruction to review signs/symptoms of diabetes, desired ranges of glucose level fasting, after meals and with exercise. Acknowledge that pre and post exercise glucose checks will be done for 3 sessions at entry of program.   Cardiac Rehab from 10/18/2019 in [facility] Cardiac and Pulmonary Rehab  Date  10/18/19  Educator  AS  Instruction Review Code  1- Verbalizes Understanding      Know Your Numbers and Risk Factors: -Group verbal and written instruction about important numbers in your health.  Discussion of what are risk factors and how they play a role in the disease process.  Review of Cholesterol, Blood Pressure, Diabetes, and BMI and the role they play in your overall health.   Sleep Hygiene: -Provides group verbal and written instruction about how sleep can affect your health.  Define sleep hygiene, discuss sleep cycles and impact of sleep habits. Review good sleep hygiene tips.    Other: -Provides group and verbal instruction on various topics (see comments)   Knowledge Questionnaire Score: Knowledge Questionnaire Score - 10/18/19 1320      Knowledge Questionnaire Score   Pre Score  24/26       Core Components/Risk Factors/Patient Goals at Admission: Personal Goals and Risk Factors at Admission - 10/18/19 1319      Core Components/Risk Factors/Patient Goals on Admission    Weight Management  Yes;Weight Maintenance;Weight Loss    Admit Weight  201 lb 14.4 oz (91.6 kg)    Goal Weight: Short Term  190 lb (86.2 kg)    Goal Weight: Long Term  180 lb (81.6 kg)    Expected Outcomes  Short Term: Continue to assess and modify interventions until short term weight is achieved;Long Term: Adherence to nutrition and physical activity/exercise program aimed toward attainment of  established weight goal;Weight Loss: Understanding of general recommendations for a balanced deficit meal plan, which promotes 1-2 lb weight loss per week and includes a negative energy balance of [phone] kcal/d;Understanding recommendations for meals to include 15-35% energy as protein, 25-35% energy from fat, 35-60% energy from carbohydrates, less than 262m of dietary cholesterol, 20-35 gm of total fiber daily;Understanding of distribution of calorie intake throughout the day with the consumption of 4-5 meals/snacks    Diabetes  Yes    Intervention  Provide education about signs/symptoms and action to take for hypo/hyperglycemia.;Provide education about proper nutrition, including hydration, and aerobic/resistive exercise prescription along with prescribed medications to achieve blood glucose in normal ranges: Fasting glucose 65-99 mg/dL    Expected Outcomes  Short Term: Participant verbalizes understanding of the signs/symptoms and immediate care of hyper/hypoglycemia, proper foot care and importance of medication, aerobic/resistive exercise and nutrition plan for blood glucose control.;Long Term: Attainment of HbA1C < 7%.       Core Components/Risk Factors/Patient Goals Review:    Core Components/Risk Factors/Patient Goals at  Discharge (Final Review):    ITP Comments: ITP Comments    Row Name 10/17/19 1310           ITP Comments  Virtual Orientation performed. Patient informed when to come in for RD and EP orientation. Diagnosis can be found in [facility] 09/17/2019          Comments: initial ITP

## 2019-10-23 ENCOUNTER — Telehealth: Payer: Self-pay

## 2019-10-23 NOTE — Telephone Encounter (Signed)
-----   Message from Lennon Alstrom, PA-C sent at 10/19/2019 10:27 AM EST ----- Good news!! Please let Brian Chapman know that his repeat potassium is within range and 4.3 (goal is 4.0) and much improved from 5.7. I suspect that his previous blood sample showed high potassium due to pseudohyperkalemia or factitious hyperkalemia, which occurs due to lab error and hemolysis of the blood sample, which makes one's potassium appear to be higher than it actually is. No further labs are needed at this time!

## 2019-10-23 NOTE — Telephone Encounter (Signed)
Spoke with patient regarding results.  Patient verbalizes understanding. Advised patient to call back with any issues or concerns.  

## 2019-10-24 ENCOUNTER — Encounter: Payer: No Typology Code available for payment source | Admitting: *Deleted

## 2019-10-24 ENCOUNTER — Other Ambulatory Visit: Payer: Self-pay

## 2019-10-24 DIAGNOSIS — Z951 Presence of aortocoronary bypass graft: Secondary | ICD-10-CM | POA: Diagnosis not present

## 2019-10-24 LAB — GLUCOSE, CAPILLARY
Glucose-Capillary: 179 mg/dL — ABNORMAL HIGH (ref 70–99)
Glucose-Capillary: 146 mg/dL — ABNORMAL HIGH (ref 70–99)

## 2019-10-24 NOTE — Progress Notes (Signed)
Daily Session Note  Patient Details  Name: Brian Chapman MRN: [id number] Date of Birth: [birth year] Referring Provider:     Cardiac Rehab from 10/18/2019 in [facility]  Referring Provider  [name]      Encounter Date: 10/24/2019  Check In: Session Check In - 10/24/19 0830      Check-In   Supervising physician immediately available to respond to emergencies  See telemetry face sheet for immediately available ER MD    Location  [facility]-Cardiac & Pulmonary Rehab    Staff Present  Heath Lark, RN, BSN, CCRP;Joseph Hood RCP,RRT,BSRT;Amanda [place], [place], ACSM CEP, Exercise Physiologist    Virtual Visit  No    Medication changes reported      No    Fall or balance concerns reported     No    Warm-up and Cool-down  Performed on first and last piece of equipment    Resistance Training Performed  Yes    VAD Patient?  No    PAD/SET Patient?  No      Pain Assessment   Currently in Pain?  No/denies          Social History   Tobacco Use  Smoking Status Former Smoker  . Packs/day: 0.25  . Years: 5.00  . Pack years: 1.25  . Types: Cigarettes  . Quit date: 64  . Years since quitting: 26.2  Smokeless Tobacco Never Used    Goals Met:  Exercise tolerated well Personal goals reviewed No report of cardiac concerns or symptoms  Goals Unmet:  Not Applicable  Comments: First full day of exercise!  Patient was oriented to gym and equipment including functions, settings, policies, and procedures.  Patient's individual exercise prescription and treatment plan were reviewed.  All starting workloads were established based on the results of the 6 minute walk test done at initial orientation visit.  The plan for exercise progression was also introduced and progression will be customized based on patient's performance and goals.    Dr. Emily Filbert is Medical Director for [place] and [name].

## 2019-10-25 DIAGNOSIS — G4733 Obstructive sleep apnea (adult) (pediatric): Secondary | ICD-10-CM | POA: Diagnosis not present

## 2019-10-25 NOTE — Progress Notes (Signed)
Office Visit    Patient Name: Brian Chapman Date of Encounter: 10/30/2019  Primary Care Provider:  [facility] Primary Cardiologist:  Lorine Bears, MD  Chief Complaint    Chief Complaint  Patient presents with  . office visit    2 week F/U; Meds verbally reviewed with patient.    74 year old male with history of history of CAD, STEMI 09/16/19, CABG x4 09/17/19(LIMA-LAD, SVG-diagonal, SVG-OM1 to OM2), possible post CABG AF, DM2, hypertension, hyperlipidemia, former smoker, obesity, and seen today for 2 week follow-up s/p 09/16/19 CABG.   Past Medical History    Past Medical History:  Diagnosis Date  . CAD (coronary artery disease)    severe distal left main stenosis with plaque rupture, extending into the ostial LAD and occluded distal LAD, likely from embolization.  He had mild to moderate pRCA stenosis. He underwent POBA of distal LAD  . Diabetes mellitus without complication (HCC)   . HFrEF (heart failure with reduced ejection fraction) (HCC)    2/6 TTE 45-50%, severe LVH, G1DD  . History of tobacco use   . Hyperlipidemia   . Hypertension   . OSA on CPAP    wears CPAP qHS per pt  . PAC (premature atrial contraction)    PAC verus post cabg AF on amiodarone s/p cath. Not on OAC.  . S/P CABG x 4    2/7 LIMA-LAD, SVG-diagonal, SVG-OM1 to OM2   Past Surgical History:  Procedure Laterality Date  . CARDIAC CATHETERIZATION    . CORONARY ARTERY BYPASS GRAFT N/A 09/17/2019   Procedure: CORONARY ARTERY BYPASS GRAFTING (CABG), ON PUMP, TIMES FOUR, USING LEFT INTERNAL MAMMARY ARTERY AND RIGHT GREATER SAPHENOUS VEIN HARVESTED ENDOSCOPICALLY;  Surgeon: Linden Dolin, MD;  Location: [facility] OR;  Service: Open Heart Surgery;  Laterality: N/A;  . CORONARY/GRAFT ACUTE MI REVASCULARIZATION N/A 09/16/2019   Procedure: Coronary/Graft Acute MI Revascularization;  Surgeon: Iran Ouch, MD;  Location: [facility] INVASIVE CV LAB;  Service: Cardiovascular;  Laterality: N/A;  . LEFT  HEART CATH AND CORONARY ANGIOGRAPHY N/A 09/16/2019   Procedure: LEFT HEART CATH AND CORONARY ANGIOGRAPHY;  Surgeon: Iran Ouch, MD;  Location: [facility] INVASIVE CV LAB;  Service: Cardiovascular;  Laterality: N/A;  . TEE WITHOUT CARDIOVERSION N/A 09/17/2019   Procedure: TRANSESOPHAGEAL ECHOCARDIOGRAM (TEE);  Surgeon: Linden Dolin, MD;  Location: [facility] OR;  Service: Open Heart Surgery;  Laterality: N/A;  . TONSILLECTOMY    . VASECTOMY      Allergies  Allergies  Allergen Reactions  . Lisinopril Cough    History of Present Illness    Brian Chapman is a 74 y.o. male with PMH as above and who reportedly mostly follows at the [place].  He was recently admitted to [facility] with [facility] cardiology consulted and need for CABG x4.  At that time, he noted previous cardiac catheterization and mild CAD; however, details unknown and records not available per EMR.  He was working as a Production designer, theatre/television/film at [employer].  It was also noted at that time that the patient had a recent COVID-19 infection in November and was sick for approximately 10 days but did not require hospitalization.  He had no residual symptoms.  On the day of his presentation, he was on his way to work when he started to have severe substernal chest tightness and heaviness with associated shortness of breath.  Onset was 1 hour before his presentation.  In the ED, he was given sublingual nitro and unfractioned heparin with  some improvement in his chest pain.  Initial EKG showed possible hyperacute T waves in the anterior leads, confirmed a subsequent EKG with 1 to 2 mm of ST elevation starting from V3 to V5.  He was also noted to have inferior ST elevation on telemetry.  Given his symptoms and EKG findings, emergent cardiac catheterization was recommended with possible PCI.  Cardiac catheterization was performed 09/16/19 (same day as presentation), and he was found to have severe distal left main stenosis with plaque rupture, extending into the ostial LAD and  occluded distal LAD, likely from embolization.  He had mild to moderate pRCA stenosis. He underwent POBA of distal LAD. TTE showed EF 45 to 50% and with further details as outlined / copied and pasted in studies below. He then was transferred to [facility] for CABG x4 as copied and pasted below in detail. CABG x4 by Dr. Renaldo Fiddler took place on 09/17/19 (LIMA-LAD, SVG-diagonal, SVG-OM1 to OM2).  Right sided EVH was from the right thigh and lower leg. Operative findings were of preserved LVSF and diffuse disease consistent with underlying DM2. Rigid sternal reconstruction was performed with linear plating system. He was continued on ASA, atorvastatin, metoprolol. AF was noted on telemetry during the admission, though at discharge, no clear AF was noted and instead noted were PACs. At follow-up on 2/22 with CTS / Dr. [name], it was noted that he was holding some of his BP medications after discharge, which lead to his BP elevated at RTC. He felt weak with poor appetite. BP 157/90 with HR 94.  When last seen in clinic, he reported that he was doing well this somewhat fatigued and working to get his energy back.  He was closely monitoring his blood sugar and BP with SBP 102-132 and DBP 68-87.  He reported ongoing insomnia, which she felt was contributing to his weakness and fatigue. He reported right sided neck pain today  where he received his central line (refer to note for right IJ Swan - Ganz catheter) and admitted this may be worsened by shaving the scab with his razor each AM. He was scheduled for cardiac rehab. BP elevated and sub-optimal with patient reporting it well controlled at home. He noteed melena with CBC unremarkable. He also reported diarrhea often, as well as stools that are not fully formed, and with BMET unremarkable.  He had not yet returned to work and was concerned regarding his ongoing fatigue, as he used to be able to walk for long distances.  Today, he returns to clinic and reports  that he feels very well and is doing well from a cardiac standpoint.  He denies any recent chest pain or need for sublingual nitroglycerin.  He has started cardiac rehab and is doing well.  He also started back at work with a reduced shift /half day work schedule of 6 hours per day and is tolerating this well.  He notes increasing energy and strength with dramatic change and sx improvement noted from his previous visit. He reports that his sternal incision continuse to heal well with only mild and improving soreness and denies any signs or symptoms of infection. He reports improved right-sided neck pain and that his scab remains intact.  He continues to be careful while shaving, however, and to avoid nicking the scab. His previously reported insomnia has improved since his last visit and he is sleeping well.  He states he used OTC sleep aids a couple of times after his most recent visit  and has not needed assistance with sleep lately. We reviewed that, if ongoing sleep issues, he can let us know, as well as if any questions regarding OTC medications and their ingredients / included medications. Reviewed to ensure that OTC mediations do not have ASA or medications that may increase his blood pressure with patient understanding. No further dizziness with position changes as previously reported. No presyncope or syncope.  He has not had any further melena or diarrhea. No other reported signs or symptoms consistent with bleeding.  He denies any further DOE with minimal activity. No reported SOB.  No orthopnea, PND, abdominal distention, or early satiety.  He does not feel he is holding on to fluid but agreeable to daily weights and BP monitoring at home.   We briefly discussed his doumented lisinopril/ACE intolerance today with confirmation of reported slight cough on this medication.  We have agreed to defer trial of ARB or escalation of BB / any medication changes until RTC and per patient preference. His BP today  is relatively well controlled and he is euvolemic on exam despite increased weight and BP from previous visit. Consider previous lower BP and weight 2/2 ongoing diarrhea and given his orthostatic sx. Reassess at BP and volume status RTC. He will continue to monitor fluids and salt and will call the office if s/sx of volume overload or persistently elevated pressures. He reported that cardiothoracic surgery recently discontinued two of his medications but is uncertain of the names and will call the office once home to clarify this change. He understands that, dependent on these med changes, we may need to change his medications before RTC.   Home Medications    Prior to Admission medications   Medication Sig Start Date End Date Taking? Authorizing Provider  allopurinol (ZYLOPRIM) 300 MG tablet Take 300 mg by mouth daily.    [provider]  amiodarone (PACERONE) 200 MG tablet Take 1 tablet (200 mg total) by mouth 2 (two) times daily. 09/21/19   Leary Roca, PA-C  aspirin EC 81 MG tablet Take 81 mg by mouth daily.    [provider]  atorvastatin (LIPITOR) 80 MG tablet Take 80 mg by mouth daily.    [provider]  betamethasone valerate lotion (VALISONE) 0.1 % Apply 1 application topically daily as needed for irritation.    [provider]  clopidogrel (PLAVIX) 75 MG tablet Take 1 tablet (75 mg total) by mouth daily. 09/22/19   Leary Roca, PA-C  colchicine 0.6 MG tablet Take 1 tablet (0.6 mg total) by mouth daily. 09/22/19   Leary Roca, PA-C  cyclobenzaprine (FLEXERIL) 5 MG tablet Take 1 tablet (5 mg total) by mouth 3 (three) times daily as needed for muscle spasms. 06/30/19   Joni Reining, PA-C  desonide (DESOWEN) 0.05 % cream Apply 1 application topically 2 (two) times daily.    [provider]  diclofenac Sodium (VOLTAREN) 1 % GEL Apply 4 g topically 4 (four) times daily.    [provider]  glucose blood test strip  Test blood ac and hs...the patient has been instructed. 10/02/19   Linden Dolin, MD  indomethacin (INDOCIN) 50 MG capsule Take 50 mg by mouth daily as needed. Gout pain    [provider]  ketoconazole (NIZORAL) 2 % shampoo Apply 1 application topically 2 (two) times a week.    [provider]  Lancets [facility] ULTRASOFT) lancets Use as instructed...test ac and hs. 10/02/19   Valentina Lucks  Z, MD  metFORMIN (GLUCOPHAGE) 500 MG tablet Take 500 mg by mouth daily with breakfast.    [provider]  metoprolol tartrate (LOPRESSOR) 25 MG tablet Take 12.5 mg by mouth 2 (two) times daily.    [provider]  nitroGLYCERIN (NITROSTAT) 0.4 MG SL tablet Place 1 tablet (0.4 mg total) under the tongue every 5 (five) minutes as needed for chest pain. 10/13/19 01/11/20  Marisue Ivan D, PA-C    Review of Systems    He denies chest pain, palpitations, dyspnea, pnd, orthopnea, n, v, dizziness, syncope, edema, weight gain, or early satiety.  He reports increasing energy.   All other systems reviewed and are otherwise negative except as noted above.  Physical Exam    VS:  BP 130/76 (BP Location: Left Arm, Patient Position: Sitting, Cuff Size: Normal)   Pulse 64   Ht 5' 6.5" (1.689 m)   Wt 206 lb 8 oz (93.7 kg)   SpO2 97%   BMI 32.83 kg/m  , BMI Body mass index is 32.83 kg/m. GEN: Well nourished, well developed, in no acute distress. HEENT: normal. Neck: Supple, no JVD, carotid bruits, or masses. Cardiac: RRR with intermittent extrasystole, no murmurs. No rubs or gallops. Right sided neck central line scab healing and without signs of infection and will continue to monitor. Median sternotomy site healing well without signs of infection. R EVH incision sites healing well. No clubbing, cyanosis, or significant edema.  Radials/DP/PT 2+ and equal bilaterally.  Respiratory:  Respirations regular and unlabored, clear to auscultation bilaterally. GI: Soft, nontender,  nondistended, BS + x 4. MS: no deformity or atrophy. Skin: warm and dry, no rash. Neuro:  Strength and sensation are intact. Psych: Normal affect.  Accessory Clinical Findings    ECG personally reviewed by me today - SR, 1st degree AVB, 64bpm, previous anterolateral ischemia, Q waves persist in inferior leads - no acute changes.  VITALS Reviewed today   Temp Readings from Last 3 Encounters:  10/16/19 (!) 97.3 F (36.3 C) (Temporal)  10/02/19 (!) 97.5 F (36.4 C) (Temporal)  09/21/19 98.5 F (36.9 C) (Oral)   BP Readings from Last 3 Encounters:  10/30/19 130/76  10/16/19 (!) 113/55  10/13/19 (!) 144/90   Pulse Readings from Last 3 Encounters:  10/30/19 64  10/16/19 60  10/13/19 67    Wt Readings from Last 3 Encounters:  10/30/19 206 lb 8 oz (93.7 kg)  10/18/19 201 lb 14.4 oz (91.6 kg)  10/16/19 204 lb (92.5 kg)     LABS  reviewed today   CareEverwhere Labs present? Yes/No: No no recent   Lab Results  Component Value Date   WBC 7.6 10/13/2019   HGB 13.7 10/13/2019   HCT 42.9 10/13/2019   MCV 87 10/13/2019   PLT 337 10/13/2019   Lab Results  Component Value Date   CREATININE 1.27 (H) 10/16/2019   BUN 17 10/16/2019   NA 136 10/16/2019   K 4.3 10/16/2019   CL 104 10/16/2019   CO2 27 10/16/2019   No results found for: ALT, AST, GGT, ALKPHOS, BILITOT No results found for: CHOL, HDL, LDLCALC, LDLDIRECT, TRIG, CHOLHDL  Lab Results  Component Value Date   HGBA1C 6.6 (H) 09/21/2019   No results found for: TSH   STUDIES/PROCEDURES reviewed today  09/17/19 CABGx4  Coronary Artery Bypass Grafting x4 Left Internal Mammary Artery to Distal Left Anterior Descending Coronary Artery;Saphenous Vein Graft torightPosterolateralCoronary Artery and 1stObtuse Marginal Branch of Left Circumflex Coronary Arteryas a sequenced graft;Sapheonous  Vein Graft to1stDiagonal Branch Coronary Artery;Endoscopic Vein Harvest fromrightThigh and Lower Leg;  Rigid  sternal reconstruction with linear plating system Operative Findings: ? preservedleft ventricular systolic function ? goodquality left internal mammary artery conduit ? Goodquality saphenous vein conduit ? fairquality target vessels for grafting due to diffuse disease consistent with underlying diabetes  The1stdiagonal branch of the left anterior descending coronary artery was grafted using a reversed saphenous vein graft in an end-to-side fashion. At the site of distal anastomosis the target vessel was fairquality and measured approximately 1.55mm in diameter.  Therightposterolateralbranch of the right coronary artery was grafted using a reversed saphenous vein graft in an end-to-side fashion. At the site of distal anastomosis the target vessel was fairquality and measured approximately 1.74mm in diameter. Next, the1stobtuse marginal branch of the left circumflex coronary artery was grafted in an side-to-side fashionto create a sequenced graft configuration with the Right PLA. At the site of distal anastomosis the target vessel was fairquality and measured approximately 1.40mm in diameter..  The distal left anterior coronary artery was grafted with the left internal mammary artery in an end-to-side fashion. At the site of distal anastomosis the target vessel was fairquality and measured approximately1.70mm in diameter. All proximal vein graft anastomoses were placed directly to the ascending aorta prior to removal of the aortic cross clamp. The septal myocardial temperature rose rapidly after reperfusion of the left internal mammary artery graft. Deairing procedures were performed and the aortic cross clamp was removed  2/7 Intraoperative TEE PRE-OP FINDINGS  Left Ventricle: The left ventricle has mild-moderately reduced systolic  function, with an ejection fraction of 40-45%.  Right Ventricle: The right ventricle has normal systolic function.  Left Atrium: The left  atrial appendage is well visualized and there is  evidence of thrombus present. The left atrial appendage is well visualized  and there is no evidence of thrombus present.  Right Atrium: Right atrial pressure is estimated at 10 mmHg.  Mitral Valve: The mitral valve is normal in structure. Mitral valve  regurgitation is trivial by color flow Doppler.  Tricuspid Valve: The tricuspid valve was normal in structure. Tricuspid  valve regurgitation is trivial by color flow Doppler.  Belinda Block MD  Electronically signed by Belinda Block MD  Signature Date/Time: 09/21/2019  2/7 US Doppler pre CABG Carotid and [place] Summary:  Right Carotid: The extracranial vessels were near-normal with only minimal  wall  thickening or plaque.  Left Carotid: The extracranial vessels were near-normal with only minimal  wall thickening or plaque.  Vertebrals: Bilateral vertebral arteries demonstrate antegrade flow.  Subclavians: Not assessed.  Right Upper Extremity: Doppler waveforms remain within normal limits with  right radial compression. Doppler waveforms decrease <50% with right ulnar  compression.  Left Upper Extremity: Doppler waveforms remain within normal limits with  left radial compression. Doppler waveforms remain within normal limits  with left ulnar compression.   Cath 09/16/19: 1. Severe distal left main stenosis with plaque rupture extending into the ostial LAD with occluded distal LAD likely due to embolization. Mild to moderate proximal RCA stenosis. Surgical targets include mid LAD, first diagonal and OM 3. 2. Normal LV systolic function and moderately elevated left ventricular end-diastolic pressure. 3. Successful balloon angioplasty to the distal LAD to establish TIMI-3 flow. Cardiac catheterization was performed via the right radial artery. 4. Successful intra-aortic balloon pump placement via the right common femoral artery. Recommendations: Given this the left main  involvement, diffuse LAD disease and diabetic status, I think surgical revascularization with urgent/emergent CABG  is indicated. The patient did not receive any oral antiplatelet medication other than aspirin. Continue unfractionated heparin. I discussed the case with Dr. [name].  TTE 09/16/19: 1. Left ventricular ejection fraction, by visual estimation, is 45 to  50%. The left ventricle has hyperdynamic function. There is severely  increased left ventricular hypertrophy.  2. Definity contrast agent was given IV to delineate the left ventricular  endocardial borders.  3. The left ventricle demonstrates regional wall motion abnormalities.  4. Severe akinesis of the left ventricular, apical apical segment and  inferoapical and distal anteroseptal/anteroapical walls. No apical  thrombus.  5. Left ventricular diastolic parameters are consistent with Grade I  diastolic dysfunction (impaired relaxation).  6. Global right ventricle has mildly reduced systolic function.The right  ventricular size is normal. No increase in right ventricular wall  thickness.  7. Hypokinetic right ventricular apex.  8. Left atrial size was normal.  9. Right atrial size was normal.  10. Mild mitral annular calcification.  11. The mitral valve is grossly normal. Trivial mitral valve  regurgitation.  12. The tricuspid valve is grossly normal.  13. The tricuspid valve is grossly normal. Tricuspid valve regurgitation  is trivial.  14. The aortic valve is tricuspid. Aortic valve regurgitation is not  visualized. Mild aortic valve sclerosis without stenosis.  15. The pulmonic valve was grossly normal. Pulmonic valve regurgitation is  not visualized.  16. The interatrial septum was not well visualized.   Assessment & Plan    CAD (LIMA-LAD, SVG-diagonal, SVG-OM1 to OM2) S/p 2/6 STEMI S/p 2/7 CABG x4  --No CP. He has not needed his PRN SL nitro. He has started cardiac rehab and is now sleeping well with  improved energy. His incision sites are healing well. He reports successful restart back at work (6h days). Continue DAPT with ASA and Plavix. No s/sx consistent with bleeding. Escalation of BB precluded by current HR per patient preference. Revisit increase of BB dose +/- addition of ARB with consideration of ACE cough as discussed with patient today. Previous echo and intraoperative TEE above discussed with EF reduced 45-50%. Also noted is RAP of , though euvolemic today. Given orthostasis at previous visit, will reassess at RTC with close home monitoring of BP/wt.  Plan: Continue DAPT, current dose BB, and statin. Revisit GDMT at RTC with possible increased BB if room in HR and +/- ARB with consideration of ACE cough and home BP. Recheck LDL/LFTs at RTC and addition of Zetia or referral to lipid clinic if LDL not at goal. Follow-up with CTS as scheduled and clarify if discontinue medications and which ones with further recommendations if needed at that time. Will plan to update echo in 2-3 months to reassess EF. Continue cardiac rehab and strict risk factor modification / lifestyle changes.   HFrEF (EF 45-50%) --No SOB at rest and euvolemic on exam despite increase in wt and BP from 3/8 visit (with consideration of persistent diarrhea and orthostasis at last visit). TTE/TEE as above with EF 45-50% and plan to repeat in 2-3 months as outlined above. He is not on a standing diuretic with no indication for start of diuretic today but recommend reassess at RTC. He will continue to monitor home weights and BP and call if any sx of volume overload as reviewed today or significant increase in wt / BP. Per GDMT with comorbid HTN/DM2, but with consideration of h/o ACE cough, reassess at RTC possible  addition of ARB if tolerated. Will defer escalation of BB given HR  64bpm and previous orthostasis. He is agreeable to bring in a BP log at RTC.     HTN --BP 130/76 and borderline with goal BP under 130/80. He  will monitor home BP readings, as he believes he has softer pressures at home. With consistently elevated BP or s/sx of volume overload, he will call the clinic. Medication recommendations as above with recommendation for continued titration of medication for optimal BP control as tolerated. He will call us with CTS medication changes, and we will plan to reach out to CTS if needed as well.   HLD --Continue statin. Recheck of lipids / LFTs at RTC and addition of Zetia or referral to lipid clinic if not at LDL goal <70.   ?PAF versus PACs --NSR today. No extrasystole appreciated on exam. No racing HR or palpitations. Denies any presyncope or syncope. Continue amiodarone (pending patient call with clarification of CTS discontinued medications). No indication for Zio at this time.   Insomnia --Resolved. He recently used OTC sleep aids with recommendation to check the labels and feel free to call us if any questions or if to verify no additional ASA or medications that will increase BP.   Diarrhea, Melena --Resolved. Repeat BMET and CBC as above.  R sided neck discomfort --Improving. Ongoing care with shaving recommended. Will continue to monitor.   DM2  --Most recent A1C as above. Continue metformin. Monitoring per PCP. As above, he has a listed intolerance to ACE/ lisinopril but with continued elevated pressures, consider trial of ARB at RTC per GDMT if agreeable to trial.   Medication changes: Pending phone call / message with patient report of medications discontinued by CTS. Reach out to patient / CTS if do not hear by end of week. Reassess start of diuretic at RTC given RAP as above.  Labs ordered: None Studies / Imaging ordered: None Future considerations: Repeat echo in 3-6 months. Repeat lipid/liver function at RTC. Disposition: RTC 1 month.  Total time spent with patient today 30 minutes. This includes reviewing records, evaluating the patient, and coordinating care. Face-to-face  time >50%.    Lennon Alstrom, PA-C 10/30/2019

## 2019-10-26 ENCOUNTER — Encounter: Payer: No Typology Code available for payment source | Admitting: *Deleted

## 2019-10-26 ENCOUNTER — Other Ambulatory Visit: Payer: Self-pay

## 2019-10-26 DIAGNOSIS — Z951 Presence of aortocoronary bypass graft: Secondary | ICD-10-CM

## 2019-10-26 LAB — GLUCOSE, CAPILLARY
Glucose-Capillary: 131 mg/dL — ABNORMAL HIGH (ref 70–99)
Glucose-Capillary: 105 mg/dL — ABNORMAL HIGH (ref 70–99)

## 2019-10-26 NOTE — Progress Notes (Signed)
Daily Session Note  Patient Details  Name: Brian Chapman MRN: [id number] Date of Birth: May 19, 1946 Referring Provider:     Cardiac Rehab from 10/18/2019 in [facility] Cardiac [facility] Pul[facility] Re[facility]  Referring Provider  [name]      Encounter Date: 10/26/2019  Check In: Session Check In - 10/26/19 0756      Check-In   Supervising physician immediately available to respond to emergencies  See telemetry face sheet for immediately available ER MD    Location  [facility]-Cardiac & Pulmonary Rehab    Staff Present  Heath Lark, RN, BSN, CCRP;Jessica [place], MA, RCEP, CCRP, CCET;Laureen Downing, BS, RRT, CPFT;Amanda Sunbright, [place], ACSM CEP, Exercise Physiologist    Virtual Visit  No    Medication changes reported      No    Fall or balance concerns reported     No    Warm-up and Cool-down  Performed on first and last piece of equipment    Resistance Training Performed  Yes    VAD Patient?  No    PAD/SET Patient?  No      Pain Assessment   Currently in Pain?  No/denies          Social History   Tobacco Use  Smoking Status Former Smoker  . Packs/day: 0.25  . Years: 5.00  . Pack years: 1.25  . Types: Cigarettes  . Quit date: 3  . Years since quitting: 26.2  Smokeless Tobacco Never Used    Goals Met:  Proper associated with RPD/PD & O2 Sat Exercise tolerated well No report of cardiac concerns or symptoms  Goals Unmet:  Not Applicable  Comments: Pt able to follow exercise prescription today without complaint.  Will continue to monitor for progression. Requiring oxygen while on treadmill   Dr. Emily Filbert is Medical Director for [name].

## 2019-10-30 ENCOUNTER — Other Ambulatory Visit: Payer: Self-pay

## 2019-10-30 ENCOUNTER — Encounter: Payer: Self-pay | Admitting: Physician Assistant

## 2019-10-30 ENCOUNTER — Ambulatory Visit (INDEPENDENT_AMBULATORY_CARE_PROVIDER_SITE_OTHER): Payer: BC Managed Care – PPO | Admitting: Physician Assistant

## 2019-10-30 VITALS — BP 130/76 | HR 64 | Ht 66.5 in | Wt 206.5 lb

## 2019-10-30 DIAGNOSIS — I252 Old myocardial infarction: Secondary | ICD-10-CM | POA: Diagnosis not present

## 2019-10-30 DIAGNOSIS — E785 Hyperlipidemia, unspecified: Secondary | ICD-10-CM

## 2019-10-30 DIAGNOSIS — I10 Essential (primary) hypertension: Secondary | ICD-10-CM | POA: Diagnosis not present

## 2019-10-30 DIAGNOSIS — I480 Paroxysmal atrial fibrillation: Secondary | ICD-10-CM

## 2019-10-30 DIAGNOSIS — I5020 Unspecified systolic (congestive) heart failure: Secondary | ICD-10-CM

## 2019-10-30 DIAGNOSIS — Z79899 Other long term (current) drug therapy: Secondary | ICD-10-CM

## 2019-10-30 DIAGNOSIS — E119 Type 2 diabetes mellitus without complications: Secondary | ICD-10-CM

## 2019-10-30 DIAGNOSIS — I2510 Atherosclerotic heart disease of native coronary artery without angina pectoris: Secondary | ICD-10-CM | POA: Diagnosis not present

## 2019-10-30 DIAGNOSIS — Z951 Presence of aortocoronary bypass graft: Secondary | ICD-10-CM

## 2019-10-30 NOTE — Patient Instructions (Signed)
Medication Instructions:  - Your physician recommends that you continue on your current medications as directed. Please refer to the Current Medication list given to you today.  - Please call with the names of the 2 medications that Dr. [name] stopped  *If you need a refill on your cardiac medications before your next appointment, please call your pharmacy*   Lab Work: - none ordered  If you have labs (blood work) drawn today and your tests are completely normal, you will receive your results only by: Marland Kitchen MyChart Message (if you have MyChart) OR . A paper copy in the mail If you have any lab test that is abnormal or we need to change your treatment, we will call you to review the results.   Testing/Procedures: - none ordered   Follow-Up: At [facility], you and your health needs are our priority.  As part of our continuing mission to provide you with exceptional heart care, we have created designated Provider Care Teams.  These Care Teams include your primary Cardiologist (physician) and Advanced Practice Providers (APPs -  Physician Assistants and Nurse Practitioners) who all work together to provide you with the care you need, when you need it.  We recommend signing up for the patient portal called "MyChart".  Sign up information is provided on this After Visit Summary.  MyChart is used to connect with patients for Virtual Visits (Telemedicine).  Patients are able to view lab/test results, encounter notes, upcoming appointments, etc.  Non-urgent messages can be sent to your provider as well.   To learn more about what you can do with MyChart, go to [email or web address].    Your next appointment:   1 month(s)  The format for your next appointment:   In Person  Provider:    You may see Lorine Bears, MD or one of the following Advanced Practice Providers on your designated Care Team:    Nicolasa Ducking, NP  Eula Listen, PA-C  Marisue Ivan, PA-C    Other  Instructions - Please continue to monitor your blood pressure at home.    How to Take Your Blood Pressure You can take your blood pressure at home with a machine. You may need to check your blood pressure at home:  To check if you have high blood pressure (hypertension).  To check your blood pressure over time.  To make sure your blood pressure medicine is working. Supplies needed: You will need a blood pressure machine, or monitor. You can buy one at a drugstore or online. When choosing one:  Choose one with an arm cuff.  Choose one that wraps around your upper arm. Only one finger should fit between your arm and the cuff.  Do not choose one that measures your blood pressure from your wrist or finger. Your doctor can suggest a monitor. How to prepare Avoid these things for 30 minutes before checking your blood pressure:  Drinking caffeine.  Drinking alcohol.  Eating.  Smoking.  Exercising. Five minutes before checking your blood pressure:  Pee.  Sit in a dining chair. Avoid sitting in a soft couch or armchair.  Be quiet. Do not talk. How to take your blood pressure Follow the instructions that came with your machine. If you have a digital blood pressure monitor, these may be the instructions: 1. Sit up straight. 2. Place your feet on the floor. Do not cross your ankles or legs. 3. Rest your left arm at the level of your heart. You may rest it on a  table, desk, or chair. 4. Pull up your shirt sleeve. 5. Wrap the blood pressure cuff around the upper part of your left arm. The cuff should be 1 inch (2.5 cm) above your elbow. It is best to wrap the cuff around bare skin. 6. Fit the cuff snugly around your arm. You should be able to place only one finger between the cuff and your arm. 7. Put the cord inside the groove of your elbow. 8. Press the power button. 9. Sit quietly while the cuff fills with air and loses air. 10. Write down the numbers on the screen. 11. Wait  2-3 minutes and then repeat steps 1-10. What do the numbers mean? Two numbers make up your blood pressure. The first number is called systolic pressure. The second is called diastolic pressure. An example of a blood pressure reading is "120 over 80" (or 120/80). If you are an adult and do not have a medical condition, use this guide to find out if your blood pressure is normal: Normal  First number: below 120.  Second number: below 80. Elevated  First number: 120-129.  Second number: below 80. Hypertension stage 1  First number: 130-139.  Second number: 80-89. Hypertension stage 2  First number: 140 or above.  Second number: 58 or above. Your blood pressure is above normal even if only the top or bottom number is above normal. Follow these instructions at home:  Check your blood pressure as often as your doctor tells you to.  Take your monitor to your next doctor's appointment. Your doctor will: ? Make sure you are using it correctly. ? Make sure it is working right.  Make sure you understand what your blood pressure numbers should be.  Tell your doctor if your medicines are causing side effects. Contact a doctor if:  Your blood pressure keeps being high. Get help right away if:  Your first blood pressure number is higher than 180.  Your second blood pressure number is higher than 120. This information is not intended to replace advice given to you by your health care provider. Make sure you discuss any questions you have with your health care provider. Document Revised: 07/09/2017 Document Reviewed: 01/03/2016 Elsevier Patient Education  2020 [employer].

## 2019-10-31 ENCOUNTER — Encounter: Payer: No Typology Code available for payment source | Admitting: *Deleted

## 2019-10-31 DIAGNOSIS — Z951 Presence of aortocoronary bypass graft: Secondary | ICD-10-CM | POA: Diagnosis not present

## 2019-10-31 LAB — GLUCOSE, CAPILLARY
Glucose-Capillary: 136 mg/dL — ABNORMAL HIGH (ref 70–99)
Glucose-Capillary: 120 mg/dL — ABNORMAL HIGH (ref 70–99)

## 2019-10-31 NOTE — Progress Notes (Signed)
Daily Session Note  Patient Details  Name: YUAN GANN MRN: [id number] Date of Birth: [birth year] Referring Provider:     Cardiac Rehab from 10/18/2019 in [facility] Cardiac and Pul[facility] Re[facility]  Referring Provider  [name]      Encounter Date: 10/31/2019  Check In: Session Check In - 10/31/19 0746      Check-In   Supervising physician immediately available to respond to emergencies  See telemetry face sheet for immediately available ER MD    Location  [facility]-Cardiac & Pulmonary Rehab    Staff Present  Nada Maclachlan, BA, ACSM CEP, Exercise Physiologist;Jessica Luan [place], MA, RCEP, CCRP, CCET;Leeza Heiner RCP,RRT,BSRT;Melissa Thayer RDN, LDN;Meredith [place], RN BSN    Virtual Visit  No    Medication changes reported      No    Fall or balance concerns reported     No    Warm-up and Cool-down  Performed on first and last piece of Teacher, music Performed  Yes    VAD Patient?  No    PAD/SET Patient?  No      Pain Assessment   Currently in Pain?  No/denies    Multiple Pain Sites  No          Social History   Tobacco Use  Smoking Status Former Smoker  . Packs/day: 0.25  . Years: 5.00  . Pack years: 1.25  . Types: Cigarettes  . Quit date: 33  . Years since quitting: 26.2  Smokeless Tobacco Never Used    Goals Met:  Independence with exercise equipment Exercise tolerated well No report of cardiac concerns or symptoms Strength training completed today  Goals Unmet:  Not Applicable  Comments: Pt able to follow exercise prescription today without complaint.  Will continue to monitor for progression.    Dr. Emily Filbert is Medical Director for [name].

## 2019-11-01 ENCOUNTER — Encounter: Payer: Self-pay | Admitting: *Deleted

## 2019-11-01 DIAGNOSIS — Z951 Presence of aortocoronary bypass graft: Secondary | ICD-10-CM

## 2019-11-01 NOTE — Progress Notes (Signed)
Cardiac Individual Treatment Plan  Patient Details  Name: KOSTA SCHNITZLER MRN: [id number] Date of Birth: [birth year] Referring Provider:     Cardiac Rehab from 10/18/2019 in [facility] Cardiac and Pulmonary Rehab  Referring Provider  Arida      Initial Encounter Date:    Cardiac Rehab from 10/18/2019 in [facility] Cardiac and Pulmonary Rehab  Date  10/18/19      Visit Diagnosis: S/P CABG x 4  Patient's Home Medications on Admission:  Current Outpatient Medications:  .  allopurinol (ZYLOPRIM) 300 MG tablet, Take 300 mg by mouth daily., Disp: , Rfl:  .  amiodarone (PACERONE) 200 MG tablet, Take 1 tablet (200 mg total) by mouth 2 (two) times daily., Disp: 30 tablet, Rfl: 1 .  aspirin EC 81 MG tablet, Take 81 mg by mouth daily., Disp: , Rfl:  .  atorvastatin (LIPITOR) 80 MG tablet, Take 80 mg by mouth daily., Disp: , Rfl:  .  betamethasone valerate lotion (VALISONE) 0.1 %, Apply 1 application topically daily as needed for irritation., Disp: , Rfl:  .  clopidogrel (PLAVIX) 75 MG tablet, Take 1 tablet (75 mg total) by mouth daily., Disp: 30 tablet, Rfl: 2 .  colchicine 0.6 MG tablet, Take 1 tablet (0.6 mg total) by mouth daily., Disp: 30 tablet, Rfl: 0 .  cyclobenzaprine (FLEXERIL) 5 MG tablet, Take 1 tablet (5 mg total) by mouth 3 (three) times daily as needed for muscle spasms., Disp: 15 tablet, Rfl: 0 .  desonide (DESOWEN) 0.05 % cream, Apply 1 application topically 2 (two) times daily., Disp: , Rfl:  .  diclofenac Sodium (VOLTAREN) 1 % GEL, Apply 4 g topically 4 (four) times daily., Disp: , Rfl:  .  glucose blood test strip, Test blood ac and hs...the patient has been instructed., Disp: 100 each, Rfl: 12 .  indomethacin (INDOCIN) 50 MG capsule, Take 50 mg by mouth daily as needed. Gout pain, Disp: , Rfl:  .  ketoconazole (NIZORAL) 2 % shampoo, Apply 1 application topically 2 (two) times a week., Disp: , Rfl:  .  Lancets (ONETOUCH ULTRASOFT) lancets, Use as instructed...test ac and hs., Disp: 100  each, Rfl: 12 .  metFORMIN (GLUCOPHAGE) 500 MG tablet, Take 500 mg by mouth daily with breakfast., Disp: , Rfl:  .  metoprolol tartrate (LOPRESSOR) 25 MG tablet, Take 12.5 mg by mouth 2 (two) times daily., Disp: , Rfl:  .  nitroGLYCERIN (NITROSTAT) 0.4 MG SL tablet, Place 1 tablet (0.4 mg total) under the tongue every 5 (five) minutes as needed for chest pain., Disp: 25 tablet, Rfl: 3  Past Medical History: Past Medical History:  Diagnosis Date  . CAD (coronary artery disease)    severe distal left main stenosis with plaque rupture, extending into the ostial LAD and occluded distal LAD, likely from embolization.  He had mild to moderate pRCA stenosis. He underwent POBA of distal LAD  . Diabetes mellitus without complication ([place])   . HFrEF (heart failure with reduced ejection fraction) (HCC)    2/6 TTE 45-50%, severe LVH, G1DD  . History of tobacco use   . Hyperlipidemia   . Hypertension   . OSA on CPAP    wears CPAP qHS per pt  . PAC (premature atrial contraction)    PAC verus post cabg AF on amiodarone s/p cath. Not on [place].  . S/P CABG x 4    2/7 LIMA-LAD, SVG-diagonal, SVG-OM1 to OM2    Tobacco Use: Social History   Tobacco Use  Smoking Status  Former Smoker  . Packs/day: 0.25  . Years: 5.00  . Pack years: 1.25  . Types: Cigarettes  . Quit date: 51  . Years since quitting: 26.2  Smokeless Tobacco Never Used    Labs: Recent Chemical engineer    Labs for ITP Cardiac and Pulmonary Rehab Latest Ref Rng & Units 09/17/2019 09/18/2019 09/18/2019 09/20/2019 09/21/2019   Hemoglobin A1c 4.8 - 5.6 % - - - 6.5(H) 6.6(H)   PHART 7.350 - 7.450 7.343(L) 7.320(L) 7.302(L) - -   PCO2ART 32.0 - 48.0 mmHg 41.2 42.0 44.7 - -   HCO3 20.0 - 28.0 mmol/L 22.4 21.3 21.7 - -   TCO2 22 - 32 mmol/L _0 - -   ACIDBASEDEF 0.0 - 2.0 mmol/L 3.0(H) 4.0(H) 4.0(H) - -   O2SAT % 95.0 95.0 92.0 - -       Exercise Target Goals: Exercise Program Goal: Individual exercise prescription set using  results from initial 6 min walk test and THRR while considering  patient's activity barriers and safety.   Exercise Prescription Goal: Initial exercise prescription builds to 30-45 minutes a day of aerobic activity, 2-3 days per week.  Home exercise guidelines will be given to patient during program as part of exercise prescription that the participant will acknowledge.  Activity Barriers & Risk Stratification:   6 Minute Walk: 6 Minute Walk    Row Name 10/18/19 1308         6 Minute Walk   Phase  Initial     Distance  1172 feet     Walk Time  6 minutes     # of Rest Breaks  0     MPH  2.22     METS  2.22     RPE  7     Perceived Dyspnea   0     VO2 Peak  7.8     Symptoms  No     Resting HR  60 bpm     Resting BP  112/60     Resting Oxygen Saturation   100 %     Exercise Oxygen Saturation  during 6 min walk  98 %     Max Ex. HR  91 bpm     Max Ex. BP  146/64     2 Minute Post BP  120/66        Oxygen Initial Assessment:   Oxygen Re-Evaluation:   Oxygen Discharge (Final Oxygen Re-Evaluation):   Initial Exercise Prescription: Initial Exercise Prescription - 10/18/19 1300      Date of Initial Exercise RX and Referring Provider   Date  10/18/19    Referring Provider  Arida      Treadmill   MPH  2    Grade  0    Minutes  15    METs  2.5      Recumbant Bike   Level  2    RPM  60    Minutes  15    METs  2.2      Arm Ergometer   Level  1    RPM  30    Minutes  15    METs  2.2      REL-XR   Level  2    Speed  50    Minutes  15    METs  2.2      Prescription Details   Frequency (times per week)  3    Duration  Progress to 30  minutes of continuous aerobic without signs/symptoms of physical distress      Intensity   THRR 40-80% of Max Heartrate  95-130    Ratings of Perceived Exertion  11-13    Perceived Dyspnea  0-4      Resistance Training   Training Prescription  Yes    Weight  3 lb    Reps  10-15       Perform Capillary Blood Glucose  checks as needed.  Exercise Prescription Changes: Exercise Prescription Changes    Row Name 10/18/19 1300 10/24/19 1100           Response to Exercise   Blood Pressure (Admit)  112/60  124/60      Blood Pressure (Exercise)  146/64  138/70      Blood Pressure (Exit)  120/66  125/58      Heart Rate (Admit)  60 bpm  78 bpm      Heart Rate (Exercise)  91 bpm  93 bpm      Heart Rate (Exit)  67 bpm  71 bpm      Oxygen Saturation (Admit)  100 %  --      Oxygen Saturation (Exercise)  98 %  --      Rating of Perceived Exertion (Exercise)  7  13      Perceived Dyspnea (Exercise)  0  --      Symptoms  none  none      Duration  --  Continue with 30 min of aerobic exercise without signs/symptoms of physical distress.      Intensity  --  THRR unchanged        Resistance Training   Training Prescription  --  Yes      Weight  --  3 lb      Reps  --  10-15        Interval Training   Interval Training  --  No        Treadmill   MPH  --  2      Grade  --  0      Minutes  --  15      METs  --  2.5        Elliptical   Level  --  1      Minutes  --  15         Exercise Comments: Exercise Comments    Row Name 10/24/19 0831           Exercise Comments  First full day of exercise!  Patient was oriented to gym and equipment including functions, settings, policies, and procedures.  Patient's individual exercise prescription and treatment plan were reviewed.  All starting workloads were established based on the results of the 6 minute walk test done at initial orientation visit.  The plan for exercise progression was also introduced and progression will be customized based on patient's performance and goals.          Exercise Goals and Review: Exercise Goals    Row Name 10/18/19 1317             Exercise Goals   Increase Physical Activity  Yes       Intervention  Provide advice, education, support and counseling about physical activity/exercise needs.;Develop an individualized  exercise prescription for aerobic and resistive training based on initial evaluation findings, risk stratification, comorbidities and participant's personal goals.       Expected Outcomes  Short Term: Attend  rehab on a regular basis to increase amount of physical activity.;Long Term: Add in home exercise to make exercise part of routine and to increase amount of physical activity.;Long Term: Exercising regularly at least 3-5 days a week.       Increase Strength and Stamina  Yes       Intervention  Provide advice, education, support and counseling about physical activity/exercise needs.;Develop an individualized exercise prescription for aerobic and resistive training based on initial evaluation findings, risk stratification, comorbidities and participant's personal goals.       Expected Outcomes  Short Term: Increase workloads from initial exercise prescription for resistance, speed, and METs.;Short Term: Perform resistance training exercises routinely during rehab and add in resistance training at home;Long Term: Improve cardiorespiratory fitness, muscular endurance and strength as measured by increased METs and functional capacity (6MWT)       Able to understand and use rate of perceived exertion (RPE) scale  Yes       Intervention  Provide education and explanation on how to use RPE scale       Expected Outcomes  Short Term: Able to use RPE daily in rehab to express subjective intensity level;Long Term:  Able to use RPE to guide intensity level when exercising independently       Able to understand and use Dyspnea scale  Yes       Intervention  Provide education and explanation on how to use Dyspnea scale       Expected Outcomes  Short Term: Able to use Dyspnea scale daily in rehab to express subjective sense of shortness of breath during exertion;Long Term: Able to use Dyspnea scale to guide intensity level when exercising independently       Knowledge and understanding of Target Heart Rate Range  (THRR)  Yes       Intervention  Provide education and explanation of THRR including how the numbers were predicted and where they are located for reference       Expected Outcomes  Short Term: Able to state/look up THRR;Short Term: Able to use daily as guideline for intensity in rehab;Long Term: Able to use THRR to govern intensity when exercising independently       Able to check pulse independently  Yes       Intervention  Provide education and demonstration on how to check pulse in carotid and radial arteries.;Review the importance of being able to check your own pulse for safety during independent exercise       Expected Outcomes  Short Term: Able to explain why pulse checking is important during independent exercise;Long Term: Able to check pulse independently and accurately       Understanding of Exercise Prescription  Yes       Intervention  Provide education, explanation, and written materials on patient's individual exercise prescription       Expected Outcomes  Short Term: Able to explain program exercise prescription;Long Term: Able to explain home exercise prescription to exercise independently          Exercise Goals Re-Evaluation : Exercise Goals Re-Evaluation    Row Name 10/24/19 0831             Exercise Goal Re-Evaluation   Exercise Goals Review  Able to understand and use rate of perceived exertion (RPE) scale;Knowledge and understanding of Target Heart Rate Range (THRR);Understanding of Exercise Prescription       Comments  Reviewed RPE scale, THR and program prescription with pt today.  Pt voiced understanding  and was given a copy of goals to take home.       Expected Outcomes  Short: Use RPE daily to regulate intensity. Long: Follow program prescription in THR.          Discharge Exercise Prescription (Final Exercise Prescription Changes): Exercise Prescription Changes - 10/24/19 1100      Response to Exercise   Blood Pressure (Admit)  124/60    Blood Pressure  (Exercise)  138/70    Blood Pressure (Exit)  125/58    Heart Rate (Admit)  78 bpm    Heart Rate (Exercise)  93 bpm    Heart Rate (Exit)  71 bpm    Rating of Perceived Exertion (Exercise)  13    Symptoms  none    Duration  Continue with 30 min of aerobic exercise without signs/symptoms of physical distress.    Intensity  THRR unchanged      Resistance Training   Training Prescription  Yes    Weight  3 lb    Reps  10-15      Interval Training   Interval Training  No      Treadmill   MPH  2    Grade  0    Minutes  15    METs  2.5      Elliptical   Level  1    Minutes  15       Nutrition:  Target Goals: Understanding of nutrition guidelines, daily intake of sodium <1544m, cholesterol <2079m calories 30% from fat and 7% or less from saturated fats, daily to have 5 or more servings of fruits and vegetables.  Biometrics: Pre Biometrics - 10/18/19 1318      Pre Biometrics   Height  _0  (1.676 m)    Weight  201 lb 14.4 oz (91.6 kg)    BMI (Calculated)  32.6        Nutrition Therapy Plan and Nutrition Goals:   Nutrition Assessments: Nutrition Assessments - 10/19/19 1124      MEDFICTS Scores   Pre Score  99       Nutrition Goals Re-Evaluation:   Nutrition Goals Discharge (Final Nutrition Goals Re-Evaluation):   Psychosocial: Target Goals: Acknowledge presence or absence of significant depression and/or stress, maximize coping skills, provide positive support system. Participant is able to verbalize types and ability to use techniques and skills needed for reducing stress and depression.   Initial Review & Psychosocial Screening: Initial Psych Review & Screening - 10/17/19 1311      Initial Review   Current issues with  Current Sleep Concerns      Family Dynamics   Good Support System?  Yes    Comments  His incision is still tender which is making it hard to sleep. He can look to his wife for support.      Barriers   Psychosocial barriers to  participate in program  There are no identifiable barriers or psychosocial needs.      Screening Interventions   Interventions  Provide feedback about the scores to participant;Encouraged to exercise;Program counselor consult;To provide support and resources with identified psychosocial needs    Expected Outcomes  Short Term goal: Utilizing psychosocial counselor, staff and physician to assist with identification of specific Stressors or current issues interfering with healing process. Setting desired goal for each stressor or current issue identified.;Long Term Goal: Stressors or current issues are controlled or eliminated.;Short Term goal: Identification and review with participant of any Quality of Life or  Depression concerns found by scoring the questionnaire.;Long Term goal: The participant improves quality of Life and PHQ9 Scores as seen by post scores and/or verbalization of changes       Quality of Life Scores:  Quality of Life - 10/18/19 1320      Quality of Life   Select  Quality of Life      Quality of Life Scores   Health/Function Pre  28.8 %    Socioeconomic Pre  29.14 %    Psych/Spiritual Post  30 %    Family Pre  30 %      Scores of 19 and below usually indicate a poorer quality of life in these areas.  A difference of  2-3 points is a clinically meaningful difference.  A difference of 2-3 points in the total score of the Quality of Life Index has been associated with significant improvement in overall quality of life, self-image, physical symptoms, and general health in studies assessing change in quality of life.  PHQ-9: Recent Review Flowsheet Data    Depression screen [facility] 2/9 10/18/2019   Decreased Interest 1   Down, Depressed, Hopeless 1   PHQ - 2 Score 2   Altered sleeping 1   Tired, decreased energy 1   Change in appetite 1   Feeling bad or failure about yourself  0   Trouble concentrating 1   Moving slowly or fidgety/restless 1   Suicidal thoughts 0   PHQ-9  Score 7   Difficult doing work/chores Somewhat difficult     Interpretation of Total Score  Total Score Depression Severity:  1-4 = Minimal depression, 5-9 = Mild depression, 10-14 = Moderate depression, 15-19 = Moderately severe depression, 20-27 = Severe depression   Psychosocial Evaluation and Intervention: Psychosocial Evaluation - 10/17/19 1312      Psychosocial Evaluation & Interventions   Interventions  Encouraged to exercise with the program and follow exercise prescription    Comments  His incision is still tender which is making it hard to sleep. He can look to his wife for support.    Expected Outcomes  Short: Attend [facility] stress management education to decrease stress. Long: Maintain exercise Post [facility] to keep stress at a minimum.    Continue Psychosocial Services   Follow up required by staff       Psychosocial Re-Evaluation:   Psychosocial Discharge (Final Psychosocial Re-Evaluation):   Vocational Rehabilitation: Provide vocational rehab assistance to qualifying candidates.   Vocational Rehab Evaluation & Intervention:   Education: Education Goals: Education classes will be provided on a variety of topics geared toward better understanding of heart health and risk factor modification. Participant will state understanding/return demonstration of topics presented as noted by education test scores.  Learning Barriers/Preferences: Learning Barriers/Preferences - 10/17/19 1313      Learning Barriers/Preferences   Learning Barriers  None    Learning Preferences  None       Education Topics:  AED/CPR: - Group verbal and written instruction with the use of models to demonstrate the basic use of the AED with the basic ABC's of resuscitation.   General Nutrition Guidelines/Fats and Fiber: -Group instruction provided by verbal, written material, models and posters to present the general guidelines for heart healthy nutrition. Gives an explanation and  review of dietary fats and fiber.   Controlling Sodium/Reading Food Labels: -Group verbal and written material supporting the discussion of sodium use in heart healthy nutrition. Review and explanation with models, verbal and written materials for utilization of  the food label.   Exercise Physiology & General Exercise Guidelines: - Group verbal and written instruction with models to review the exercise physiology of the cardiovascular system and associated critical values. Provides general exercise guidelines with specific guidelines to those with heart or lung disease.    Aerobic Exercise & Resistance Training: - Gives group verbal and written instruction on the various components of exercise. Focuses on aerobic and resistive training programs and the benefits of this training and how to safely progress through these programs..   Flexibility, Balance, Mind/Body Relaxation: Provides group verbal/written instruction on the benefits of flexibility and balance training, including mind/body exercise modes such as yoga, pilates and tai chi.  Demonstration and skill practice provided.   Stress and Anxiety: - Provides group verbal and written instruction about the health risks of elevated stress and causes of high stress.  Discuss the correlation between heart/lung disease and anxiety and treatment options. Review healthy ways to manage with stress and anxiety.   Depression: - Provides group verbal and written instruction on the correlation between heart/lung disease and depressed mood, treatment options, and the stigmas associated with seeking treatment.   Anatomy & Physiology of the Heart: - Group verbal and written instruction and models provide basic cardiac anatomy and physiology, with the coronary electrical and arterial systems. Review of Valvular disease and Heart Failure   Cardiac Procedures: - Group verbal and written instruction to review commonly prescribed medications for heart  disease. Reviews the medication, class of the drug, and side effects. Includes the steps to properly store meds and maintain the prescription regimen. (beta blockers and nitrates)   Cardiac Medications I: - Group verbal and written instruction to review commonly prescribed medications for heart disease. Reviews the medication, class of the drug, and side effects. Includes the steps to properly store meds and maintain the prescription regimen.   Cardiac Medications II: -Group verbal and written instruction to review commonly prescribed medications for heart disease. Reviews the medication, class of the drug, and side effects. (all other drug classes)    Go Sex-Intimacy & Heart Disease, Get SMART - Goal Setting: - Group verbal and written instruction through game format to discuss heart disease and the return to sexual intimacy. Provides group verbal and written material to discuss and apply goal setting through the application of the S.M.A.R.T. Method.   Other Matters of the Heart: - Provides group verbal, written materials and models to describe Stable Angina and Peripheral Artery. Includes description of the disease process and treatment options available to the cardiac patient.   Exercise & Equipment Safety: - Individual verbal instruction and demonstration of equipment use and safety with use of the equipment.   Cardiac Rehab from 10/18/2019 in [facility] Cardiac and Pulmonary Rehab  Date  10/18/19  Educator  AS  Instruction Review Code  1- Verbalizes Understanding      Infection Prevention: - Provides verbal and written material to individual with discussion of infection control including proper hand washing and proper equipment cleaning during exercise session.   Cardiac Rehab from 10/18/2019 in [facility] Cardiac and Pulmonary Rehab  Date  10/18/19  Educator  AS  Instruction Review Code  1- Verbalizes Understanding      Falls Prevention: - Provides verbal and written material to  individual with discussion of falls prevention and safety.   Cardiac Rehab from 10/18/2019 in [facility] Cardiac and Pulmonary Rehab  Date  10/18/19  Educator  AS  Instruction Review Code  1- Verbalizes Understanding  Diabetes: - Individual verbal and written instruction to review signs/symptoms of diabetes, desired ranges of glucose level fasting, after meals and with exercise. Acknowledge that pre and post exercise glucose checks will be done for 3 sessions at entry of program.   Cardiac Rehab from 10/18/2019 in [facility] Cardiac and Pulmonary Rehab  Date  10/18/19  Educator  AS  Instruction Review Code  1- Verbalizes Understanding      Know Your Numbers and Risk Factors: -Group verbal and written instruction about important numbers in your health.  Discussion of what are risk factors and how they play a role in the disease process.  Review of Cholesterol, Blood Pressure, Diabetes, and BMI and the role they play in your overall health.   Sleep Hygiene: -Provides group verbal and written instruction about how sleep can affect your health.  Define sleep hygiene, discuss sleep cycles and impact of sleep habits. Review good sleep hygiene tips.    Other: -Provides group and verbal instruction on various topics (see comments)   Knowledge Questionnaire Score: Knowledge Questionnaire Score - 10/18/19 1320      Knowledge Questionnaire Score   Pre Score  24/26       Core Components/Risk Factors/Patient Goals at Admission: Personal Goals and Risk Factors at Admission - 10/18/19 1319      Core Components/Risk Factors/Patient Goals on Admission    Weight Management  Yes;Weight Maintenance;Weight Loss    Admit Weight  201 lb 14.4 oz (91.6 kg)    Goal Weight: Short Term  190 lb (86.2 kg)    Goal Weight: Long Term  180 lb (81.6 kg)    Expected Outcomes  Short Term: Continue to assess and modify interventions until short term weight is achieved;Long Term: Adherence to nutrition and physical  activity/exercise program aimed toward attainment of established weight goal;Weight Loss: Understanding of general recommendations for a balanced deficit meal plan, which promotes 1-2 lb weight loss per week and includes a negative energy balance of [phone] kcal/d;Understanding recommendations for meals to include 15-35% energy as protein, 25-35% energy from fat, 35-60% energy from carbohydrates, less than 291m of dietary cholesterol, 20-35 gm of total fiber daily;Understanding of distribution of calorie intake throughout the day with the consumption of 4-5 meals/snacks    Diabetes  Yes    Intervention  Provide education about signs/symptoms and action to take for hypo/hyperglycemia.;Provide education about proper nutrition, including hydration, and aerobic/resistive exercise prescription along with prescribed medications to achieve blood glucose in normal ranges: Fasting glucose 65-99 mg/dL    Expected Outcomes  Short Term: Participant verbalizes understanding of the signs/symptoms and immediate care of hyper/hypoglycemia, proper foot care and importance of medication, aerobic/resistive exercise and nutrition plan for blood glucose control.;Long Term: Attainment of HbA1C < 7%.       Core Components/Risk Factors/Patient Goals Review:    Core Components/Risk Factors/Patient Goals at Discharge (Final Review):    ITP Comments: ITP Comments    Row Name 10/17/19 1310 10/24/19 0831 11/01/19 [id number]      ITP Comments  Virtual Orientation performed. Patient informed when to come in for RD and EP orientation. [facility]  First full day of exercise!  Patient was oriented to gym and equipment including functions, settings, policies, and procedures.  Patient's individual exercise prescription and treatment plan were reviewed.  All starting workloads were established based on the results of the 6 minute walk test done at initial orientation visit.  The plan for exercise progression  was  also introduced and progression will be customized based on patient's performance and goals.  30 day chart review completed. ITP sent to Dr Zachery Dakins Medical Director, for review,changes as needed and signature. Continue with ITP if no changes requested        Comments: first day of exercise 3/16

## 2019-11-02 ENCOUNTER — Encounter: Payer: No Typology Code available for payment source | Admitting: *Deleted

## 2019-11-02 ENCOUNTER — Other Ambulatory Visit: Payer: Self-pay

## 2019-11-02 DIAGNOSIS — Z951 Presence of aortocoronary bypass graft: Secondary | ICD-10-CM

## 2019-11-02 NOTE — Progress Notes (Signed)
Daily Session Note  Patient Details  Name: Brian Chapman MRN: [id number] Date of Birth: [birth year] Referring Provider:     Cardiac Rehab from 10/18/2019 in [facility] Cardiac and Pulmonary Rehab  Referring Provider  [name]      Encounter Date: 11/02/2019  Check In: Session Check In - 11/02/19 1470      Check-In   Supervising physician immediately available to respond to emergencies  See telemetry face sheet for immediately available ER MD    Location  [facility]-Cardiac & Pulmonary Rehab    Staff Present  Heath Lark, RN, BSN, CCRP;Jessica [place], [place], RCEP, CCRP, CCET    Virtual Visit  No    Medication changes reported      No    Fall or balance concerns reported     No    Warm-up and Cool-down  Performed on first and last piece of equipment    Resistance Training Performed  Yes    VAD Patient?  No    PAD/SET Patient?  No      Pain Assessment   Currently in Pain?  No/denies          Social History   Tobacco Use  Smoking Status Former Smoker  . Packs/day: 0.25  . Years: 5.00  . Pack years: 1.25  . Types: Cigarettes  . Quit date: 15  . Years since quitting: 26.2  Smokeless Tobacco Never Used    Goals Met:  Exercise tolerated well No report of cardiac concerns or symptoms  Goals Unmet:  Not Applicable  Comments: Pt able to follow exercise prescription today without complaint.  Will continue to monitor for progression.    Dr. Emily Filbert is Medical Director for [place] and [name].

## 2019-11-07 ENCOUNTER — Encounter: Payer: No Typology Code available for payment source | Admitting: *Deleted

## 2019-11-07 ENCOUNTER — Other Ambulatory Visit: Payer: Self-pay

## 2019-11-07 DIAGNOSIS — Z951 Presence of aortocoronary bypass graft: Secondary | ICD-10-CM | POA: Diagnosis not present

## 2019-11-07 NOTE — Progress Notes (Signed)
Daily Session Note  Patient Details  Name: SAMARION EHLE MRN: [id number] Date of Birth: 07/19/1946 Referring Provider:     Cardiac Rehab from 10/18/2019 in [facility] Cardiac and Pulmonary Rehab  Referring Provider  [name]      Encounter Date: 11/07/2019  Check In: Session Check In - 11/07/19 0925      Check-In   Supervising physician immediately available to respond to emergencies  See telemetry face sheet for immediately available ER MD    Location  [facility]-Cardiac & Pulmonary Rehab    Staff Present  Heath Lark, RN, BSN, CCRP;Jessica [place], [place], RCEP, CCRP, CCET;Joseph [place] RCP,RRT,BSRT    Virtual Visit  No    Medication changes reported      No    Fall or balance concerns reported     No    Warm-up and Cool-down  Performed on first and last piece of equipment    Resistance Training Performed  Yes    VAD Patient?  No    PAD/SET Patient?  No      Pain Assessment   Currently in Pain?  No/denies          Social History   Tobacco Use  Smoking Status Former Smoker  . Packs/day: 0.25  . Years: 5.00  . Pack years: 1.25  . Types: Cigarettes  . Quit date: 28  . Years since quitting: 26.2  Smokeless Tobacco Never Used    Goals Met:  Independence with exercise equipment Exercise tolerated well No report of cardiac concerns or symptoms  Goals Unmet:  Not Applicable  Comments: Pt able to follow exercise prescription today without complaint.  Will continue to monitor for progression.    Dr. Emily Filbert is Medical Director for [place] and [name].

## 2019-11-09 ENCOUNTER — Encounter: Payer: No Typology Code available for payment source | Attending: Cardiovascular Disease | Admitting: *Deleted

## 2019-11-09 ENCOUNTER — Other Ambulatory Visit: Payer: Self-pay

## 2019-11-09 DIAGNOSIS — Z951 Presence of aortocoronary bypass graft: Secondary | ICD-10-CM | POA: Diagnosis not present

## 2019-11-09 NOTE — Progress Notes (Signed)
Daily Session Note  Patient Details  Name: Brian Chapman MRN: [id number] Date of Birth: 07/03/1946 Referring Provider:     Cardiac Rehab from 10/18/2019 in [facility] Cardiac and Pulmonary Rehab  Referring Provider  Arida      Encounter Date: 11/09/2019  Check In: Session Check In - 11/09/19 0750      Check-In   Staff Present  Mary Jo Abernethy, RN, BSN, MA;Laureen Brown, BS, RRT, CPFT;Susanne Bice, RN, BSN, CCRP    Virtual Visit  No    Medication changes reported      No    Fall or balance concerns reported     No    Warm-up and Cool-down  Performed on first and last piece of equipment    Resistance Training Performed  Yes    VAD Patient?  No    PAD/SET Patient?  No      Pain Assessment   Currently in Pain?  No/denies          Social History   Tobacco Use  Smoking Status Former Smoker  . Packs/day: 0.25  . Years: 5.00  . Pack years: 1.25  . Types: Cigarettes  . Quit date: 1995  . Years since quitting: 26.2  Smokeless Tobacco Never Used    Goals Met:  Independence with exercise equipment Exercise tolerated well No report of cardiac concerns or symptoms  Goals Unmet:  Not Applicable  Comments: Pt able to follow exercise prescription today without complaint.  Will continue to monitor for progression.   Dr. Mark Miller is Medical Director for [facility] and [name]. 

## 2019-11-14 ENCOUNTER — Other Ambulatory Visit: Payer: Self-pay

## 2019-11-14 ENCOUNTER — Encounter: Payer: No Typology Code available for payment source | Admitting: *Deleted

## 2019-11-14 DIAGNOSIS — Z951 Presence of aortocoronary bypass graft: Secondary | ICD-10-CM | POA: Diagnosis not present

## 2019-11-14 NOTE — Progress Notes (Signed)
Daily Session Note  Patient Details  Name: Brian Chapman MRN: [id number] Date of Birth: 1946-06-15 Referring Provider:     Cardiac Rehab from 10/18/2019 in [facility] Cardiac and Pulmonary Rehab  Referring Provider  [name]      Encounter Date: 11/14/2019  Check In: Session Check In - 11/14/19 0835      Check-In   Supervising physician immediately available to respond to emergencies  See telemetry face sheet for immediately available ER MD    Location  [facility]-Cardiac & Pulmonary Rehab    Staff Present  Heath Lark, RN, BSN, Lance Sell, BA, ACSM CEP, Exercise Physiologist;Joseph Hood RCP,RRT,BSRT    Virtual Visit  No    Medication changes reported      No    Fall or balance concerns reported     No    Warm-up and Cool-down  Performed on first and last piece of equipment    Resistance Training Performed  Yes    VAD Patient?  No    PAD/SET Patient?  No      Pain Assessment   Currently in Pain?  No/denies          Social History   Tobacco Use  Smoking Status Former Smoker  . Packs/day: 0.25  . Years: 5.00  . Pack years: 1.25  . Types: Cigarettes  . Quit date: 66  . Years since quitting: 26.2  Smokeless Tobacco Never Used    Goals Met:  Independence with exercise equipment Exercise tolerated well No report of cardiac concerns or symptoms  Goals Unmet:  Not Applicable  Comments: Pt able to follow exercise prescription today without complaint.  Will continue to monitor for progression.    Dr. Emily Filbert is Medical Director for [place] and [name].

## 2019-11-16 ENCOUNTER — Other Ambulatory Visit: Payer: Self-pay

## 2019-11-16 DIAGNOSIS — Z951 Presence of aortocoronary bypass graft: Secondary | ICD-10-CM | POA: Diagnosis not present

## 2019-11-16 NOTE — Progress Notes (Signed)
Daily Session Note  Patient Details  Name: Brian Chapman MRN: [id number] Date of Birth: 1945-11-20 Referring Provider:     Cardiac Rehab from 10/18/2019 in [facility] Cardiac and Pulmonary Rehab  Referring Provider  [name]      Encounter Date: 11/16/2019  Check In: Session Check In - 11/16/19 0748      Check-In   Supervising physician immediately available to respond to emergencies  See telemetry face sheet for immediately available ER MD    Location  [facility]-Cardiac & Pulmonary Rehab    Staff Present  Vida Rigger RN, Vickki Hearing, BA, ACSM CEP, Exercise Physiologist;Melissa Caiola RDN, LDN;Susanne Bice, RN, BSN, CCRP    Virtual Visit  No    Medication changes reported      No    Fall or balance concerns reported     No    Warm-up and Cool-down  Performed on first and last piece of equipment    Resistance Training Performed  Yes    VAD Patient?  No    PAD/SET Patient?  No      Pain Assessment   Currently in Pain?  No/denies          Social History   Tobacco Use  Smoking Status Former Smoker  . Packs/day: 0.25  . Years: 5.00  . Pack years: 1.25  . Types: Cigarettes  . Quit date: 64  . Years since quitting: 26.2  Smokeless Tobacco Never Used    Goals Met:  Independence with exercise equipment Exercise tolerated well No report of cardiac concerns or symptoms Strength training completed today  Goals Unmet:  Not Applicable  Comments: Pt able to follow exercise prescription today without complaint.  Will continue to monitor for progression.   Dr. Emily Filbert is Medical Director for [place] and [name].

## 2019-11-20 ENCOUNTER — Other Ambulatory Visit: Payer: Self-pay

## 2019-11-20 DIAGNOSIS — Z951 Presence of aortocoronary bypass graft: Secondary | ICD-10-CM

## 2019-11-20 NOTE — Progress Notes (Signed)
Completed Initial RD Eval 

## 2019-11-21 ENCOUNTER — Encounter: Payer: Self-pay | Admitting: *Deleted

## 2019-11-21 ENCOUNTER — Encounter: Payer: No Typology Code available for payment source | Admitting: *Deleted

## 2019-11-21 ENCOUNTER — Other Ambulatory Visit: Payer: Self-pay

## 2019-11-21 DIAGNOSIS — Z951 Presence of aortocoronary bypass graft: Secondary | ICD-10-CM

## 2019-11-21 NOTE — Progress Notes (Signed)
Brian Chapman arrived this AM to let us knw he was feeling fatigued. He had his COVID Vaccine yesterday.  We sent him home to rest. No session today.

## 2019-11-21 NOTE — Progress Notes (Signed)
Brian Chapman arrived to tell staff he was feeling very fatigued. He received his COVID Vaccine yesterday.  Sent him home to rest, no session today

## 2019-11-23 ENCOUNTER — Other Ambulatory Visit: Payer: Self-pay

## 2019-11-23 ENCOUNTER — Encounter: Payer: No Typology Code available for payment source | Admitting: *Deleted

## 2019-11-23 DIAGNOSIS — Z951 Presence of aortocoronary bypass graft: Secondary | ICD-10-CM

## 2019-11-23 NOTE — Progress Notes (Signed)
Daily Session Note  Patient Details  Name: Brian Chapman MRN: [id number] Date of Birth: Nov 03, 1945 Referring Provider:     Cardiac Rehab from 10/18/2019 in [facility] Cardiac and Pulmonary Rehab  Referring Provider  [name]      Encounter Date: 11/23/2019  Check In: Session Check In - 11/23/19 0814      Check-In   Supervising physician immediately available to respond to emergencies  See telemetry face sheet for immediately available ER MD    Location  [facility]-Cardiac & Pulmonary Rehab    Staff Present  Heath Lark, RN, BSN, CCRP;Laureen Owens Shark, BS, RRT, CPFT;Amanda Oletta Darter, BA, ACSM CEP, Exercise Physiologist    Virtual Visit  No    Medication changes reported      No    Fall or balance concerns reported     No    Warm-up and Cool-down  Performed on first and last piece of equipment    Resistance Training Performed  Yes    VAD Patient?  No    PAD/SET Patient?  No      Pain Assessment   Currently in Pain?  No/denies          Social History   Tobacco Use  Smoking Status Former Smoker  . Packs/day: 0.25  . Years: 5.00  . Pack years: 1.25  . Types: Cigarettes  . Quit date: 45  . Years since quitting: 26.3  Smokeless Tobacco Never Used    Goals Met:  Independence with exercise equipment Exercise tolerated well No report of cardiac concerns or symptoms  Goals Unmet:  Not Applicable  Comments: Pt able to follow exercise prescription today without complaint.  Will continue to monitor for progression.    Dr. Emily Filbert is Medical Director for [place] and [name].

## 2019-11-25 DIAGNOSIS — G4733 Obstructive sleep apnea (adult) (pediatric): Secondary | ICD-10-CM | POA: Diagnosis not present

## 2019-11-28 ENCOUNTER — Encounter: Payer: No Typology Code available for payment source | Admitting: *Deleted

## 2019-11-28 ENCOUNTER — Other Ambulatory Visit: Payer: Self-pay

## 2019-11-28 DIAGNOSIS — Z951 Presence of aortocoronary bypass graft: Secondary | ICD-10-CM

## 2019-11-28 NOTE — Progress Notes (Signed)
Daily Session Note  Patient Details  Name: Brian Chapman MRN: [id number] Date of Birth: [birth year] Referring Provider:     Cardiac Rehab from 10/18/2019 in [facility] Cardiac and Pulmonary Rehab  Referring Provider  [name]      Encounter Date: 11/28/2019  Check In: Session Check In - 11/28/19 0857      Check-In   Supervising physician immediately available to respond to emergencies  See telemetry face sheet for immediately available ER MD    Location  [facility]-Cardiac & Pulmonary Rehab    Staff Present  Heath Lark, RN, BSN, Lance Sell, BA, ACSM CEP, Exercise Physiologist;Joseph Hood RCP,RRT,BSRT    Virtual Visit  No    Medication changes reported      No    Fall or balance concerns reported     No    Warm-up and Cool-down  Performed on first and last piece of equipment    Resistance Training Performed  Yes    VAD Patient?  No    PAD/SET Patient?  No      Pain Assessment   Currently in Pain?  No/denies          Social History   Tobacco Use  Smoking Status Former Smoker  . Packs/day: 0.25  . Years: 5.00  . Pack years: 1.25  . Types: Cigarettes  . Quit date: 24  . Years since quitting: 26.3  Smokeless Tobacco Never Used    Goals Met:  Independence with exercise equipment Exercise tolerated well No report of cardiac concerns or symptoms  Goals Unmet:  Not Applicable  Comments: Pt able to follow exercise prescription today without complaint.  Will continue to monitor for progression.    Dr. Emily Filbert is Medical Director for [place] and [name].

## 2019-11-29 ENCOUNTER — Encounter: Payer: Self-pay | Admitting: Physician Assistant

## 2019-11-29 ENCOUNTER — Other Ambulatory Visit: Payer: Self-pay

## 2019-11-29 ENCOUNTER — Ambulatory Visit (INDEPENDENT_AMBULATORY_CARE_PROVIDER_SITE_OTHER): Payer: BC Managed Care – PPO | Admitting: Physician Assistant

## 2019-11-29 ENCOUNTER — Encounter: Payer: Self-pay | Admitting: *Deleted

## 2019-11-29 VITALS — BP 142/80 | HR 52 | Ht 66.5 in | Wt 212.0 lb

## 2019-11-29 DIAGNOSIS — I10 Essential (primary) hypertension: Secondary | ICD-10-CM

## 2019-11-29 DIAGNOSIS — I2510 Atherosclerotic heart disease of native coronary artery without angina pectoris: Secondary | ICD-10-CM

## 2019-11-29 DIAGNOSIS — E785 Hyperlipidemia, unspecified: Secondary | ICD-10-CM | POA: Diagnosis not present

## 2019-11-29 DIAGNOSIS — I252 Old myocardial infarction: Secondary | ICD-10-CM

## 2019-11-29 DIAGNOSIS — Z79899 Other long term (current) drug therapy: Secondary | ICD-10-CM

## 2019-11-29 DIAGNOSIS — Z951 Presence of aortocoronary bypass graft: Secondary | ICD-10-CM | POA: Diagnosis not present

## 2019-11-29 DIAGNOSIS — I5020 Unspecified systolic (congestive) heart failure: Secondary | ICD-10-CM

## 2019-11-29 DIAGNOSIS — E119 Type 2 diabetes mellitus without complications: Secondary | ICD-10-CM

## 2019-11-29 DIAGNOSIS — I480 Paroxysmal atrial fibrillation: Secondary | ICD-10-CM

## 2019-11-29 MED ORDER — CHLORTHALIDONE 25 MG PO TABS: 12.5000 mg | ORAL_TABLET | Freq: Every day | ORAL | 5 refills | Status: AC

## 2019-11-29 NOTE — Patient Instructions (Signed)
Medication Instructions:  Your physician has recommended you make the following change in your medication:   START Chlorthalidone 12.5 mg (1/2 tablet) daily. An Rx has been sent to your pharmacy.   *If you need a refill on your cardiac medications before your next appointment, please call your pharmacy*   Lab Work: Lipid, Bmet, Lft today  If you have labs (blood work) drawn today and your tests are completely normal, you will receive your results only by: Marland Kitchen MyChart Message (if you have MyChart) OR . A paper copy in the mail If you have any lab test that is abnormal or we need to change your treatment, we will call you to review the results.   Testing/Procedures: Your physician has requested that you have an echocardiogram. Echocardiography is a painless test that uses sound waves to create images of your heart. It provides your doctor with information about the size and shape of your heart and how well your heart's chambers and valves are working. This procedure takes approximately one hour. There are no restrictions for this procedure.     Follow-Up: At [facility], you and your health needs are our priority.  As part of our continuing mission to provide you with exceptional heart care, we have created designated Provider Care Teams.  These Care Teams include your primary Cardiologist (physician) and Advanced Practice Providers (APPs -  Physician Assistants and Nurse Practitioners) who all work together to provide you with the care you need, when you need it.  We recommend signing up for the patient portal called "MyChart".  Sign up information is provided on this After Visit Summary.  MyChart is used to connect with patients for Virtual Visits (Telemedicine).  Patients are able to view lab/test results, encounter notes, upcoming appointments, etc.  Non-urgent messages can be sent to your provider as well.   To learn more about what you can do with MyChart, go to  [email or web address].    Your next appointment:   4 week(s)  The format for your next appointment:   In Person  Provider:    You may see Lorine Bears, MD or one of the following Advanced Practice Providers on your designated Care Team:    Nicolasa Ducking, NP  Eula Listen, PA-C  Marisue Ivan, PA-C    Other Instructions Your physician has requested that you monitor and record your blood pressure readings at home daily 2 hours after your night time medications. Your blood pressure goal is < 130/80.Please use the same machine at the same time of day to check your readings and record them.

## 2019-11-29 NOTE — Progress Notes (Signed)
Office Visit    Patient Name: Brian Chapman Date of Encounter: 11/29/2019  Primary Care Provider:  [facility] Primary Cardiologist:  Lorine Bears, MD  Chief Complaint    Chief Complaint  Patient presents with  . office visit    1 month F/U; Meds verbally reviewed with patient.    74 year old male with history of history of CAD,STEMI 09/16/19, CABG x4 09/17/19(LIMA-LAD, SVG-diagonal, SVG-OM1 to OM2),possible post CABGAF,DM2, hypertension, hyperlipidemia,former smoker,obesity, and seen today for follow-up.   Past Medical History    Past Medical History:  Diagnosis Date  . CAD (coronary artery disease)    severe distal left main stenosis with plaque rupture, extending into the ostial LAD and occluded distal LAD, likely from embolization.  He had mild to moderate pRCA stenosis. He underwent POBA of distal LAD  . Diabetes mellitus without complication (HCC)   . HFrEF (heart failure with reduced ejection fraction) (HCC)    2/6 TTE 45-50%, severe LVH, G1DD  . History of tobacco use   . Hyperlipidemia   . Hypertension   . OSA on CPAP    wears CPAP qHS per pt  . PAC (premature atrial contraction)    PAC verus post cabg AF on amiodarone s/p cath. Not on OAC.  . S/P CABG x 4    2/7 LIMA-LAD, SVG-diagonal, SVG-OM1 to OM2   Past Surgical History:  Procedure Laterality Date  . CARDIAC CATHETERIZATION    . CORONARY ARTERY BYPASS GRAFT N/A 09/17/2019   Procedure: CORONARY ARTERY BYPASS GRAFTING (CABG), ON PUMP, TIMES FOUR, USING LEFT INTERNAL MAMMARY ARTERY AND RIGHT GREATER SAPHENOUS VEIN HARVESTED ENDOSCOPICALLY;  Surgeon: Linden Dolin, MD;  Location: [facility] OR;  Service: Open Heart Surgery;  Laterality: N/A;  . CORONARY/GRAFT ACUTE MI REVASCULARIZATION N/A 09/16/2019   Procedure: Coronary/Graft Acute MI Revascularization;  Surgeon: Iran Ouch, MD;  Location: [facility] INVASIVE CV LAB;  Service: Cardiovascular;  Laterality: N/A;  . LEFT HEART CATH AND CORONARY  ANGIOGRAPHY N/A 09/16/2019   Procedure: LEFT HEART CATH AND CORONARY ANGIOGRAPHY;  Surgeon: Iran Ouch, MD;  Location: [facility] INVASIVE CV LAB;  Service: Cardiovascular;  Laterality: N/A;  . TEE WITHOUT CARDIOVERSION N/A 09/17/2019   Procedure: TRANSESOPHAGEAL ECHOCARDIOGRAM (TEE);  Surgeon: Linden Dolin, MD;  Location: [facility] OR;  Service: Open Heart Surgery;  Laterality: N/A;  . TONSILLECTOMY    . VASECTOMY      Allergies  Allergies  Allergen Reactions  . Lisinopril Cough    History of Present Illness    [name] is a 74 y.o. male with PMH as above. He mostly follows at the [place]. He has history of previous cardiac catheterization and mild CAD before his recent CABG; however, details unknown and records not available per EMR. He works as a Production designer, theatre/television/film at [employer].Of note, he had COVID-19 06/2018 for ~10 days but did not require hospitalization and no residual symptoms.   On 09/16/19, he was on his way to work when he started to have severe substernal chest tightness and heaviness with associated shortness of breath. He wasfound to have severe distal left main stenosis with plaque rupture, extending into the ostial LAD and occluded distal LAD, likely from embolization. He had mild to moderate pRCAs.He underwentPOBAof distal LAD.TTE showed EF 45 to 50%.He underwent CABGx4 as copied and pasted below 09/17/19(LIMA-LAD, SVG-diagonal, SVG-OM1 to OM2). Right sided EVH was from the right thigh and lower leg. Operative LVSF nl and with diffuse disease consistent with underlying DM2.  He was continued on ASA, atorvastatin, metoprolol. AF was noted on telemetry during the admission, though at discharge, no clear AFwas notedand insteadnoted werePACs.  At follow-up on 2/22 with CTS/ Dr. [name], it was noted that he was holding some of his BP medications after discharge with elevated BP in clinic. He felt weak with poor appetite. BP 157/90, HR 94.  He was seen at [facility] and  fatigued as well.  He was closely monitoring his blood sugar and BP with SBP 102-132 and DBP 68-87.  He reported ongoing insomnia and right sided neckwhere he received hiscentralline.He was scheduled for cardiac rehab. BP sub-optimalwith patient reporting it well controlled at home.He noteed melenawith CBC unremarkable. He had diarrhea with BMET unremarkable.  He had not yet returned to work and was concerned regarding his ongoing fatigue, as he used to be able to walk for long distances.  He was last seen 3/22 and had started cardiac rehab and doing well.  He had started back at work with a reduced shift /half day work schedule of 6 hours per day and tolerating this well.  He noted increasing energy and strength. His sternal incision continued to heal well with only mild soreness. He reported improved right-sided neck pain and healing scab.  He was careful while shaving. His previously reported insomnia, dizziness with position changes, melena, and diarrhea had resolved. No further DOE with minimal activity. He was agreeable to daily weights and BP monitoring. Given his cough with ACE, we agreed to defer ARB. He reported that cardiothoracic surgery recently discontinued two of his medications but was uncertain of the names and would call the office once home to clarify this change.   He reports to clinic today and states that CTS discontinued his amiodarone and Plavix. He stated that he is no longer taking amiodarone but has ~10 pills remaining of his Plavix and was going to discontinue it once he was finished with the bottle, as instructed. He is agreeable to continue his Plavix until we can reach out to CTS and confirm this recommendation for Plavix. He reports fatigue at times. He reports that he is now working a full schedule at [employer] and has noted that he tires toward the end of the day; however, he does note this resolves with sleep. He states that he does not sleep for long but that  this is his baseline (sleeps 4-5 hours nightly). He admits that his job has been somewhat stressful lately. He denies any recent CP, SOB, DOE, racing HR, or palpitations. He is still working with cardiac rehab and reports that he is doing well and glad to have the regimen. He does not have a scale and has not been weighing himself at home. He reports home BP that is lower than that obtained in office today and wonders to what extent his walk into the office may be playing a role in his elevated pressures. He denies any orthopnea or PND and reports CPAP compliance. Per clinic weights, he has gained some weight. No reported increase in LEE beyond that of his usual that he states worsens throughout the day if on his feet. He denies any abdominal distention or early satiety. His sternal incision is still somewhat TTP and sore. He denies any further R sided neck pain with healed incision noted on today's exam and without signs of infection or erythema. No presyncope or syncope. No s/sx consistent with bleeding. He reports medication compliance. He is attempting to eat healthy and has  significantly reduced his intake of salt. He reports 1 cup of coffee in the AM. He does not have a regular workout routine but is currently going through cardiac rehab and staying very active at work.   Home Medications    Prior to Admission medications   Medication Sig Start Date End Date Taking? Authorizing Provider  allopurinol (ZYLOPRIM) 300 MG tablet Take 300 mg by mouth daily.    [provider]  amiodarone (PACERONE) 200 MG tablet Take 1 tablet (200 mg total) by mouth 2 (two) times daily. 09/21/19   Antony Odea, PA-C  aspirin EC 81 MG tablet Take 81 mg by mouth daily.    [provider]  atorvastatin (LIPITOR) 80 MG tablet Take 80 mg by mouth daily.    [provider]  betamethasone valerate lotion (VALISONE) 0.1 % Apply 1 application topically daily as needed for irritation.    [provider]  clopidogrel (PLAVIX) 75 MG tablet Take 1 tablet (75 mg total) by mouth daily. 09/22/19   Antony Odea, PA-C  colchicine 0.6 MG tablet Take 1 tablet (0.6 mg total) by mouth daily. 09/22/19   Antony Odea, PA-C  cyclobenzaprine (FLEXERIL) 5 MG tablet Take 1 tablet (5 mg total) by mouth 3 (three) times daily as needed for muscle spasms. 06/30/19   Sable Feil, PA-C  desonide (DESOWEN) 0.05 % cream Apply 1 application topically 2 (two) times daily.    [provider]  diclofenac Sodium (VOLTAREN) 1 % GEL Apply 4 g topically 4 (four) times daily.    [provider]  glucose blood test strip Test blood ac and hs...the patient has been instructed. 10/02/19   Wonda Olds, MD  indomethacin (INDOCIN) 50 MG capsule Take 50 mg by mouth daily as needed. Gout pain    [provider]  ketoconazole (NIZORAL) 2 % shampoo Apply 1 application topically 2 (two) times a week.    [provider]  Lancets [facility] ULTRASOFT) lancets Use as instructed...test ac and hs. 10/02/19   Wonda Olds, MD  metFORMIN (GLUCOPHAGE) 500 MG tablet Take 500 mg by mouth daily with breakfast.    [provider]  metoprolol tartrate (LOPRESSOR) 25 MG tablet Take 12.5 mg by mouth 2 (two) times daily.    [provider]  nitroGLYCERIN (NITROSTAT) 0.4 MG SL tablet Place 1 tablet (0.4 mg total) under the tongue every 5 (five) minutes as needed for chest pain. 10/13/19 01/11/20  Marrianne Mood D, PA-C    Review of Systems    He denies chest pain, palpitations, dyspnea, pnd, orthopnea, n, v, dizziness, syncope, or early satiety. He reports fatigue, weight gain, LEE that worsens throughout the day when on his feet.   All other systems reviewed and are otherwise negative except as noted above.  Physical Exam    VS:  BP (!) 142/80 (BP Location: Left Arm, Patient Position: Sitting, Cuff Size: Normal)   Pulse (!) 52   Ht 5' 6.5" (1.689 m)   Wt 212  lb (96.2 kg)   SpO2 97%   BMI 33.71 kg/m  , BMI Body mass index is 33.71 kg/m. GEN: Well nourished, well developed, in no acute distress. HEENT: normal. Neck: Supple, no JVD, carotid bruits, or masses. Cardiac: RRR, no murmurs, rubs, or gallops. R sided neck central line with pink and healthy tissue and no longer with scab present. No signs of infection. Central sternal incision with some erythema noted towards the bottom and top  of the incision and mild ecchymosis. No signs of infection. Will continue to monitor. R EVH incision sites still healing well. No clubbing, cyanosis. Bilateral moderate to 1+ LEE.  Radials/DP/PT 2+ and equal bilaterally.  Respiratory:  Respirations regular and unlabored, clear to auscultation bilaterally. GI: Soft, nontender, nondistended, BS + x 4. MS: no deformity or atrophy. Skin: warm and dry, no rash. Neuro:  Strength and sensation are intact. Psych: Normal affect.  Accessory Clinical Findings    ECG personally reviewed by me today -sinus bradycardia with first-degree block, 52bpm, previous anterolateral ischemia noted on prior tracings, lateral ischemia more pronounced when compared with previous tracing in V5 V6- no acute changes.  VITALS Reviewed today   Temp Readings from Last 3 Encounters:  10/16/19 (!) 97.3 F (36.3 C) (Temporal)  10/02/19 (!) 97.5 F (36.4 C) (Temporal)  09/21/19 98.5 F (36.9 C) (Oral)   BP Readings from Last 3 Encounters:  11/29/19 (!) 142/80  10/30/19 130/76  10/16/19 (!) 113/55   Pulse Readings from Last 3 Encounters:  11/29/19 (!) 52  10/30/19 64  10/16/19 60    Wt Readings from Last 3 Encounters:  11/29/19 212 lb (96.2 kg)  10/30/19 206 lb 8 oz (93.7 kg)  10/18/19 201 lb 14.4 oz (91.6 kg)     LABS  reviewed today   CareEverwhere Labs present and most recent? Yes/No: No  Lab Results  Component Value Date   WBC 7.6 10/13/2019   HGB 13.7 10/13/2019   HCT 42.9 10/13/2019   MCV 87 10/13/2019   PLT 337  10/13/2019   Lab Results  Component Value Date   CREATININE 1.27 (H) 10/16/2019   BUN 17 10/16/2019   NA 136 10/16/2019   K 4.3 10/16/2019   CL 104 10/16/2019   CO2 27 10/16/2019   No results found for: ALT, AST, GGT, ALKPHOS, BILITOT No results found for: CHOL, HDL, LDLCALC, LDLDIRECT, TRIG, CHOLHDL  Lab Results  Component Value Date   HGBA1C 6.6 (H) 09/21/2019   No results found for: TSH   STUDIES/PROCEDURES reviewed today   09/17/19 CABGx4  Coronary Artery Bypass Grafting x4 Left Internal Mammary Artery to Distal Left Anterior Descending Coronary Artery;Saphenous Vein Graft torightPosterolateralCoronary Artery and 1stObtuse Marginal Branch of Left Circumflex Coronary Arteryas a sequenced graft;Sapheonous Vein Graft to1stDiagonal Branch Coronary Artery;Endoscopic Vein Harvest fromrightThigh and Lower Leg;  Rigid sternal reconstruction with linear plating system Operative Findings: ? preservedleft ventricular systolic function ? goodquality left internal mammary artery conduit ? Goodquality saphenous vein conduit ? fairquality target vessels for grafting due to diffuse disease consistent with underlying diabetes  The1stdiagonal branch of the left anterior descending coronary artery was grafted using a reversed saphenous vein graft in an end-to-side fashion. At the site of distal anastomosis the target vessel was fairquality and measured approximately 1.24mm in diameter.  Therightposterolateralbranch of the right coronary artery was grafted using a reversed saphenous vein graft in an end-to-side fashion. At the site of distal anastomosis the target vessel was fairquality and measured approximately 1.46mm in diameter. Next, the1stobtuse marginal branch of the left circumflex coronary artery was grafted in an side-to-side fashionto create a sequenced graft configuration with the Right PLA. At the site of distal anastomosis the target vessel  was fairquality and measured approximately 1.21mm in diameter..  The distal left anterior coronary artery was grafted with the left internal mammary artery in an end-to-side fashion. At the site of distal anastomosis the target vessel was fairquality and measured approximately1.95mm in diameter. All proximal  vein graft anastomoses were placed directly to the ascending aorta prior to removal of the aortic cross clamp. The septal myocardial temperature rose rapidly after reperfusion of the left internal mammary artery graft. Deairing procedures were performed and the aortic cross clamp was removed  2/7 Intraoperative TEE PRE-OP FINDINGS  Left Ventricle: The left ventricle has mild-moderately reduced systolic  function, with an ejection fraction of 40-45%.  Right Ventricle: The right ventricle has normal systolic function.  Left Atrium: The left atrial appendage is well visualized and there is  evidence of thrombus present. The left atrial appendage is well visualized  and there is no evidence of thrombus present.  Right Atrium: Right atrial pressure is estimated at 10 mmHg.  Mitral Valve: The mitral valve is normal in structure. Mitral valve  regurgitation is trivial by color flow Doppler.  Tricuspid Valve: The tricuspid valve was normal in structure. Tricuspid  valve regurgitation is trivial by color flow Doppler.  Dorris Singh MD  Electronically signed by Dorris Singh MD  Signature Date/Time: 09/21/2019  2/7 US Doppler pre CABG Carotid and [place] Summary:  Right Carotid: The extracranial vessels were near-normal with only minimal  wall thickening or plaque.  Left Carotid: The extracranial vessels were near-normal with only minimal  wall thickening or plaque.  Vertebrals: Bilateral vertebral arteries demonstrate antegrade flow.  Subclavians: Not assessed.  Right Upper Extremity: Doppler waveforms remain within normal limits with  right radial compression. Doppler waveforms  decrease <50% with right ulnar  compression.  Left Upper Extremity: Doppler waveforms remain within normal limits with  left radial compression. Doppler waveforms remain within normal limits  with left ulnar compression.   Cath 09/16/19: 1. Severe distal left main stenosis with plaque rupture extending into the ostial LAD with occluded distal LAD likely due to embolization. Mild to moderate proximal RCA stenosis. Surgical targets include mid LAD, first diagonal and OM 3. 2. Normal LV systolic function and moderately elevated left ventricular end-diastolic pressure. 3. Successful balloon angioplasty to the distal LAD to establish TIMI-3 flow. Cardiac catheterization was performed via the right radial artery. 4. Successful intra-aortic balloon pump placement via the right common femoral artery. Recommendations: Given this the left main involvement, diffuse LAD disease and diabetic status, I think surgical revascularization with urgent/emergent CABG is indicated. The patient did not receive any oral antiplatelet medication other than aspirin. Continue unfractionated heparin. I discussed the case with Dr. [name].  TTE 09/16/19: 1. Left ventricular ejection fraction, by visual estimation, is 45 to  50%. The left ventricle has hyperdynamic function. There is severely  increased left ventricular hypertrophy.  2. Definity contrast agent was given IV to delineate the left ventricular  endocardial borders.  3. The left ventricle demonstrates regional wall motion abnormalities.  4. Severe akinesis of the left ventricular, apical apical segment and  inferoapical and distal anteroseptal/anteroapical walls. No apical  thrombus.  5. Left ventricular diastolic parameters are consistent with Grade I  diastolic dysfunction (impaired relaxation).  6. Global right ventricle has mildly reduced systolic function.The right  ventricular size is normal. No increase in right ventricular wall   thickness.  7. Hypokinetic right ventricular apex.  8. Left atrial size was normal.  9. Right atrial size was normal.  10. Mild mitral annular calcification.  11. The mitral valve is grossly normal. Trivial mitral valve  regurgitation.  12. The tricuspid valve is grossly normal.  13. The tricuspid valve is grossly normal. Tricuspid valve regurgitation  is trivial.  14. The aortic valve is  tricuspid. Aortic valve regurgitation is not  visualized. Mild aortic valve sclerosis without stenosis.  15. The pulmonic valve was grossly normal. Pulmonic valve regurgitation is  not visualized.  16. The interatrial septum was not well visualized.   Assessment & Plan    CAD(LIMA-LAD, SVG-diagonal, SVG-OM1 to OM2) S/p 2/6STEMI S/p 2/7 CABG x4 --No CP. He has not needed his PRN SL nitro.  --Fatigue reported. He continues with cardiac rehab and is now full time at work with stressors reported.  --Erythema and mild ecchymosis noted at sternal incision site with recommendation that patient closely monitor this and call if worsening erythema, swelling, or signs of infection.  --ContinueDAPT with ASA and Plavix. Reached out to CTS to verify instructions regarding Plavix, given patient stated that he planned to discontinue it once out of his current bottle and as instructed by CTS. We will update the patient with CTS recommendations regarding his Plavix once able. He will continue his Plavix for now with ~10 pills remaining. --Escalation of BB precluded by current HR. Addition of ARB deferred given ACE cough. Previous echoand intraoperative TEEabove discussed with EF reduced 45-50%. Also noted is RAP of and slightly volume up today. Will update echo to reassess pump function. Will also start chlorthalidone 12.5mg  qd for extra BP support and given slight volume increase as below. Ongoingrisk factor modification with statin. Continue lifestyle changes.   HFrEF(EF 45-50%) --No SOBat rest.  Slightly volume up on exam with increased wt and elevated pressure. TTE/TEE as above with EF 45-50%. Will repeat echo to reassess.Per GDMT with comorbid HTN/DM2, would start on ACE/ARB; however, this has been deferred given ACE cough. Will defer escalation of BB given bradycardia.  Start chlorthalidone 12.5mg  daily and reassess volume status and BP at RTC. Obtain BMET today and in 1 week.   HTN --BP elevated at 142/80. Start chlorthalidone 12.5mg  daily. He will monitor home BP at night, given his schedule, and approximately 2 hours after taking his medications at night.  HLD --Continue statin. LDL goal below 70. Will recheck lipids and liver function.  ?PAFversus PACs --NSR today. CTS discontinued amiodarone. No sx concerning for arrhythmia. No indication for Zio at this time.   Sternal incision TTP --Continue to monitor. TTP with trace ecchymosis and erythema noted at incision site.   DM2 --Most recent A1C as above.Continue metformin.Monitoring per PCP. ARB deferred 2/2 ACE cough.     Medication changes: Chlorthalidone 12.5mg  daily Labs ordered: BMET (now and in 1 week) Studies / Imaging ordered: Echo (3 months after CABG) Future considerations: Recommendations regarding continuing Plavix (pending CTS recommendations). Disposition: RTC 1 month.    Lennon Alstrom, PA-C 11/29/2019

## 2019-11-29 NOTE — Progress Notes (Signed)
Cardiac Individual Treatment Plan  Patient Details  Name: Brian Chapman MRN: [id number] Date of Birth: [birth year] Referring Provider:     Cardiac Rehab from 10/18/2019 in [facility] Cardiac and Pulmonary Rehab  Referring Provider  Arida      Initial Encounter Date:    Cardiac Rehab from 10/18/2019 in [facility] Cardiac and Pulmonary Rehab  Date  10/18/19      Visit Diagnosis: S/P CABG x 4  Patient's Home Medications on Admission:  Current Outpatient Medications:  .  allopurinol (ZYLOPRIM) 300 MG tablet, Take 300 mg by mouth daily., Disp: , Rfl:  .  amiodarone (PACERONE) 200 MG tablet, Take 1 tablet (200 mg total) by mouth 2 (two) times daily., Disp: 30 tablet, Rfl: 1 .  aspirin EC 81 MG tablet, Take 81 mg by mouth daily., Disp: , Rfl:  .  atorvastatin (LIPITOR) 80 MG tablet, Take 80 mg by mouth daily., Disp: , Rfl:  .  betamethasone valerate lotion (VALISONE) 0.1 %, Apply 1 application topically daily as needed for irritation., Disp: , Rfl:  .  clopidogrel (PLAVIX) 75 MG tablet, Take 1 tablet (75 mg total) by mouth daily., Disp: 30 tablet, Rfl: 2 .  colchicine 0.6 MG tablet, Take 1 tablet (0.6 mg total) by mouth daily., Disp: 30 tablet, Rfl: 0 .  cyclobenzaprine (FLEXERIL) 5 MG tablet, Take 1 tablet (5 mg total) by mouth 3 (three) times daily as needed for muscle spasms., Disp: 15 tablet, Rfl: 0 .  desonide (DESOWEN) 0.05 % cream, Apply 1 application topically 2 (two) times daily., Disp: , Rfl:  .  diclofenac Sodium (VOLTAREN) 1 % GEL, Apply 4 g topically 4 (four) times daily., Disp: , Rfl:  .  glucose blood test strip, Test blood ac and hs...the patient has been instructed., Disp: 100 each, Rfl: 12 .  indomethacin (INDOCIN) 50 MG capsule, Take 50 mg by mouth daily as needed. Gout pain, Disp: , Rfl:  .  ketoconazole (NIZORAL) 2 % shampoo, Apply 1 application topically 2 (two) times a week., Disp: , Rfl:  .  Lancets (ONETOUCH ULTRASOFT) lancets, Use as instructed...test ac and hs., Disp: 100  each, Rfl: 12 .  metFORMIN (GLUCOPHAGE) 500 MG tablet, Take 500 mg by mouth daily with breakfast., Disp: , Rfl:  .  metoprolol tartrate (LOPRESSOR) 25 MG tablet, Take 12.5 mg by mouth 2 (two) times daily., Disp: , Rfl:  .  nitroGLYCERIN (NITROSTAT) 0.4 MG SL tablet, Place 1 tablet (0.4 mg total) under the tongue every 5 (five) minutes as needed for chest pain., Disp: 25 tablet, Rfl: 3  Past Medical History: Past Medical History:  Diagnosis Date  . CAD (coronary artery disease)    severe distal left main stenosis with plaque rupture, extending into the ostial LAD and occluded distal LAD, likely from embolization.  He had mild to moderate pRCA stenosis. He underwent POBA of distal LAD  . Diabetes mellitus without complication ([place])   . HFrEF (heart failure with reduced ejection fraction) (HCC)    2/6 TTE 45-50%, severe LVH, G1DD  . History of tobacco use   . Hyperlipidemia   . Hypertension   . OSA on CPAP    wears CPAP qHS per pt  . PAC (premature atrial contraction)    PAC verus post cabg AF on amiodarone s/p cath. Not on [place].  . S/P CABG x 4    2/7 LIMA-LAD, SVG-diagonal, SVG-OM1 to OM2    Tobacco Use: Social History   Tobacco Use  Smoking Status  Former Smoker  . Packs/day: 0.25  . Years: 5.00  . Pack years: 1.25  . Types: Cigarettes  . Quit date: 62  . Years since quitting: 26.3  Smokeless Tobacco Never Used    Labs: Recent Chemical engineer    Labs for ITP Cardiac and Pulmonary Rehab Latest Ref Rng & Units 09/17/2019 09/18/2019 09/18/2019 09/20/2019 09/21/2019   Hemoglobin A1c 4.8 - 5.6 % - - - 6.5(H) 6.6(H)   PHART 7.350 - 7.450 7.343(L) 7.320(L) 7.302(L) - -   PCO2ART 32.0 - 48.0 mmHg 41.2 42.0 44.7 - -   HCO3 20.0 - 28.0 mmol/L 22.4 21.3 21.7 - -   TCO2 22 - 32 mmol/L _0 - -   ACIDBASEDEF 0.0 - 2.0 mmol/L 3.0(H) 4.0(H) 4.0(H) - -   O2SAT % 95.0 95.0 92.0 - -       Exercise Target Goals: Exercise Program Goal: Individual exercise prescription set using  results from initial 6 min walk test and THRR while considering  patient's activity barriers and safety.   Exercise Prescription Goal: Initial exercise prescription builds to 30-45 minutes a day of aerobic activity, 2-3 days per week.  Home exercise guidelines will be given to patient during program as part of exercise prescription that the participant will acknowledge.   Education: Aerobic Exercise & Resistance Training: - Gives group verbal and written instruction on the various components of exercise. Focuses on aerobic and resistive training programs and the benefits of this training and how to safely progress through these programs..   Education: Exercise & Equipment Safety: - Individual verbal instruction and demonstration of equipment use and safety with use of the equipment.   Cardiac Rehab from 10/18/2019 in [facility] Cardiac and Pulmonary Rehab  Date  10/18/19  Educator  AS  Instruction Review Code  1- Verbalizes Understanding      Education: Exercise Physiology & General Exercise Guidelines: - Group verbal and written instruction with models to review the exercise physiology of the cardiovascular system and associated critical values. Provides general exercise guidelines with specific guidelines to those with heart or lung disease.    Education: Flexibility, Balance, Mind/Body Relaxation: Provides group verbal/written instruction on the benefits of flexibility and balance training, including mind/body exercise modes such as yoga, pilates and tai chi.  Demonstration and skill practice provided.   Activity Barriers & Risk Stratification:   6 Minute Walk: 6 Minute Walk    Row Name 10/18/19 1308         6 Minute Walk   Phase  Initial     Distance  1172 feet     Walk Time  6 minutes     # of Rest Breaks  0     MPH  2.22     METS  2.22     RPE  7     Perceived Dyspnea   0     VO2 Peak  7.8     Symptoms  No     Resting HR  60 bpm     Resting BP  112/60     Resting  Oxygen Saturation   100 %     Exercise Oxygen Saturation  during 6 min walk  98 %     Max Ex. HR  91 bpm     Max Ex. BP  146/64     2 Minute Post BP  120/66        Oxygen Initial Assessment:   Oxygen Re-Evaluation:   Oxygen Discharge (Final  Oxygen Re-Evaluation):   Initial Exercise Prescription: Initial Exercise Prescription - 10/18/19 1300      Date of Initial Exercise RX and Referring Provider   Date  10/18/19    Referring Provider  Arida      Treadmill   MPH  2    Grade  0    Minutes  15    METs  2.5      Recumbant Bike   Level  2    RPM  60    Minutes  15    METs  2.2      Arm Ergometer   Level  1    RPM  30    Minutes  15    METs  2.2      REL-XR   Level  2    Speed  50    Minutes  15    METs  2.2      Prescription Details   Frequency (times per week)  3    Duration  Progress to 30 minutes of continuous aerobic without signs/symptoms of physical distress      Intensity   THRR 40-80% of Max Heartrate  95-130    Ratings of Perceived Exertion  11-13    Perceived Dyspnea  0-4      Resistance Training   Training Prescription  Yes    Weight  3 lb    Reps  10-15       Perform Capillary Blood Glucose checks as needed.  Exercise Prescription Changes: Exercise Prescription Changes    Row Name 10/18/19 1300 10/24/19 1100 11/06/19 1300 11/16/19 1300 11/21/19 1300     Response to Exercise   Blood Pressure (Admit)  112/60  124/60  122/70  --  130/68   Blood Pressure (Exercise)  146/64  138/70  144/74  --  122/62   Blood Pressure (Exit)  120/66  125/58  124/70  --  106/58   Heart Rate (Admit)  60 bpm  78 bpm  66 bpm  --  40 bpm   Heart Rate (Exercise)  91 bpm  93 bpm  100 bpm  --  117 bpm   Heart Rate (Exit)  67 bpm  71 bpm  65 bpm  --  57 bpm   Oxygen Saturation (Admit)  100 %  --  --  --  --   Oxygen Saturation (Exercise)  98 %  --  --  --  --   Rating of Perceived Exertion (Exercise)  _0 --  12   Perceived Dyspnea (Exercise)  0  --  --   --  --   Symptoms  none  none  none  --  none   Duration  --  Continue with 30 min of aerobic exercise without signs/symptoms of physical distress.  Continue with 30 min of aerobic exercise without signs/symptoms of physical distress.  --  Continue with 30 min of aerobic exercise without signs/symptoms of physical distress.   Intensity  --  THRR unchanged  THRR unchanged  --  THRR unchanged     Progression   Progression  --  --  Continue to progress workloads to maintain intensity without signs/symptoms of physical distress.  --  Continue to progress workloads to maintain intensity without signs/symptoms of physical distress.   Average METs  --  --  2.49  --  2.6     Resistance Training   Training Prescription  --  Yes  Yes  --  Yes   Weight  --  3 lb  3 lb  --  4 lb   Reps  --  10-15  10-15  --  10-15     Interval Training   Interval Training  --  No  No  --  No     Treadmill   MPH  --  2  2  --  1.8   Grade  --  0  0.5  --  0   Minutes  --  15  15  --  15   METs  --  2.5  2.67  --  2.38     Recumbant Bike   Level  --  --  2  --  --   Minutes  --  --  15  --  --     Arm Ergometer   Level  --  --  1  --  --   Minutes  --  --  15  --  --   METs  --  --  2.3  --  --     Elliptical   Level  --  1  --  --  --   Minutes  --  15  --  --  --     REL-XR   Level  --  --  --  --  2   Speed  --  --  --  --  50   Minutes  --  --  --  --  15   METs  --  --  --  --  2.8     Home Exercise Plan   Plans to continue exercise at  --  --  --  [employer] (comment)  Forensic scientist (comment)   Frequency  --  --  --  Add 2 additional days to program exercise sessions.  Add 2 additional days to program exercise sessions.   Initial Home Exercises Provided  --  --  --  11/16/19  11/16/19      Exercise Comments: Exercise Comments    Row Name 10/24/19 0831 11/21/19 0802         Exercise Comments  First full day of exercise!  Patient was oriented to gym and equipment including  functions, settings, policies, and procedures.  Patient's individual exercise prescription and treatment plan were reviewed.  All starting workloads were established based on the results of the 6 minute walk test done at initial orientation visit.  The plan for exercise progression was also introduced and progression will be customized based on patient's performance and goals.  Brian Chapman arrived to tell staff he was feeling very fatigued. He received his COVID Vaccine yesterday.  Sent him home to rest, no session today         Exercise Goals and Review: Exercise Goals    Row Name 10/18/19 1317             Exercise Goals   Increase Physical Activity  Yes       Intervention  Provide advice, education, support and counseling about physical activity/exercise needs.;Develop an individualized exercise prescription for aerobic and resistive training based on initial evaluation findings, risk stratification, comorbidities and participant's personal goals.       Expected Outcomes  Short Term: Attend rehab on a regular basis to increase amount of physical activity.;Long Term: Add in home exercise to make exercise part of routine and to increase amount of physical activity.;Long Term: Exercising regularly at least 3-5 days a week.  Increase Strength and Stamina  Yes       Intervention  Provide advice, education, support and counseling about physical activity/exercise needs.;Develop an individualized exercise prescription for aerobic and resistive training based on initial evaluation findings, risk stratification, comorbidities and participant's personal goals.       Expected Outcomes  Short Term: Increase workloads from initial exercise prescription for resistance, speed, and METs.;Short Term: Perform resistance training exercises routinely during rehab and add in resistance training at home;Long Term: Improve cardiorespiratory fitness, muscular endurance and strength as measured by increased METs and  functional capacity (6MWT)       Able to understand and use rate of perceived exertion (RPE) scale  Yes       Intervention  Provide education and explanation on how to use RPE scale       Expected Outcomes  Short Term: Able to use RPE daily in rehab to express subjective intensity level;Long Term:  Able to use RPE to guide intensity level when exercising independently       Able to understand and use Dyspnea scale  Yes       Intervention  Provide education and explanation on how to use Dyspnea scale       Expected Outcomes  Short Term: Able to use Dyspnea scale daily in rehab to express subjective sense of shortness of breath during exertion;Long Term: Able to use Dyspnea scale to guide intensity level when exercising independently       Knowledge and understanding of Target Heart Rate Range (THRR)  Yes       Intervention  Provide education and explanation of THRR including how the numbers were predicted and where they are located for reference       Expected Outcomes  Short Term: Able to state/look up THRR;Short Term: Able to use daily as guideline for intensity in rehab;Long Term: Able to use THRR to govern intensity when exercising independently       Able to check pulse independently  Yes       Intervention  Provide education and demonstration on how to check pulse in carotid and radial arteries.;Review the importance of being able to check your own pulse for safety during independent exercise       Expected Outcomes  Short Term: Able to explain why pulse checking is important during independent exercise;Long Term: Able to check pulse independently and accurately       Understanding of Exercise Prescription  Yes       Intervention  Provide education, explanation, and written materials on patient's individual exercise prescription       Expected Outcomes  Short Term: Able to explain program exercise prescription;Long Term: Able to explain home exercise prescription to exercise independently           Exercise Goals Re-Evaluation : Exercise Goals Re-Evaluation    Row Name 10/24/19 0831 11/06/19 1343 11/16/19 1309 11/21/19 1342       Exercise Goal Re-Evaluation   Exercise Goals Review  Able to understand and use rate of perceived exertion (RPE) scale;Knowledge and understanding of Target Heart Rate Range (THRR);Understanding of Exercise Prescription  Increase Physical Activity;Increase Strength and Stamina;Understanding of Exercise Prescription  Increase Physical Activity;Increase Strength and Stamina;Able to understand and use rate of perceived exertion (RPE) scale;Able to understand and use Dyspnea scale;Knowledge and understanding of Target Heart Rate Range (THRR);Able to check pulse independently;Understanding of Exercise Prescription  Increase Physical Activity;Increase Strength and Stamina;Able to understand and use rate of perceived exertion (RPE) scale;Able  to understand and use Dyspnea scale;Knowledge and understanding of Target Heart Rate Range (THRR);Able to check pulse independently;Understanding of Exercise Prescription    Comments  Reviewed RPE scale, THR and program prescription with pt today.  Pt voiced understanding and was given a copy of goals to take home.  Brian Chapman is off to a good start in rehab.  He has completed his first four full days of exercise.  We will continue to monitor his progress.  eviewed home exercise with pt today.  Pt plans to go to [employer] for exercise.  Reviewed THR, pulse, RPE, sign and symptoms, NTG use, and when to call 911 or MD.  Also discussed weather considerations and indoor options.  Pt voiced understanding.  Brian Chapman has moved up to 4 lb for strength work.  He has a Higher education careers adviser at [employer] and plans to exercise there on weekends.    Expected Outcomes  Short: Use RPE daily to regulate intensity. Long: Follow program prescription in THR.  Short: Continue to attend regularly  Long: Continue to follow program prescription.  Short : exercsie 1-2  days outside HT LOng: maintain exercise on his own  Short: add in exercise on weekends Long: increase stamina       Discharge Exercise Prescription (Final Exercise Prescription Changes): Exercise Prescription Changes - 11/21/19 1300      Response to Exercise   Blood Pressure (Admit)  130/68    Blood Pressure (Exercise)  122/62    Blood Pressure (Exit)  106/58    Heart Rate (Admit)  40 bpm    Heart Rate (Exercise)  117 bpm    Heart Rate (Exit)  57 bpm    Rating of Perceived Exertion (Exercise)  12    Symptoms  none    Duration  Continue with 30 min of aerobic exercise without signs/symptoms of physical distress.    Intensity  THRR unchanged      Progression   Progression  Continue to progress workloads to maintain intensity without signs/symptoms of physical distress.    Average METs  2.6      Resistance Training   Training Prescription  Yes    Weight  4 lb    Reps  10-15      Interval Training   Interval Training  No      Treadmill   MPH  1.8    Grade  0    Minutes  15    METs  2.38      REL-XR   Level  2    Speed  50    Minutes  15    METs  2.8      Home Exercise Plan   Plans to continue exercise at  [employer] (comment)    Frequency  Add 2 additional days to program exercise sessions.    Initial Home Exercises Provided  11/16/19       Nutrition:  Target Goals: Understanding of nutrition guidelines, daily intake of sodium <1589m, cholesterol <2076m calories 30% from fat and 7% or less from saturated fats, daily to have 5 or more servings of fruits and vegetables.  Education: Controlling Sodium/Reading Food Labels -Group verbal and written material supporting the discussion of sodium use in heart healthy nutrition. Review and explanation with models, verbal and written materials for utilization of the food label.   Education: General Nutrition Guidelines/Fats and Fiber: -Group instruction provided by verbal, written material, models and posters to  present the general guidelines for heart healthy nutrition. Gives  an explanation and review of dietary fats and fiber.   Biometrics: Pre Biometrics - 10/18/19 1318      Pre Biometrics   Height  _0  (1.676 m)    Weight  201 lb 14.4 oz (91.6 kg)    BMI (Calculated)  32.6        Nutrition Therapy Plan and Nutrition Goals: Nutrition Therapy & Goals - 11/20/19 0701      Nutrition Therapy   Diet  HH, Low Na    Protein (specify units)  75-80g    Fiber  30 grams    Whole Grain Foods  3 servings    Saturated Fats  12 max. grams    Fruits and Vegetables  5 servings/day    Sodium  1.5 grams      Personal Nutrition Goals   Nutrition Goal  ST: continue current diet LT: enhance longevity    Comments  awake 330-430am B: coffee, toast or english muffin with butter, jelly, or peanut butter B2: coffee (sometimes) Lor S: sandwich ([place] on wrap) or some peanut butter crackers D: wife cooks or leftovers. Meat, vegetables. Pt reports weight stable for 30 years. Pt reports for snacks will also have trail mix and fruit. Pt reports having a variety of vegetables and using HH methods of cooking. Suggested whole grains for breads and wraps. Discussed HH eating. Pt reports eating intuitively.      Intervention Plan   Intervention  Prescribe, educate and counsel regarding individualized specific dietary modifications aiming towards targeted core components such as weight, hypertension, lipid management, diabetes, heart failure and other comorbidities.;Nutrition handout(s) given to patient.    Expected Outcomes  Short Term Goal: Understand basic principles of dietary content, such as calories, fat, sodium, cholesterol and nutrients.;Short Term Goal: A plan has been developed with personal nutrition goals set during dietitian appointment.;Long Term Goal: Adherence to prescribed nutrition plan.       Nutrition Assessments: Nutrition Assessments - 10/19/19 1124      MEDFICTS Scores   Pre Score  99        MEDIFICTS Score Key:          ?70 Need to make dietary changes          40-70 Heart Healthy Diet         ? 40 Therapeutic Level Cholesterol Diet  Nutrition Goals Re-Evaluation:   Nutrition Goals Discharge (Final Nutrition Goals Re-Evaluation):   Psychosocial: Target Goals: Acknowledge presence or absence of significant depression and/or stress, maximize coping skills, provide positive support system. Participant is able to verbalize types and ability to use techniques and skills needed for reducing stress and depression.   Education: Depression - Provides group verbal and written instruction on the correlation between heart/lung disease and depressed mood, treatment options, and the stigmas associated with seeking treatment.   Education: Sleep Hygiene -Provides group verbal and written instruction about how sleep can affect your health.  Define sleep hygiene, discuss sleep cycles and impact of sleep habits. Review good sleep hygiene tips.     Education: Stress and Anxiety: - Provides group verbal and written instruction about the health risks of elevated stress and causes of high stress.  Discuss the correlation between heart/lung disease and anxiety and treatment options. Review healthy ways to manage with stress and anxiety.    Initial Review & Psychosocial Screening: Initial Psych Review & Screening - 10/17/19 1311      Initial Review   Current issues with  Current Sleep Concerns  Family Dynamics   Good Support System?  Yes    Comments  His incision is still tender which is making it hard to sleep. He can look to his wife for support.      Barriers   Psychosocial barriers to participate in program  There are no identifiable barriers or psychosocial needs.      Screening Interventions   Interventions  Provide feedback about the scores to participant;Encouraged to exercise;Program counselor consult;To provide support and resources with identified psychosocial  needs    Expected Outcomes  Short Term goal: Utilizing psychosocial counselor, staff and physician to assist with identification of specific Stressors or current issues interfering with healing process. Setting desired goal for each stressor or current issue identified.;Long Term Goal: Stressors or current issues are controlled or eliminated.;Short Term goal: Identification and review with participant of any Quality of Life or Depression concerns found by scoring the questionnaire.;Long Term goal: The participant improves quality of Life and PHQ9 Scores as seen by post scores and/or verbalization of changes       Quality of Life Scores:  Quality of Life - 10/18/19 1320      Quality of Life   Select  Quality of Life      Quality of Life Scores   Health/Function Pre  28.8 %    Socioeconomic Pre  29.14 %    Psych/Spiritual Post  30 %    Family Pre  30 %      Scores of 19 and below usually indicate a poorer quality of life in these areas.  A difference of  2-3 points is a clinically meaningful difference.  A difference of 2-3 points in the total score of the Quality of Life Index has been associated with significant improvement in overall quality of life, self-image, physical symptoms, and general health in studies assessing change in quality of life.  PHQ-9: Recent Review Flowsheet Data    Depression screen [facility] 2/9 11/28/2019 10/18/2019   Decreased Interest 0 1   Down, Depressed, Hopeless 0 1   PHQ - 2 Score 0 2   Altered sleeping 1 1   Tired, decreased energy 1 1   Change in appetite 0 1   Feeling bad or failure about yourself  0 0   Trouble concentrating 0 1   Moving slowly or fidgety/restless 0 1   Suicidal thoughts 0 0   PHQ-9 Score 2 7   Difficult doing work/chores Not difficult at all  Somewhat difficult     Interpretation of Total Score  Total Score Depression Severity:  1-4 = Minimal depression, 5-9 = Mild depression, 10-14 = Moderate depression, 15-19 = Moderately severe  depression, 20-27 = Severe depression   Psychosocial Evaluation and Intervention: Psychosocial Evaluation - 10/17/19 1312      Psychosocial Evaluation & Interventions   Interventions  Encouraged to exercise with the program and follow exercise prescription    Comments  His incision is still tender which is making it hard to sleep. He can look to his wife for support.    Expected Outcomes  Short: Attend [facility] stress management education to decrease stress. Long: Maintain exercise Post [facility] to keep stress at a minimum.    Continue Psychosocial Services   Follow up required by staff       Psychosocial Re-Evaluation: Psychosocial Re-Evaluation    Bennett Name 11/23/19 [phone]             Psychosocial Re-Evaluation   Current issues with  Current Sleep  Concerns;Current Stress Concerns       Comments  His incision is feeling better so he has been sleeping better.  He feels everything is moving in the right direction.       Expected Outcomes  Short: continue good sleep patterns Long: maintain positive outlook          Psychosocial Discharge (Final Psychosocial Re-Evaluation): Psychosocial Re-Evaluation - 11/23/19 0752      Psychosocial Re-Evaluation   Current issues with  Current Sleep Concerns;Current Stress Concerns    Comments  His incision is feeling better so he has been sleeping better.  He feels everything is moving in the right direction.    Expected Outcomes  Short: continue good sleep patterns Long: maintain positive outlook       Vocational Rehabilitation: Provide vocational rehab assistance to qualifying candidates.   Vocational Rehab Evaluation & Intervention:   Education: Education Goals: Education classes will be provided on a variety of topics geared toward better understanding of heart health and risk factor modification. Participant will state understanding/return demonstration of topics presented as noted by education test scores.  Learning  Barriers/Preferences: Learning Barriers/Preferences - 10/17/19 1313      Learning Barriers/Preferences   Learning Barriers  None    Learning Preferences  None       General Cardiac Education Topics:  AED/CPR: - Group verbal and written instruction with the use of models to demonstrate the basic use of the AED with the basic ABC's of resuscitation.   Anatomy & Physiology of the Heart: - Group verbal and written instruction and models provide basic cardiac anatomy and physiology, with the coronary electrical and arterial systems. Review of Valvular disease and Heart Failure   Cardiac Procedures: - Group verbal and written instruction to review commonly prescribed medications for heart disease. Reviews the medication, class of the drug, and side effects. Includes the steps to properly store meds and maintain the prescription regimen. (beta blockers and nitrates)   Cardiac Medications I: - Group verbal and written instruction to review commonly prescribed medications for heart disease. Reviews the medication, class of the drug, and side effects. Includes the steps to properly store meds and maintain the prescription regimen.   Cardiac Medications II: -Group verbal and written instruction to review commonly prescribed medications for heart disease. Reviews the medication, class of the drug, and side effects. (all other drug classes)    Go Sex-Intimacy & Heart Disease, Get SMART - Goal Setting: - Group verbal and written instruction through game format to discuss heart disease and the return to sexual intimacy. Provides group verbal and written material to discuss and apply goal setting through the application of the S.M.A.R.T. Method.   Other Matters of the Heart: - Provides group verbal, written materials and models to describe Stable Angina and Peripheral Artery. Includes description of the disease process and treatment options available to the cardiac patient.   Infection  Prevention: - Provides verbal and written material to individual with discussion of infection control including proper hand washing and proper equipment cleaning during exercise session.   Cardiac Rehab from 10/18/2019 in [facility] Cardiac and Pulmonary Rehab  Date  10/18/19  Educator  AS  Instruction Review Code  1- Verbalizes Understanding      Falls Prevention: - Provides verbal and written material to individual with discussion of falls prevention and safety.   Cardiac Rehab from 10/18/2019 in [facility] Cardiac and Pulmonary Rehab  Date  10/18/19  Educator  AS  Instruction Review Code  1- Verbalizes Understanding      Other: -Provides group and verbal instruction on various topics (see comments)   Knowledge Questionnaire Score: Knowledge Questionnaire Score - 10/18/19 1320      Knowledge Questionnaire Score   Pre Score  24/26       Core Components/Risk Factors/Patient Goals at Admission: Personal Goals and Risk Factors at Admission - 10/18/19 1319      Core Components/Risk Factors/Patient Goals on Admission    Weight Management  Yes;Weight Maintenance;Weight Loss    Admit Weight  201 lb 14.4 oz (91.6 kg)    Goal Weight: Short Term  190 lb (86.2 kg)    Goal Weight: Long Term  180 lb (81.6 kg)    Expected Outcomes  Short Term: Continue to assess and modify interventions until short term weight is achieved;Long Term: Adherence to nutrition and physical activity/exercise program aimed toward attainment of established weight goal;Weight Loss: Understanding of general recommendations for a balanced deficit meal plan, which promotes 1-2 lb weight loss per week and includes a negative energy balance of [phone] kcal/d;Understanding recommendations for meals to include 15-35% energy as protein, 25-35% energy from fat, 35-60% energy from carbohydrates, less than 242m of dietary cholesterol, 20-35 gm of total fiber daily;Understanding of distribution of calorie intake throughout the day with the  consumption of 4-5 meals/snacks    Diabetes  Yes    Intervention  Provide education about signs/symptoms and action to take for hypo/hyperglycemia.;Provide education about proper nutrition, including hydration, and aerobic/resistive exercise prescription along with prescribed medications to achieve blood glucose in normal ranges: Fasting glucose 65-99 mg/dL    Expected Outcomes  Short Term: Participant verbalizes understanding of the signs/symptoms and immediate care of hyper/hypoglycemia, proper foot care and importance of medication, aerobic/resistive exercise and nutrition plan for blood glucose control.;Long Term: Attainment of HbA1C < 7%.       Education:Diabetes - Individual verbal and written instruction to review signs/symptoms of diabetes, desired ranges of glucose level fasting, after meals and with exercise. Acknowledge that pre and post exercise glucose checks will be done for 3 sessions at entry of program.   [facility]  Date  10/18/19  Educator  AS  Instruction Review Code  1- Verbalizes Understanding      Education: Know Your Numbers and Risk Factors: -Group verbal and written instruction about important numbers in your health.  Discussion of what are risk factors and how they play a role in the disease process.  Review of Cholesterol, Blood Pressure, Diabetes, and BMI and the role they play in your overall health.   Core Components/Risk Factors/Patient Goals Review:  Goals and Risk Factor Review    Row Name 11/23/19 0748             Core Components/Risk Factors/Patient Goals Review   Personal Goals Review  Weight Management/Obesity;Hypertension;Lipids;Diabetes       Review  DShanon Browis taking all meds as directed.  He takes his BP a couple times per week.  He doesn't regularly check BG at home.  He feels more limbered up by coming to exercise.  He likes the routine of the program.       Expected Outcomes  Short : check  with Dr about how often to monitor BG Long:  manage risk factors          Core Components/Risk Factors/Patient Goals at Discharge (Final Review):  Goals and Risk Factor Review - 11/23/19 08527  Core Components/Risk Factors/Patient Goals Review   Personal Goals Review  Weight Management/Obesity;Hypertension;Lipids;Diabetes    Review  Brian Chapman is taking all meds as directed.  He takes his BP a couple times per week.  He doesn't regularly check BG at home.  He feels more limbered up by coming to exercise.  He likes the routine of the program.    Expected Outcomes  Short : check with Dr about how often to monitor BG Long:  manage risk factors       ITP Comments: ITP Comments    Row Name 10/17/19 1310 10/24/19 0831 11/01/19 0608 11/20/19 0717 11/21/19 0757   ITP Comments  Virtual Orientation performed. Patient informed when to come in for RD and EP orientation. Diagnosis can be found in [facility] 09/17/2019  First full day of exercise!  Patient was oriented to gym and equipment including functions, settings, policies, and procedures.  Patient's individual exercise prescription and treatment plan were reviewed.  All starting workloads were established based on the results of the 6 minute walk test done at initial orientation visit.  The plan for exercise progression was also introduced and progression will be customized based on patient's performance and goals.  30 day chart review completed. ITP sent to Dr Zachery Dakins Medical Director, for review,changes as needed and signature. Continue with ITP if no changes requested  Completed Initial RD Alberteen Spindle arrived to tell staff he was feeling very fatigued. He received his COVID Vaccine yesterday.  Sent him home to rest, no session today   [place] Name 11/29/19 0540           ITP Comments  30 Day review completed. Medical Director review done, changes made as directed,and approval shown by signature of Market researcher.          Comments:

## 2019-11-30 ENCOUNTER — Other Ambulatory Visit: Payer: Self-pay | Admitting: *Deleted

## 2019-11-30 ENCOUNTER — Encounter: Payer: No Typology Code available for payment source | Admitting: *Deleted

## 2019-11-30 DIAGNOSIS — Z951 Presence of aortocoronary bypass graft: Secondary | ICD-10-CM

## 2019-11-30 LAB — BASIC METABOLIC PANEL
BUN/Creatinine Ratio: 14 (ref 10–24)
BUN: 16 mg/dL (ref 8–27)
CO2: 23 mmol/L (ref 20–29)
Calcium: 9.3 mg/dL (ref 8.6–10.2)
Chloride: 104 mmol/L (ref 96–106)
Creatinine, Ser: 1.13 mg/dL (ref 0.76–1.27)
GFR calc Af Amer: 74 mL/min/{1.73_m2} (ref >59)
GFR calc non Af Amer: 64 mL/min/{1.73_m2} (ref >59)
Glucose: 93 mg/dL (ref 65–99)
Potassium: 4.4 mmol/L (ref 3.5–5.2)
Sodium: 142 mmol/L (ref 134–144)

## 2019-11-30 LAB — HEPATIC FUNCTION PANEL
ALT: 18 IU/L (ref 0–44)
AST: 21 IU/L (ref 0–40)
Albumin: 4.1 g/dL (ref 3.7–4.7)
Alkaline Phosphatase: 95 IU/L (ref 39–117)
Bilirubin Total: 0.3 mg/dL (ref 0.0–1.2)
Bilirubin, Direct: 0.1 mg/dL (ref 0.00–0.40)
Total Protein: 6.7 g/dL (ref 6.0–8.5)

## 2019-11-30 LAB — LIPID PANEL
Chol/HDL Ratio: 2.8 ratio (ref 0.0–5.0)
Cholesterol, Total: 94 mg/dL — ABNORMAL LOW (ref 100–199)
HDL: 34 mg/dL — ABNORMAL LOW (ref >39)
LDL Chol Calc (NIH): 40 mg/dL (ref 0–99)
Triglycerides: 109 mg/dL (ref 0–149)
VLDL Cholesterol Cal: 20 mg/dL (ref 5–40)

## 2019-11-30 NOTE — Telephone Encounter (Signed)
-----   Message from Lennon Alstrom, PA-C sent at 11/30/2019  3:21 PM EDT ----- Regarding: Patient call Hi there,  Can we call this patient and notify him that I have contacted cardiothoracic surgery and am waiting to hear back regarding his Plavix and whether he should continue or discontinue it after he runs out of his current prescription.   Can we confirm that he still has some of the medication remaining (he estimated having 10 pills left as of yesterday 4/21).   We will call him as soon as I talk with cardiothoracic surgery and can verify if he should continue or discontinue the medication; however, for now, he should still continue it. I just want to ensure that he knows we are working on an answer for him.  Thank you! JV

## 2019-11-30 NOTE — Progress Notes (Signed)
Daily Session Note  Patient Details  Name: Brian Chapman MRN: [id number] Date of Birth: Apr 02, 1946 Referring Provider:     Cardiac Rehab from 10/18/2019 in [facility] Cardiac and Pulmonary Rehab  Referring Provider  Arida      Encounter Date: 11/30/2019  Check In:      Social History   Tobacco Use  Smoking Status Former Smoker  . Packs/day: 0.25  . Years: 5.00  . Pack years: 1.25  . Types: Cigarettes  . Quit date: 44  . Years since quitting: 26.3  Smokeless Tobacco Never Used    Goals Met:  Independence with exercise equipment Exercise tolerated well No report of cardiac concerns or symptoms  Goals Unmet:  Not Applicable  Comments: Pt able to follow exercise prescription today without complaint.  Will continue to monitor for progression.    Dr. Emily Filbert is Medical Director for [place] and [name].

## 2019-11-30 NOTE — Telephone Encounter (Signed)
I called and spoke with the patient. I have advised him of Jacquelyn's message as stated below. The patient voices understanding and is agreeable. He was very grateful for the call back.   I did confirm with him he has roughly 10 tablets of Plavix left, although he states he has not counted these out.   To [place], [place] as an [place].

## 2019-12-01 ENCOUNTER — Telehealth: Payer: Self-pay

## 2019-12-01 ENCOUNTER — Telehealth: Payer: Self-pay | Admitting: Physician Assistant

## 2019-12-01 DIAGNOSIS — I10 Essential (primary) hypertension: Secondary | ICD-10-CM

## 2019-12-01 MED ORDER — CLOPIDOGREL BISULFATE 75 MG PO TABS: 75.0000 mg | ORAL_TABLET | Freq: Every day | ORAL | 3 refills | Status: DC

## 2019-12-01 NOTE — Telephone Encounter (Signed)
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 Refill sent in.

## 2019-12-01 NOTE — Telephone Encounter (Signed)
Called to give the patient Brian Chapman, [place] update regarding the continuation of Plavix. lmtcb.

## 2019-12-01 NOTE — Telephone Encounter (Signed)
-----   Message from Lennon Alstrom, PA-C sent at 12/01/2019  8:45 AM EDT ----- Regarding: Plavix Please call Brian Chapman and let him know to continue his Plavix 75mg  daily.  I spoke with cardiothoracic surgery today and clarified he should continue Plavix and ASA for at least 1 year from his CABG.   Can we provide enough refills to at least get him through a year? He stated he was out of refills at his last visit.   Thank you so much. Let me know if any questions at all.  Signed, , PA-C 12/01/2019, 8:47 AM

## 2019-12-01 NOTE — Telephone Encounter (Signed)
Patient is calling back to get lab results 

## 2019-12-01 NOTE — Telephone Encounter (Signed)
-----   Message from Lennon Alstrom, PA-C sent at 11/30/2019 11:39 PM EDT ----- Very good news!   Please let Brian Chapman know that his recent labs showed total cholesterol of 94 and LDL (bad cholesterol) at 40, which is at goal of LDL <70. His liver function is stable. No medication changes needed. Very reassuring results!  His renal function is stable. How is he tolerating the chlorthalidone? Please ensure he has a follow-up BMET ordered to recheck his renal function in 1 week. If these labs are stable, no further labs needed!

## 2019-12-01 NOTE — Telephone Encounter (Addendum)
Patient made aware of lab results and Marisue Ivan, PA recommendation. Patient sts that he just received the call from his pharmacy that he medication is ready, he is picking up the starting Chlorthalidone today.  Patient will [facility]  Patient voiced appreciation for the call.

## 2019-12-01 NOTE — Telephone Encounter (Signed)
error 

## 2019-12-05 ENCOUNTER — Other Ambulatory Visit: Payer: Self-pay

## 2019-12-05 ENCOUNTER — Encounter: Payer: No Typology Code available for payment source | Admitting: *Deleted

## 2019-12-05 DIAGNOSIS — Z951 Presence of aortocoronary bypass graft: Secondary | ICD-10-CM

## 2019-12-05 NOTE — Progress Notes (Signed)
Daily Session Note  Patient Details  Name: Brian Chapman MRN: [id number] Date of Birth: [birth year] Referring Provider:     Cardiac Rehab from 10/18/2019 in [facility] Cardiac and Pulmonary Rehab  Referring Provider  Brian Chapman      Encounter Date: 12/05/2019  Check In: Session Check In - 12/05/19 [name]      Check-In   Supervising physician immediately available to respond to emergencies  See telemetry face sheet for immediately available ER MD    Location  [facility]-Cardiac & Pulmonary Rehab    Staff Present  Brian Chapman, BA, ACSM CEP, Exercise Physiologist;Brian Chapman;Brian Lark, RN, BSN, CCRP    Virtual Visit  No    Medication changes reported      No    Fall or balance concerns reported     No    Warm-up and Cool-down  Performed on first and last piece of equipment    Resistance Training Performed  Yes    VAD Patient?  No    PAD/SET Patient?  No      Pain Assessment   Currently in Pain?  No/denies          Social History   Tobacco Use  Smoking Status Former Smoker  . Packs/day: 0.25  . Years: 5.00  . Pack years: 1.25  . Types: Cigarettes  . Quit date: 53  . Years since quitting: 26.3  Smokeless Tobacco Never Used    Goals Met:  Independence with exercise equipment Exercise tolerated well No report of cardiac concerns or symptoms  Goals Unmet:  Not Applicable  Comments: Pt able to follow exercise prescription today without complaint.  Will continue to monitor for progression.    Dr. Emily Chapman is Medical Director for [name] and [name].

## 2019-12-07 ENCOUNTER — Encounter: Payer: No Typology Code available for payment source | Admitting: *Deleted

## 2019-12-07 ENCOUNTER — Other Ambulatory Visit: Payer: Self-pay

## 2019-12-07 DIAGNOSIS — Z951 Presence of aortocoronary bypass graft: Secondary | ICD-10-CM | POA: Diagnosis not present

## 2019-12-07 NOTE — Progress Notes (Signed)
Daily Session Note  Patient Details  Name: MARQUE RADEMAKER MRN: [id number] Date of Birth: 1946-03-19 Referring Provider:     Cardiac Rehab from 10/18/2019 in [facility] Cardiac and Pulmonary Rehab  Referring Provider  [name]      Encounter Date: 12/07/2019  Check In: Session Check In - 12/07/19 0834      Check-In   Supervising physician immediately available to respond to emergencies  See telemetry face sheet for immediately available ER MD    Location  [facility]-Cardiac & Pulmonary Rehab    Staff Present  Carson Myrtle, BS, RRT, CPFT;Amanda Oletta Darter, BA, ACSM CEP, Exercise Physiologist;Tramond Slinker, RN, BSN, CCRP    Virtual Visit  No    Medication changes reported      No    Fall or balance concerns reported     No    Warm-up and Cool-down  Performed on first and last piece of equipment    Resistance Training Performed  Yes    VAD Patient?  No    PAD/SET Patient?  No      Pain Assessment   Currently in Pain?  No/denies          Social History   Tobacco Use  Smoking Status Former Smoker  . Packs/day: 0.25  . Years: 5.00  . Pack years: 1.25  . Types: Cigarettes  . Quit date: 57  . Years since quitting: 26.3  Smokeless Tobacco Never Used    Goals Met:  Independence with exercise equipment Exercise tolerated well No report of cardiac concerns or symptoms  Goals Unmet:  Not Applicable  Comments: Pt able to follow exercise prescription today without complaint.  Will continue to monitor for progression.    Dr. Emily Filbert is Medical Director for [place] and [name].

## 2019-12-11 ENCOUNTER — Other Ambulatory Visit: Payer: Self-pay

## 2019-12-11 ENCOUNTER — Other Ambulatory Visit
Admission: RE | Admit: 2019-12-11 | Discharge: 2019-12-11 | Disposition: A | Discharge status: Home / Self Care | Payer: No Typology Code available for payment source | Attending: Physician Assistant | Admitting: Physician Assistant

## 2019-12-11 DIAGNOSIS — I10 Essential (primary) hypertension: Secondary | ICD-10-CM | POA: Insufficient documentation

## 2019-12-11 LAB — BASIC METABOLIC PANEL
Anion gap: 11 (ref 5–15)
BUN: 15 mg/dL (ref 8–23)
CO2: 20 mmol/L — ABNORMAL LOW (ref 22–32)
Calcium: 8.8 mg/dL — ABNORMAL LOW (ref 8.9–10.3)
Chloride: 106 mmol/L (ref 98–111)
Creatinine, Ser: 1.15 mg/dL (ref 0.61–1.24)
GFR calc Af Amer: >60 mL/min (ref >60)
GFR calc non Af Amer: >60 mL/min (ref >60)
Glucose, Bld: 121 mg/dL — ABNORMAL HIGH (ref 70–99)
Potassium: 4.1 mmol/L (ref 3.5–5.1)
Sodium: 137 mmol/L (ref 135–145)

## 2019-12-12 ENCOUNTER — Encounter: Payer: No Typology Code available for payment source | Attending: Cardiovascular Disease | Admitting: *Deleted

## 2019-12-12 ENCOUNTER — Other Ambulatory Visit: Payer: Self-pay | Admitting: Physician Assistant

## 2019-12-12 DIAGNOSIS — Z951 Presence of aortocoronary bypass graft: Secondary | ICD-10-CM | POA: Insufficient documentation

## 2019-12-12 DIAGNOSIS — I509 Heart failure, unspecified: Secondary | ICD-10-CM

## 2019-12-12 NOTE — Progress Notes (Signed)
Daily Session Note  Patient Details  Name: Brian Chapman MRN: [id number] Date of Birth: 20-Apr-1946 Referring Provider:     Cardiac Rehab from 10/18/2019 in [facility] Cardiac and Pulmonary Rehab  Referring Provider  [name]      Encounter Date: 12/12/2019  Check In: Session Check In - 12/12/19 0815      Check-In   Supervising physician immediately available to respond to emergencies  See telemetry face sheet for immediately available ER MD    Location  [facility]-Cardiac & Pulmonary Rehab    Staff Present  Heath Lark, RN, BSN, CCRP;Joseph Foy [place], [place], ACSM CEP, Exercise Physiologist    Virtual Visit  No    Medication changes reported      No    Fall or balance concerns reported     No    Warm-up and Cool-down  Performed on first and last piece of equipment    Resistance Training Performed  Yes    VAD Patient?  No    PAD/SET Patient?  No      Pain Assessment   Currently in Pain?  No/denies          Social History   Tobacco Use  Smoking Status Former Smoker  . Packs/day: 0.25  . Years: 5.00  . Pack years: 1.25  . Types: Cigarettes  . Quit date: 47  . Years since quitting: 26.3  Smokeless Tobacco Never Used    Goals Met:  Independence with exercise equipment Exercise tolerated well No report of cardiac concerns or symptoms  Goals Unmet:  Not Applicable  Comments: Pt able to follow exercise prescription today without complaint.  Will continue to monitor for progression.    Dr. Emily Filbert is Medical Director for [place] and [name].

## 2019-12-14 ENCOUNTER — Encounter: Payer: No Typology Code available for payment source | Admitting: *Deleted

## 2019-12-14 ENCOUNTER — Other Ambulatory Visit: Payer: Self-pay

## 2019-12-14 DIAGNOSIS — Z951 Presence of aortocoronary bypass graft: Secondary | ICD-10-CM

## 2019-12-14 NOTE — Progress Notes (Signed)
Daily Session Note  Patient Details  Name: Brian Chapman MRN: [id number] Date of Birth: 1945-12-11 Referring Provider:     Cardiac Rehab from 10/18/2019 in [facility] Cardiac and Pulmonary Rehab  Referring Provider  [name]      Encounter Date: 12/14/2019  Check In: Session Check In - 12/14/19 0755      Check-In   Supervising physician immediately available to respond to emergencies  See telemetry face sheet for immediately available ER MD    Location  [facility]-Cardiac & Pulmonary Rehab    Staff Present  Nada Maclachlan, BA, ACSM CEP, Exercise Physiologist;Melissa Caiola RDN, LDN;Tyrena Gohr, RN, BSN, CCRP    Virtual Visit  No    Medication changes reported      No    Fall or balance concerns reported     No    Warm-up and Cool-down  Performed on first and last piece of equipment    Resistance Training Performed  Yes    VAD Patient?  No    PAD/SET Patient?  No      Pain Assessment   Currently in Pain?  No/denies          Social History   Tobacco Use  Smoking Status Former Smoker  . Packs/day: 0.25  . Years: 5.00  . Pack years: 1.25  . Types: Cigarettes  . Quit date: 18  . Years since quitting: 26.3  Smokeless Tobacco Never Used    Goals Met:  Independence with exercise equipment Exercise tolerated well No report of cardiac concerns or symptoms  Goals Unmet:  Not Applicable  Comments: Pt able to follow exercise prescription today without complaint.  Will continue to monitor for progression.    Dr. Emily Filbert is Medical Director for [place] and [name].

## 2019-12-18 ENCOUNTER — Ambulatory Visit (INDEPENDENT_AMBULATORY_CARE_PROVIDER_SITE_OTHER): Payer: BC Managed Care – PPO

## 2019-12-18 ENCOUNTER — Other Ambulatory Visit: Payer: Self-pay

## 2019-12-18 DIAGNOSIS — I2510 Atherosclerotic heart disease of native coronary artery without angina pectoris: Secondary | ICD-10-CM

## 2019-12-18 DIAGNOSIS — Z79899 Other long term (current) drug therapy: Secondary | ICD-10-CM | POA: Diagnosis not present

## 2019-12-18 DIAGNOSIS — I509 Heart failure, unspecified: Secondary | ICD-10-CM | POA: Diagnosis not present

## 2019-12-18 DIAGNOSIS — Z951 Presence of aortocoronary bypass graft: Secondary | ICD-10-CM | POA: Diagnosis not present

## 2019-12-19 ENCOUNTER — Encounter: Payer: No Typology Code available for payment source | Admitting: *Deleted

## 2019-12-19 DIAGNOSIS — Z951 Presence of aortocoronary bypass graft: Secondary | ICD-10-CM | POA: Diagnosis not present

## 2019-12-19 NOTE — Progress Notes (Signed)
Daily Session Note  Patient Details  Name: Brian Chapman MRN: [id number] Date of Birth: 02-16-1946 Referring Provider:     Cardiac Rehab from 10/18/2019 in [facility] Cardiac and Pulmonary Rehab  Referring Provider  [name]      Encounter Date: 12/19/2019  Check In: Session Check In - 12/19/19 0814      Check-In   Supervising physician immediately available to respond to emergencies  See telemetry face sheet for immediately available ER MD    Location  [facility]-Cardiac & Pulmonary Rehab    Staff Present  Heath Lark, RN, BSN, Lance Sell, BA, ACSM CEP, Exercise Physiologist;Joseph Hood RCP,RRT,BSRT    Virtual Visit  No    Medication changes reported      No    Fall or balance concerns reported     No    Warm-up and Cool-down  Performed on first and last piece of equipment    Resistance Training Performed  Yes    VAD Patient?  No    PAD/SET Patient?  No      Pain Assessment   Currently in Pain?  No/denies          Social History   Tobacco Use  Smoking Status Former Smoker  . Packs/day: 0.25  . Years: 5.00  . Pack years: 1.25  . Types: Cigarettes  . Quit date: 89  . Years since quitting: 26.3  Smokeless Tobacco Never Used    Goals Met:  Independence with exercise equipment Exercise tolerated well No report of cardiac concerns or symptoms  Goals Unmet:  Not Applicable  Comments: Pt able to follow exercise prescription today without complaint.  Will continue to monitor for progression.    Dr. Emily Filbert is Medical Director for [place] and [name].

## 2019-12-21 ENCOUNTER — Encounter: Payer: No Typology Code available for payment source | Admitting: *Deleted

## 2019-12-21 ENCOUNTER — Other Ambulatory Visit: Payer: Self-pay

## 2019-12-21 DIAGNOSIS — Z951 Presence of aortocoronary bypass graft: Secondary | ICD-10-CM | POA: Diagnosis not present

## 2019-12-21 NOTE — Progress Notes (Signed)
Daily Session Note  Patient Details  Name: Brian Chapman MRN: [id number] Date of Birth: May 13, 1946 Referring Provider:     Cardiac Rehab from 10/18/2019 in [facility] Cardiac and Pulmonary Rehab  Referring Provider  [name]      Encounter Date: 12/21/2019  Check In: Session Check In - 12/21/19 0828      Check-In   Supervising physician immediately available to respond to emergencies  See telemetry face sheet for immediately available ER MD    Location  [facility]-Cardiac & Pulmonary Rehab    Staff Present  Heath Lark, RN, BSN, CCRP;Melissa [place] RDN, Rowe Pavy, BA, ACSM CEP, Exercise Physiologist    Virtual Visit  No    Medication changes reported      No    Fall or balance concerns reported     No    Warm-up and Cool-down  Performed on first and last piece of equipment    Resistance Training Performed  Yes    VAD Patient?  No    PAD/SET Patient?  No      Pain Assessment   Currently in Pain?  No/denies          Social History   Tobacco Use  Smoking Status Former Smoker  . Packs/day: 0.25  . Years: 5.00  . Pack years: 1.25  . Types: Cigarettes  . Quit date: 92  . Years since quitting: 26.3  Smokeless Tobacco Never Used    Goals Met:  Independence with exercise equipment Exercise tolerated well No report of cardiac concerns or symptoms  Goals Unmet:  Not Applicable  Comments: Pt able to follow exercise prescription today without complaint.  Will continue to monitor for progression.    Dr. Emily Filbert is Medical Director for [place] and [name].

## 2019-12-25 DIAGNOSIS — G4733 Obstructive sleep apnea (adult) (pediatric): Secondary | ICD-10-CM | POA: Diagnosis not present

## 2019-12-27 ENCOUNTER — Encounter: Payer: Self-pay | Admitting: *Deleted

## 2019-12-27 DIAGNOSIS — Z951 Presence of aortocoronary bypass graft: Secondary | ICD-10-CM

## 2019-12-27 NOTE — Progress Notes (Signed)
Cardiac Individual Treatment Plan  Patient Details  Name: Brian Chapman MRN: [id number] Date of Birth: 1945/10/16 Referring Provider:     Cardiac Rehab from 10/18/2019 in [facility] Cardiac and Pulmonary Rehab  Referring Provider  Arida      Initial Encounter Date:    Cardiac Rehab from 10/18/2019 in [facility] Cardiac and Pulmonary Rehab  Date  10/18/19      Visit Diagnosis: S/P CABG x 4  Patient's Home Medications on Admission:  Current Outpatient Medications:  .  allopurinol (ZYLOPRIM) 300 MG tablet, Take 300 mg by mouth daily., Disp: , Rfl:  .  amiodarone (PACERONE) 200 MG tablet, Take 1 tablet (200 mg total) by mouth 2 (two) times daily., Disp: 30 tablet, Rfl: 1 .  aspirin EC 81 MG tablet, Take 81 mg by mouth daily., Disp: , Rfl:  .  atorvastatin (LIPITOR) 80 MG tablet, Take 80 mg by mouth daily., Disp: , Rfl:  .  betamethasone valerate lotion (VALISONE) 0.1 %, Apply 1 application topically daily as needed for irritation., Disp: , Rfl:  .  chlorthalidone (HYGROTON) 25 MG tablet, Take 0.5 tablets (12.5 mg total) by mouth daily., Disp: 15 tablet, Rfl: 5 .  clopidogrel (PLAVIX) 75 MG tablet, Take 1 tablet (75 mg total) by mouth daily., Disp: 30 tablet, Rfl: 2 .  clopidogrel (PLAVIX) 75 MG tablet, Take 1 tablet (75 mg total) by mouth daily., Disp: 90 tablet, Rfl: 3 .  colchicine 0.6 MG tablet, Take 1 tablet (0.6 mg total) by mouth daily., Disp: 30 tablet, Rfl: 0 .  cyclobenzaprine (FLEXERIL) 5 MG tablet, Take 1 tablet (5 mg total) by mouth 3 (three) times daily as needed for muscle spasms., Disp: 15 tablet, Rfl: 0 .  desonide (DESOWEN) 0.05 % cream, Apply 1 application topically 2 (two) times daily., Disp: , Rfl:  .  diclofenac Sodium (VOLTAREN) 1 % GEL, Apply 4 g topically 4 (four) times daily., Disp: , Rfl:  .  glucose blood test strip, Test blood ac and hs...the patient has been instructed., Disp: 100 each, Rfl: 12 .  indomethacin (INDOCIN) 50 MG capsule, Take 50 mg by mouth daily as  needed. Gout pain, Disp: , Rfl:  .  ketoconazole (NIZORAL) 2 % shampoo, Apply 1 application topically 2 (two) times a week., Disp: , Rfl:  .  Lancets (ONETOUCH ULTRASOFT) lancets, Use as instructed...test ac and hs., Disp: 100 each, Rfl: 12 .  metFORMIN (GLUCOPHAGE) 500 MG tablet, Take 500 mg by mouth daily with breakfast., Disp: , Rfl:  .  metoprolol tartrate (LOPRESSOR) 25 MG tablet, Take 12.5 mg by mouth 2 (two) times daily., Disp: , Rfl:  .  nitroGLYCERIN (NITROSTAT) 0.4 MG SL tablet, Place 1 tablet (0.4 mg total) under the tongue every 5 (five) minutes as needed for chest pain., Disp: 25 tablet, Rfl: 3  Past Medical History: Past Medical History:  Diagnosis Date  . CAD (coronary artery disease)    severe distal left main stenosis with plaque rupture, extending into the ostial LAD and occluded distal LAD, likely from embolization.  He had mild to moderate pRCA stenosis. He underwent POBA of distal LAD  . Diabetes mellitus without complication ([place])   . HFrEF (heart failure with reduced ejection fraction) (HCC)    2/6 TTE 45-50%, severe LVH, G1DD  . History of tobacco use   . Hyperlipidemia   . Hypertension   . OSA on CPAP    wears CPAP qHS per pt  . PAC (premature atrial contraction)  PAC verus post cabg AF on amiodarone s/p cath. Not on Deputy.  . S/P CABG x 4    2/7 LIMA-LAD, SVG-diagonal, SVG-OM1 to OM2    Tobacco Use: Social History   Tobacco Use  Smoking Status Former Smoker  . Packs/day: 0.25  . Years: 5.00  . Pack years: 1.25  . Types: Cigarettes  . Quit date: 23  . Years since quitting: 26.3  Smokeless Tobacco Never Used    Labs: Recent Chemical engineer    Labs for ITP Cardiac and Pulmonary Rehab Latest Ref Rng & Units 09/18/2019 09/18/2019 09/20/2019 09/21/2019 11/29/2019   Cholestrol 100 - 199 mg/dL - - - - 94(L)   LDLCALC 0 - 99 mg/dL - - - - 40   HDL >39 mg/dL - - - - 34(L)   Trlycerides 0 - 149 mg/dL - - - - 109   Hemoglobin A1c 4.8 - 5.6 % - - 6.5(H)  6.6(H) -   PHART 7.350 - 7.450 7.320(L) 7.302(L) - - -   PCO2ART 32.0 - 48.0 mmHg 42.0 44.7 - - -   HCO3 20.0 - 28.0 mmol/L 21.3 21.7 - - -   TCO2 22 - 32 mmol/L 22 23 - - -   ACIDBASEDEF 0.0 - 2.0 mmol/L 4.0(H) 4.0(H) - - -   O2SAT % 95.0 92.0 - - -       Exercise Target Goals: Exercise Program Goal: Individual exercise prescription set using results from initial 6 min walk test and THRR while considering  patient's activity barriers and safety.   Exercise Prescription Goal: Initial exercise prescription builds to 30-45 minutes a day of aerobic activity, 2-3 days per week.  Home exercise guidelines will be given to patient during program as part of exercise prescription that the participant will acknowledge.   Education: Aerobic Exercise & Resistance Training: - Gives group verbal and written instruction on the various components of exercise. Focuses on aerobic and resistive training programs and the benefits of this training and how to safely progress through these programs..   Education: Exercise & Equipment Safety: - Individual verbal instruction and demonstration of equipment use and safety with use of the equipment.   Cardiac Rehab from 10/18/2019 in [facility] Cardiac and Pulmonary Rehab  Date  10/18/19  Educator  AS  Instruction Review Code  1- Verbalizes Understanding      Education: Exercise Physiology & General Exercise Guidelines: - Group verbal and written instruction with models to review the exercise physiology of the cardiovascular system and associated critical values. Provides general exercise guidelines with specific guidelines to those with heart or lung disease.    Education: Flexibility, Balance, Mind/Body Relaxation: Provides group verbal/written instruction on the benefits of flexibility and balance training, including mind/body exercise modes such as yoga, pilates and tai chi.  Demonstration and skill practice provided.   Activity Barriers & Risk  Stratification:   6 Minute Walk: 6 Minute Walk    Row Name 10/18/19 1308         6 Minute Walk   Phase  Initial     Distance  1172 feet     Walk Time  6 minutes     # of Rest Breaks  0     MPH  2.22     METS  2.22     RPE  7     Perceived Dyspnea   0     VO2 Peak  7.8     Symptoms  No  Resting HR  60 bpm     Resting BP  112/60     Resting Oxygen Saturation   100 %     Exercise Oxygen Saturation  during 6 min walk  98 %     Max Ex. HR  91 bpm     Max Ex. BP  146/64     2 Minute Post BP  120/66        Oxygen Initial Assessment:   Oxygen Re-Evaluation:   Oxygen Discharge (Final Oxygen Re-Evaluation):   Initial Exercise Prescription: Initial Exercise Prescription - 10/18/19 1300      Date of Initial Exercise RX and Referring Provider   Date  10/18/19    Referring Provider  Arida      Treadmill   MPH  2    Grade  0    Minutes  15    METs  2.5      Recumbant Bike   Level  2    RPM  60    Minutes  15    METs  2.2      Arm Ergometer   Level  1    RPM  30    Minutes  15    METs  2.2      REL-XR   Level  2    Speed  50    Minutes  15    METs  2.2      Prescription Details   Frequency (times per week)  3    Duration  Progress to 30 minutes of continuous aerobic without signs/symptoms of physical distress      Intensity   THRR 40-80% of Max Heartrate  95-130    Ratings of Perceived Exertion  11-13    Perceived Dyspnea  0-4      Resistance Training   Training Prescription  Yes    Weight  3 lb    Reps  10-15       Perform Capillary Blood Glucose checks as needed.  Exercise Prescription Changes: Exercise Prescription Changes    Row Name 10/18/19 1300 10/24/19 1100 11/06/19 1300 11/16/19 1300 11/21/19 1300     Response to Exercise   Blood Pressure (Admit)  112/60  124/60  122/70  --  130/68   Blood Pressure (Exercise)  146/64  138/70  144/74  --  122/62   Blood Pressure (Exit)  120/66  125/58  124/70  --  106/58   Heart Rate (Admit)   60 bpm  78 bpm  66 bpm  --  40 bpm   Heart Rate (Exercise)  91 bpm  93 bpm  100 bpm  --  117 bpm   Heart Rate (Exit)  67 bpm  71 bpm  65 bpm  --  57 bpm   Oxygen Saturation (Admit)  100 %  --  --  --  --   Oxygen Saturation (Exercise)  98 %  --  --  --  --   Rating of Perceived Exertion (Exercise)  '7  13  12  ' --  12   Perceived Dyspnea (Exercise)  0  --  --  --  --   Symptoms  none  none  none  --  none   Duration  --  Continue with 30 min of aerobic exercise without signs/symptoms of physical distress.  Continue with 30 min of aerobic exercise without signs/symptoms of physical distress.  --  Continue with 30 min of aerobic exercise without signs/symptoms of physical distress.  Intensity  --  THRR unchanged  THRR unchanged  --  THRR unchanged     Progression   Progression  --  --  Continue to progress workloads to maintain intensity without signs/symptoms of physical distress.  --  Continue to progress workloads to maintain intensity without signs/symptoms of physical distress.   Average METs  --  --  2.49  --  2.6     Resistance Training   Training Prescription  --  Yes  Yes  --  Yes   Weight  --  3 lb  3 lb  --  4 lb   Reps  --  10-15  10-15  --  10-15     Interval Training   Interval Training  --  No  No  --  No     Treadmill   MPH  --  2  2  --  1.8   Grade  --  0  0.5  --  0   Minutes  --  15  15  --  15   METs  --  2.5  2.67  --  2.38     Recumbant Bike   Level  --  --  2  --  --   Minutes  --  --  15  --  --     Arm Ergometer   Level  --  --  1  --  --   Minutes  --  --  15  --  --   METs  --  --  2.3  --  --     Elliptical   Level  --  1  --  --  --   Minutes  --  15  --  --  --     REL-XR   Level  --  --  --  --  2   Speed  --  --  --  --  50   Minutes  --  --  --  --  15   METs  --  --  --  --  2.8     Home Exercise Plan   Plans to continue exercise at  --  --  --  [employer] (comment)  [employer]nsic [employer] (comment)   Frequency  --  --  --  Add 2  additional days to program exercise sessions.  Add 2 additional days to program exercise sessions.   Initial Home Exercises Provided  --  --  --  11/16/19  11/16/19   Row Name 12/04/19 1500 12/12/19 1600 12/25/19 1600         Response to Exercise   Blood Pressure (Admit)  134/66  120/72  142/78     Blood Pressure (Exercise)  124/64  128/80  130/74     Blood Pressure (Exit)  126/64  112/64  122/62     Heart Rate (Admit)  61 bpm  66 bpm  63 bpm     Heart Rate (Exercise)  93 bpm  71 bpm  96 bpm     Heart Rate (Exit)  66 bpm  68 bpm  62 bpm     Rating of Perceived Exertion (Exercise)  '12  13  13     ' Symptoms  none  none  none     Duration  Continue with 30 min of aerobic exercise without signs/symptoms of physical distress.  Continue with 30 min of aerobic exercise without signs/symptoms of physical distress.  Continue with 30 min of  aerobic exercise without signs/symptoms of physical distress.     Intensity  THRR unchanged  THRR unchanged  THRR unchanged       Progression   Progression  Continue to progress workloads to maintain intensity without signs/symptoms of physical distress.  Continue to progress workloads to maintain intensity without signs/symptoms of physical distress.  Continue to progress workloads to maintain intensity without signs/symptoms of physical distress.     Average METs  2.29  2.5  2.68       Resistance Training   Training Prescription  Yes  Yes  Yes     Weight  4 lb  4 lb  4 lb     Reps  10-15  10-15  10-15       Interval Training   Interval Training  No  --  No       Treadmill   MPH  2  --  2.2     Grade  0.5  --  0.5     Minutes  15  --  15     METs  2.67  --  2.84       Recumbant Bike   Level  '2  2  2     ' RPM  --  60  --     Minutes  '15  15  15     ' METs  --  2.85  3.2       NuStep   Level  --  --  1     Minutes  --  --  15     METs  --  --  2.8       Arm Ergometer   Level  2  2  --     RPM  --  30  --     Minutes  15  15  --     METs  2.1   2.2  --       REL-XR   Level  1  --  1     Minutes  15  --  15     METs  2.1  --  --       T5 Nustep   Level  --  --  1     Minutes  --  --  15     METs  --  --  1.9       Home Exercise Plan   Plans to continue exercise at  [employer] (comment)  --  Forensic scientist (comment)     Frequency  Add 2 additional days to program exercise sessions.  --  Add 2 additional days to program exercise sessions.     Initial Home Exercises Provided  11/16/19  --  11/16/19        Exercise Comments: Exercise Comments    Row Name 10/24/19 0831 11/21/19 0802         Exercise Comments  First full day of exercise!  Patient was oriented to gym and equipment including functions, settings, policies, and procedures.  Patient's individual exercise prescription and treatment plan were reviewed.  All starting workloads were established based on the results of the 6 minute walk test done at initial orientation visit.  The plan for exercise progression was also introduced and progression will be customized based on patient's performance and goals.  Shanon Brow arrived to tell staff he was feeling very fatigued. He received his COVID Vaccine yesterday.  Sent him home to rest, no session today  Exercise Goals and Review: Exercise Goals    Row Name 10/18/19 1317             Exercise Goals   Increase Physical Activity  Yes       Intervention  Provide advice, education, support and counseling about physical activity/exercise needs.;Develop an individualized exercise prescription for aerobic and resistive training based on initial evaluation findings, risk stratification, comorbidities and participant's personal goals.       Expected Outcomes  Short Term: Attend rehab on a regular basis to increase amount of physical activity.;Long Term: Add in home exercise to make exercise part of routine and to increase amount of physical activity.;Long Term: Exercising regularly at least 3-5 days a week.        Increase Strength and Stamina  Yes       Intervention  Provide advice, education, support and counseling about physical activity/exercise needs.;Develop an individualized exercise prescription for aerobic and resistive training based on initial evaluation findings, risk stratification, comorbidities and participant's personal goals.       Expected Outcomes  Short Term: Increase workloads from initial exercise prescription for resistance, speed, and METs.;Short Term: Perform resistance training exercises routinely during rehab and add in resistance training at home;Long Term: Improve cardiorespiratory fitness, muscular endurance and strength as measured by increased METs and functional capacity (6MWT)       Able to understand and use rate of perceived exertion (RPE) scale  Yes       Intervention  Provide education and explanation on how to use RPE scale       Expected Outcomes  Short Term: Able to use RPE daily in rehab to express subjective intensity level;Long Term:  Able to use RPE to guide intensity level when exercising independently       Able to understand and use Dyspnea scale  Yes       Intervention  Provide education and explanation on how to use Dyspnea scale       Expected Outcomes  Short Term: Able to use Dyspnea scale daily in rehab to express subjective sense of shortness of breath during exertion;Long Term: Able to use Dyspnea scale to guide intensity level when exercising independently       Knowledge and understanding of Target Heart Rate Range (THRR)  Yes       Intervention  Provide education and explanation of THRR including how the numbers were predicted and where they are located for reference       Expected Outcomes  Short Term: Able to state/look up THRR;Short Term: Able to use daily as guideline for intensity in rehab;Long Term: Able to use THRR to govern intensity when exercising independently       Able to check pulse independently  Yes       Intervention  Provide education and  demonstration on how to check pulse in carotid and radial arteries.;Review the importance of being able to check your own pulse for safety during independent exercise       Expected Outcomes  Short Term: Able to explain why pulse checking is important during independent exercise;Long Term: Able to check pulse independently and accurately       Understanding of Exercise Prescription  Yes       Intervention  Provide education, explanation, and written materials on patient's individual exercise prescription       Expected Outcomes  Short Term: Able to explain program exercise prescription;Long Term: Able to explain home exercise prescription to exercise independently  Exercise Goals Re-Evaluation : Exercise Goals Re-Evaluation    Row Name 10/24/19 0831 11/06/19 1343 11/16/19 1309 11/21/19 1342 12/04/19 1544     Exercise Goal Re-Evaluation   Exercise Goals Review  Able to understand and use rate of perceived exertion (RPE) scale;Knowledge and understanding of Target Heart Rate Range (THRR);Understanding of Exercise Prescription  Increase Physical Activity;Increase Strength and Stamina;Understanding of Exercise Prescription  Increase Physical Activity;Increase Strength and Stamina;Able to understand and use rate of perceived exertion (RPE) scale;Able to understand and use Dyspnea scale;Knowledge and understanding of Target Heart Rate Range (THRR);Able to check pulse independently;Understanding of Exercise Prescription  Increase Physical Activity;Increase Strength and Stamina;Able to understand and use rate of perceived exertion (RPE) scale;Able to understand and use Dyspnea scale;Knowledge and understanding of Target Heart Rate Range (THRR);Able to check pulse independently;Understanding of Exercise Prescription  Increase Physical Activity;Increase Strength and Stamina;Understanding of Exercise Prescription   Comments  Reviewed RPE scale, THR and program prescription with pt today.  Pt voiced  understanding and was given a copy of goals to take home.  Shanon Brow is off to a good start in rehab.  He has completed his first four full days of exercise.  We will continue to monitor his progress.  eviewed home exercise with pt today.  Pt plans to go to [employer] for exercise.  Reviewed THR, pulse, RPE, sign and symptoms, NTG use, and when to call 911 or MD.  Also discussed weather considerations and indoor options.  Pt voiced understanding.  Shanon Brow has moved up to 4 lb for strength work.  He has a Higher education careers adviser at [employer] and plans to exercise there on weekends.  Shanon Brow is doing well in rehab.  He is now on level 2 for the arm crank. We will continue to monitor his progress.   Expected Outcomes  Short: Use RPE daily to regulate intensity. Long: Follow program prescription in THR.  Short: Continue to attend regularly  Long: Continue to follow program prescription.  Short : exercsie 1-2 days outside HT LOng: maintain exercise on his own  Short: add in exercise on weekends Long: increase stamina  Short: Increase workload on XR  Long: Continue to improve stamina   Row Name 12/12/19 0815 12/25/19 1621           Exercise Goal Re-Evaluation   Exercise Goals Review  Increase Physical Activity;Increase Strength and Stamina;Able to understand and use rate of perceived exertion (RPE) scale;Able to understand and use Dyspnea scale;Knowledge and understanding of Target Heart Rate Range (THRR);Able to check pulse independently;Understanding of Exercise Prescription  Increase Physical Activity;Increase Strength and Stamina;Understanding of Exercise Prescription      Comments  We discussed adding on emore day of exercise on weekends.  Shanon Brow is doing well in rehab.  He is out this week for vacation on a trout fishing trip.  He was looking forward to it and hopes to get in some walking as well.  He is up to 2.2 mph on the treadmill.  We will continue to monitor his progress.      Expected Outcomes  Short:  add one  day of exercise on weekends Long: exercise 3-5 days per week  Short: Walk while on vacation  Long: Continue to improve stamina         Discharge Exercise Prescription (Final Exercise Prescription Changes): Exercise Prescription Changes - 12/25/19 1600      Response to Exercise   Blood Pressure (Admit)  142/78    Blood Pressure (Exercise)  130/74  Blood Pressure (Exit)  122/62    Heart Rate (Admit)  63 bpm    Heart Rate (Exercise)  96 bpm    Heart Rate (Exit)  62 bpm    Rating of Perceived Exertion (Exercise)  13    Symptoms  none    Duration  Continue with 30 min of aerobic exercise without signs/symptoms of physical distress.    Intensity  THRR unchanged      Progression   Progression  Continue to progress workloads to maintain intensity without signs/symptoms of physical distress.    Average METs  2.68      Resistance Training   Training Prescription  Yes    Weight  4 lb    Reps  10-15      Interval Training   Interval Training  No      Treadmill   MPH  2.2    Grade  0.5    Minutes  15    METs  2.84      Recumbant Bike   Level  2    Minutes  15    METs  3.2      NuStep   Level  1    Minutes  15    METs  2.8      REL-XR   Level  1    Minutes  15      T5 Nustep   Level  1    Minutes  15    METs  1.9      Home Exercise Plan   Plans to continue exercise at  [employer] (comment)    Frequency  Add 2 additional days to program exercise sessions.    Initial Home Exercises Provided  11/16/19       Nutrition:  Target Goals: Understanding of nutrition guidelines, daily intake of sodium <1510m, cholesterol <2022m calories 30% from fat and 7% or less from saturated fats, daily to have 5 or more servings of fruits and vegetables.  Education: Controlling Sodium/Reading Food Labels -Group verbal and written material supporting the discussion of sodium use in heart healthy nutrition. Review and explanation with models, verbal and written materials for  utilization of the food label.   Education: General Nutrition Guidelines/Fats and Fiber: -Group instruction provided by verbal, written material, models and posters to present the general guidelines for heart healthy nutrition. Gives an explanation and review of dietary fats and fiber.   Biometrics: Pre Biometrics - 10/18/19 1318      Pre Biometrics   Height  '5\' 6"'  (1.676 m)    Weight  201 lb 14.4 oz (91.6 kg)    BMI (Calculated)  32.6        Nutrition Therapy Plan and Nutrition Goals: Nutrition Therapy & Goals - 11/20/19 0701      Nutrition Therapy   Diet  HH, Low Na    Protein (specify units)  75-80g    Fiber  30 grams    Whole Grain Foods  3 servings    Saturated Fats  12 max. grams    Fruits and Vegetables  5 servings/day    Sodium  1.5 grams      Personal Nutrition Goals   Nutrition Goal  ST: continue current diet LT: enhance longevity    Comments  awake 330-430am B: coffee, toast or english muffin with butter, jelly, or peanut butter B2: coffee (sometimes) Lor S: sandwich ([place] wrap) or some peanut butter crackers D: wife cooks or leftovers. Meat, vegetables. Pt  reports weight stable for 30 years. Pt reports for snacks will also have trail mix and fruit. Pt reports having a variety of vegetables and using HH methods of cooking. Suggested whole grains for breads and wraps. Discussed HH eating. Pt reports eating intuitively.      Intervention Plan   Intervention  Prescribe, educate and counsel regarding individualized specific dietary modifications aiming towards targeted core components such as weight, hypertension, lipid management, diabetes, heart failure and other comorbidities.;Nutrition handout(s) given to patient.    Expected Outcomes  Short Term Goal: Understand basic principles of dietary content, such as calories, fat, sodium, cholesterol and nutrients.;Short Term Goal: A plan has been developed with personal nutrition goals set during dietitian  appointment.;Long Term Goal: Adherence to prescribed nutrition plan.       Nutrition Assessments: Nutrition Assessments - 10/19/19 1124      MEDFICTS Scores   Pre Score  99       MEDIFICTS Score Key:          ?70 Need to make dietary changes          40-70 Heart Healthy Diet         ? 40 Therapeutic Level Cholesterol Diet  Nutrition Goals Re-Evaluation: Nutrition Goals Re-Evaluation    [place] Name 12/19/19 0815             Goals   Nutrition Goal  ST: continue current diet LT: enhance longevity       Comment  Continue with current changes       Expected Outcome  ST: continue current diet LT: enhance longevity          Nutrition Goals Discharge (Final Nutrition Goals Re-Evaluation): Nutrition Goals Re-Evaluation - 12/19/19 0815      Goals   Nutrition Goal  ST: continue current diet LT: enhance longevity    Comment  Continue with current changes    Expected Outcome  ST: continue current diet LT: enhance longevity       Psychosocial: Target Goals: Acknowledge presence or absence of significant depression and/or stress, maximize coping skills, provide positive support system. Participant is able to verbalize types and ability to use techniques and skills needed for reducing stress and depression.   Education: Depression - Provides group verbal and written instruction on the correlation between heart/lung disease and depressed mood, treatment options, and the stigmas associated with seeking treatment.   Education: Sleep Hygiene -Provides group verbal and written instruction about how sleep can affect your health.  Define sleep hygiene, discuss sleep cycles and impact of sleep habits. Review good sleep hygiene tips.     Education: Stress and Anxiety: - Provides group verbal and written instruction about the health risks of elevated stress and causes of high stress.  Discuss the correlation between heart/lung disease and anxiety and treatment options. Review healthy ways  to manage with stress and anxiety.    Initial Review & Psychosocial Screening: Initial Psych Review & Screening - 10/17/19 1311      Initial Review   Current issues with  Current Sleep Concerns      Family Dynamics   Good Support System?  Yes    Comments  His incision is still tender which is making it hard to sleep. He can look to his wife for support.      Barriers   Psychosocial barriers to participate in program  There are no identifiable barriers or psychosocial needs.      Screening Interventions   Interventions  Provide  feedback about the scores to participant;Encouraged to exercise;Program counselor consult;To provide support and resources with identified psychosocial needs    Expected Outcomes  Short Term goal: Utilizing psychosocial counselor, staff and physician to assist with identification of specific Stressors or current issues interfering with healing process. Setting desired goal for each stressor or current issue identified.;Long Term Goal: Stressors or current issues are controlled or eliminated.;Short Term goal: Identification and review with participant of any Quality of Life or Depression concerns found by scoring the questionnaire.;Long Term goal: The participant improves quality of Life and PHQ9 Scores as seen by post scores and/or verbalization of changes       Quality of Life Scores:  Quality of Life - 10/18/19 1320      Quality of Life   Select  Quality of Life      Quality of Life Scores   Health/Function Pre  28.8 %    Socioeconomic Pre  29.14 %    Psych/Spiritual Post  30 %    Family Pre  30 %      Scores of 19 and below usually indicate a poorer quality of life in these areas.  A difference of  2-3 points is a clinically meaningful difference.  A difference of 2-3 points in the total score of the Quality of Life Index has been associated with significant improvement in overall quality of life, self-image, physical symptoms, and general health in  studies assessing change in quality of life.  PHQ-9: Recent Review Flowsheet Data    Depression screen [facility] 2/9 11/28/2019 10/18/2019   Decreased Interest 0 1   Down, Depressed, Hopeless 0 1   PHQ - 2 Score 0 2   Altered sleeping 1 1   Tired, decreased energy 1 1   Change in appetite 0 1   Feeling bad or failure about yourself  0 0   Trouble concentrating 0 1   Moving slowly or fidgety/restless 0 1   Suicidal thoughts 0 0   PHQ-9 Score 2 7   Difficult doing work/chores Not difficult at all  Somewhat difficult     Interpretation of Total Score  Total Score Depression Severity:  1-4 = Minimal depression, 5-9 = Mild depression, 10-14 = Moderate depression, 15-19 = Moderately severe depression, 20-27 = Severe depression   Psychosocial Evaluation and Intervention: Psychosocial Evaluation - 10/17/19 1312      Psychosocial Evaluation & Interventions   Interventions  Encouraged to exercise with the program and follow exercise prescription    Comments  His incision is still tender which is making it hard to sleep. He can look to his wife for support.    Expected Outcomes  Short: Attend [facility] stress management education to decrease stress. Long: Maintain exercise Post Heart[facility] to keep stress at a minimum.    Continue Psychosocial Services   Follow up required by staff       Psychosocial Re-Evaluation: Psychosocial Re-Evaluation    Row Name 11/23/19 0752 12/12/19 3267           Psychosocial Re-Evaluation   Current issues with  Current Sleep Concerns;Current Stress Concerns  Current Sleep Concerns;Current Stress Concerns      Comments  His incision is feeling better so he has been sleeping better.  He feels everything is moving in the right direction.  Shanon Brow is sleeping well and feeels fine.  He got second Covid vaccine yesterday.      Expected Outcomes  Short: continue good sleep patterns Long: maintain positive outlook  Short:  continue good sleep patterns Long: maintain  positive outlook         Psychosocial Discharge (Final Psychosocial Re-Evaluation): Psychosocial Re-Evaluation - 12/12/19 0812      Psychosocial Re-Evaluation   Current issues with  Current Sleep Concerns;Current Stress Concerns    Comments  Shanon Brow is sleeping well and feeels fine.  He got second Covid vaccine yesterday.    Expected Outcomes  Short:  continue good sleep patterns Long: maintain positive outlook       Vocational Rehabilitation: Provide vocational rehab assistance to qualifying candidates.   Vocational Rehab Evaluation & Intervention:   Education: Education Goals: Education classes will be provided on a variety of topics geared toward better understanding of heart health and risk factor modification. Participant will state understanding/return demonstration of topics presented as noted by education test scores.  Learning Barriers/Preferences: Learning Barriers/Preferences - 10/17/19 1313      Learning Barriers/Preferences   Learning Barriers  None    Learning Preferences  None       General Cardiac Education Topics:  AED/CPR: - Group verbal and written instruction with the use of models to demonstrate the basic use of the AED with the basic ABC's of resuscitation.   Anatomy & Physiology of the Heart: - Group verbal and written instruction and models provide basic cardiac anatomy and physiology, with the coronary electrical and arterial systems. Review of Valvular disease and Heart Failure   Cardiac Procedures: - Group verbal and written instruction to review commonly prescribed medications for heart disease. Reviews the medication, class of the drug, and side effects. Includes the steps to properly store meds and maintain the prescription regimen. (beta blockers and nitrates)   Cardiac Medications I: - Group verbal and written instruction to review commonly prescribed medications for heart disease. Reviews the medication, class of the drug, and side  effects. Includes the steps to properly store meds and maintain the prescription regimen.   Cardiac Medications II: -Group verbal and written instruction to review commonly prescribed medications for heart disease. Reviews the medication, class of the drug, and side effects. (all other drug classes)    Go Sex-Intimacy & Heart Disease, Get SMART - Goal Setting: - Group verbal and written instruction through game format to discuss heart disease and the return to sexual intimacy. Provides group verbal and written material to discuss and apply goal setting through the application of the S.M.A.R.T. Method.   Other Matters of the Heart: - Provides group verbal, written materials and models to describe Stable Angina and Peripheral Artery. Includes description of the disease process and treatment options available to the cardiac patient.   Infection Prevention: - Provides verbal and written material to individual with discussion of infection control including proper hand washing and proper equipment cleaning during exercise session.   Cardiac Rehab from 10/18/2019 in [facility] Cardiac and Pulmonary Rehab  Date  10/18/19  Educator  AS  Instruction Review Code  1- Verbalizes Understanding      Falls Prevention: - Provides verbal and written material to individual with discussion of falls prevention and safety.   Cardiac Rehab from 10/18/2019 in [facility] Cardiac and Pulmonary Rehab  Date  10/18/19  Educator  AS  Instruction Review Code  1- Verbalizes Understanding      Other: -Provides group and verbal instruction on various topics (see comments)   Knowledge Questionnaire Score: Knowledge Questionnaire Score - 10/18/19 1320      Knowledge Questionnaire Score   Pre Score  24/26  Core Components/Risk Factors/Patient Goals at Admission: Personal Goals and Risk Factors at Admission - 10/18/19 1319      Core Components/Risk Factors/Patient Goals on Admission    Weight Management   Yes;Weight Maintenance;Weight Loss    Admit Weight  201 lb 14.4 oz (91.6 kg)    Goal Weight: Short Term  190 lb (86.2 kg)    Goal Weight: Long Term  180 lb (81.6 kg)    Expected Outcomes  Short Term: Continue to assess and modify interventions until short term weight is achieved;Long Term: Adherence to nutrition and physical activity/exercise program aimed toward attainment of established weight goal;Weight Loss: Understanding of general recommendations for a balanced deficit meal plan, which promotes 1-2 lb weight loss per week and includes a negative energy balance of [phone] kcal/d;Understanding recommendations for meals to include 15-35% energy as protein, 25-35% energy from fat, 35-60% energy from carbohydrates, less than 271m of dietary cholesterol, 20-35 gm of total fiber daily;Understanding of distribution of calorie intake throughout the day with the consumption of 4-5 meals/snacks    Diabetes  Yes    Intervention  Provide education about signs/symptoms and action to take for hypo/hyperglycemia.;Provide education about proper nutrition, including hydration, and aerobic/resistive exercise prescription along with prescribed medications to achieve blood glucose in normal ranges: Fasting glucose 65-99 mg/dL    Expected Outcomes  Short Term: Participant verbalizes understanding of the signs/symptoms and immediate care of hyper/hypoglycemia, proper foot care and importance of medication, aerobic/resistive exercise and nutrition plan for blood glucose control.;Long Term: Attainment of HbA1C < 7%.       Education:Diabetes - Individual verbal and written instruction to review signs/symptoms of diabetes, desired ranges of glucose level fasting, after meals and with exercise. Acknowledge that pre and post exercise glucose checks will be done for 3 sessions at entry of program.   [facility]  Date  10/18/19  Educator  AS  Instruction Review Code  1-  Verbalizes Understanding      Education: Know Your Numbers and Risk Factors: -Group verbal and written instruction about important numbers in your health.  Discussion of what are risk factors and how they play a role in the disease process.  Review of Cholesterol, Blood Pressure, Diabetes, and BMI and the role they play in your overall health.   Core Components/Risk Factors/Patient Goals Review:  Goals and Risk Factor Review    Row Name 11/23/19 0748 12/12/19 0809           Core Components/Risk Factors/Patient Goals Review   Personal Goals Review  Weight Management/Obesity;Hypertension;Lipids;Diabetes  Weight Management/Obesity;Hypertension;Lipids;Diabetes      Review  DShanon Browis taking all meds as directed.  He takes his BP a couple times per week.  He doesn't regularly check BG at home.  He feels more limbered up by coming to exercise.  He likes the routine of the program.  DShanon Browsays his weight fluctuates on our scale.  He is taking meds as directed.  He walks a lot at work but hasnt exercised outside program sessions.  He checks BP every night and it has been fine.      Expected Outcomes  Short : check with Dr about how often to monitor BG Long:  manage risk factors  Short: continue to monitor risk factos Long: keep BP/BG in acceptable range         Core Components/Risk Factors/Patient Goals at Discharge (Final Review):  Goals and Risk Factor Review - 12/12/19 07026  Core Components/Risk Factors/Patient Goals Review   Personal Goals Review  Weight Management/Obesity;Hypertension;Lipids;Diabetes    Review  Shanon Brow says his weight fluctuates on our scale.  He is taking meds as directed.  He walks a lot at work but hasnt exercised outside program sessions.  He checks BP every night and it has been fine.    Expected Outcomes  Short: continue to monitor risk factos Long: keep BP/BG in acceptable range       ITP Comments: ITP Comments    Row Name 10/17/19 1310 10/24/19 0831 11/01/19  0608 11/20/19 0717 11/21/19 0757   ITP Comments  Virtual Orientation performed. Patient informed when to come in for RD and EP orientation. Diagnosis can be found in [facility] 09/17/2019  First full day of exercise!  Patient was oriented to gym and equipment including functions, settings, policies, and procedures.  Patient's individual exercise prescription and treatment plan were reviewed.  All starting workloads were established based on the results of the 6 minute walk test done at initial orientation visit.  The plan for exercise progression was also introduced and progression will be customized based on patient's performance and goals.  30 day chart review completed. ITP sent to Dr Zachery Dakins Medical Director, for review,changes as needed and signature. Continue with ITP if no changes requested  Completed Initial RD Alberteen Spindle arrived to tell staff he was feeling very fatigued. He received his COVID Vaccine yesterday.  Sent him home to rest, no session today   Jasper Name 11/29/19 0540 12/25/19 1621 12/27/19 0544       ITP Comments  30 Day review completed. Medical Director review done, changes made as directed,and approval shown by signature of Market researcher.  Shanon Brow is out for week trout fishing in [place] [employer].  30 Day review completed. ITP review done, changes made as directed,and approval shown by signature of  Scientist, research (life sciences).        Comments:

## 2020-01-01 ENCOUNTER — Encounter: Payer: Self-pay | Admitting: Physician Assistant

## 2020-01-01 ENCOUNTER — Other Ambulatory Visit: Payer: Self-pay

## 2020-01-01 ENCOUNTER — Ambulatory Visit (INDEPENDENT_AMBULATORY_CARE_PROVIDER_SITE_OTHER): Payer: BC Managed Care – PPO | Admitting: Physician Assistant

## 2020-01-01 VITALS — BP 134/90 | HR 59 | Ht 66.5 in | Wt 209.5 lb

## 2020-01-01 DIAGNOSIS — E119 Type 2 diabetes mellitus without complications: Secondary | ICD-10-CM

## 2020-01-01 DIAGNOSIS — E785 Hyperlipidemia, unspecified: Secondary | ICD-10-CM | POA: Diagnosis not present

## 2020-01-01 DIAGNOSIS — Z87891 Personal history of nicotine dependence: Secondary | ICD-10-CM

## 2020-01-01 DIAGNOSIS — Z951 Presence of aortocoronary bypass graft: Secondary | ICD-10-CM | POA: Diagnosis not present

## 2020-01-01 DIAGNOSIS — I480 Paroxysmal atrial fibrillation: Secondary | ICD-10-CM | POA: Diagnosis not present

## 2020-01-01 DIAGNOSIS — I5032 Chronic diastolic (congestive) heart failure: Secondary | ICD-10-CM

## 2020-01-01 DIAGNOSIS — I252 Old myocardial infarction: Secondary | ICD-10-CM

## 2020-01-01 DIAGNOSIS — Z79899 Other long term (current) drug therapy: Secondary | ICD-10-CM

## 2020-01-01 DIAGNOSIS — I2510 Atherosclerotic heart disease of native coronary artery without angina pectoris: Secondary | ICD-10-CM

## 2020-01-01 NOTE — Patient Instructions (Addendum)
Medication Instructions:  Your physician has recommended you make the following change in your medication:   STOP Amiodarone  *If you need a refill on your cardiac medications before your next appointment, please call your pharmacy*   Lab Work: None ordered If you have labs (blood work) drawn today and your tests are completely normal, you will receive your results only by: Marland Kitchen MyChart Message (if you have MyChart) OR . A paper copy in the mail If you have any lab test that is abnormal or we need to change your treatment, we will call you to review the results.   Testing/Procedures: None ordered    Follow-Up: At [facility], you and your health needs are our priority.  As part of our continuing mission to provide you with exceptional heart care, we have created designated Provider Care Teams.  These Care Teams include your primary Cardiologist (physician) and Advanced Practice Providers (APPs -  Physician Assistants and Nurse Practitioners) who all work together to provide you with the care you need, when you need it.  We recommend signing up for the patient portal called "MyChart".  Sign up information is provided on this After Visit Summary.  MyChart is used to connect with patients for Virtual Visits (Telemedicine).  Patients are able to view lab/test results, encounter notes, upcoming appointments, etc.  Non-urgent messages can be sent to your provider as well.   To learn more about what you can do with MyChart, go to [email or web address].    Your next appointment:   3 month(s)  The format for your next appointment:   In Person  Provider:    You may see Lorine Bears, MD or one of the following Advanced Practice Providers on your designated Care Team:    Nicolasa Ducking, NP  Eula Listen, PA-C  Marisue Ivan, PA-C    Other Instructions Your physician has requested that you regularly monitor and record your blood pressure readings at home. Please use the same  machine at the same time of day to check your readings and record them. Contact the office if your blood pressure is consistently >130/80

## 2020-01-01 NOTE — Progress Notes (Signed)
Office Visit    Patient Name: Brian Chapman Date of Encounter: 01/03/2020  Primary Care Provider:  [facility] Primary Cardiologist:  Lorine Bears, MD  Chief Complaint    Chief Complaint  Patient presents with  . office visit    pt has no complaints. Meds verbally reviewed w/ pt.     74 year old male with history of history of CAD,STEMI 09/16/19, CABG x4 09/17/19(LIMA-LAD, SVG-diagonal, SVG-OM1 to OM2),possible post CABGAF,DM2, hypertension, hyperlipidemia,former smoker,obesity, and seen today for follow-up s/p CABG.   Past Medical History    Past Medical History:  Diagnosis Date  . CAD (coronary artery disease)    severe distal left main stenosis with plaque rupture, extending into the ostial LAD and occluded distal LAD, likely from embolization.  He had mild to moderate pRCA stenosis. He underwent POBA of distal LAD  . Diabetes mellitus without complication (HCC)   . HFrEF (heart failure with reduced ejection fraction) (HCC)    2/6 TTE 45-50%, severe LVH, G1DD  . History of tobacco use   . Hyperlipidemia   . Hypertension   . OSA on CPAP    wears CPAP qHS per pt  . PAC (premature atrial contraction)    PAC verus post cabg AF on amiodarone s/p cath. Not on OAC.  . S/P CABG x 4    2/7 LIMA-LAD, SVG-diagonal, SVG-OM1 to OM2   Past Surgical History:  Procedure Laterality Date  . CARDIAC CATHETERIZATION    . CORONARY ARTERY BYPASS GRAFT N/A 09/17/2019   Procedure: CORONARY ARTERY BYPASS GRAFTING (CABG), ON PUMP, TIMES FOUR, USING LEFT INTERNAL MAMMARY ARTERY AND RIGHT GREATER SAPHENOUS VEIN HARVESTED ENDOSCOPICALLY;  Surgeon: Linden Dolin, MD;  Location: [facility] OR;  Service: Open Heart Surgery;  Laterality: N/A;  . CORONARY/GRAFT ACUTE MI REVASCULARIZATION N/A 09/16/2019   Procedure: Coronary/Graft Acute MI Revascularization;  Surgeon: Iran Ouch, MD;  Location: [facility] INVASIVE CV LAB;  Service: Cardiovascular;  Laterality: N/A;  . LEFT HEART CATH  AND CORONARY ANGIOGRAPHY N/A 09/16/2019   Procedure: LEFT HEART CATH AND CORONARY ANGIOGRAPHY;  Surgeon: Iran Ouch, MD;  Location: [facility] INVASIVE CV LAB;  Service: Cardiovascular;  Laterality: N/A;  . TEE WITHOUT CARDIOVERSION N/A 09/17/2019   Procedure: TRANSESOPHAGEAL ECHOCARDIOGRAM (TEE);  Surgeon: Linden Dolin, MD;  Location: [facility] OR;  Service: Open Heart Surgery;  Laterality: N/A;  . TONSILLECTOMY    . VASECTOMY      Allergies  Allergies  Allergen Reactions  . Lisinopril Cough  . Angiotensin Receptor Blockers     cough    History of Present Illness    Brian ABBOT Chapman is a 74 y.o. male with PMH as above. He mostly follows at the [place]. He has history of previous cardiac catheterization and mild CAD before his recent CABG; however, details unknown and records not available per EMR. He works as a Production designer, theatre/television/film at [employer].Of note, he had COVID-19 06/2018 for ~10 days but did not require hospitalization and no residual symptoms.   On 09/16/19, he was on his way to work when he started to have severe substernal chest tightness and heaviness with associated shortness of breath. He wasfound to have severe distal left main stenosis with plaque rupture, extending into the ostial LAD and occluded distal LAD, likely from embolization. He had mild to moderate pRCAs.He underwentPOBAof distal LAD.TTE showed EF 45 to 50%.He underwent CABGx4 as copied and pasted below 09/17/19(LIMA-LAD, SVG-diagonal, SVG-OM1 to OM2). Right sided EVH was from the right thigh  and lower leg. Operative LVSF nl and with diffuse disease consistent with underlying DM2. He was continued on ASA, atorvastatin, metoprolol. AF was noted on telemetry during the admission, though at discharge, no clear AFwas notedand insteadnoted werePACs.  At follow-up on 2/22 with CTS/ Dr. [name], it was noted that he was holding some of his BP medications after discharge with elevated BP in clinic. He felt weak with poor  appetite. BP 157/90, HR 94.  He was seen at [facility] and fatigued as well.  He was closely monitoring his blood sugar and BP with SBP 102-132 and DBP 68-87.  He reported ongoing insomnia and right sided neckwhere he received hiscentralline.He was scheduled for cardiac rehab. BP sub-optimalwith patient reporting it well controlled at home.He noted melenawith CBC unremarkable. He had diarrhea with BMET unremarkable.  He had not yet returned to work and was concerned regarding his ongoing fatigue, as he used to be able to walk for long distances.  He was seen 3/22 and had started cardiac rehab and doing well.  He had started back at work with a reduced shift /half day work schedule of 6 hours per day and tolerating this well.  He noted increasing energy and strength. His sternal incision continued to heal well with only mild soreness. He reported improved right-sided neck pain and healing scab.  He was careful while shaving. His previously reported insomnia, dizziness with position changes, melena, and diarrhea had resolved. No further DOE with minimal activity. ARB was deferred given his allergy to ACE.  He was seen again 4/22 and reported he was now working a full schedule at [employer] with fatigue noted toward the end of the day and resolving with sleep. He did note that his job was stressful lately. He was not sleeping more than 4-5 hours nightly, which was his baseline.  CPAP compliance confirmed. His sternal incision was still somewhat TTP but no further R sided neck pain. He was still working with cardiac rehab and reported that he was doing well and glad to have the regimen. He did not have a scale for weights at home but clinic weights were noted to have increased.  BP was elevated, which he attributed to the walk in, given his home BP was lower. He had significantly reduced his intake of salt. He reported 1 cup of coffee in the AM. Due to increased weight and BP, he was started on low dose  chlorthalidone. After confirming with CTS, he was continued on Plavix with amiodarone discontinued.   5/10 echo was performed and showed EF improved from 45-50% to 60-65%, mild LVH, and mild LAE. Cardiac rehab BP 5/17 was 142/78 and 5/19 was 112/60 with resting HR 60bpm.  Today, he presents to clinic and is doing well from a cardiac standpooint. He has tolerated the initiation of chlorthalidone well. He is still attending cardiac rehab and will momentarily feel woozy (less than 1-2 seconds) when getting of the treadmill after a long workout. He denies chest pain, palpitations, dyspnea, pnd, orthopnea, n, v, dizziness, syncope, edema, weight gain, or early satiety. He reports working long hours for his job (8-10 hours) and still tires towards the end of the day. He recently took a vacation, however. He reports home SBP 125-145 and BP 50-80 with HR 55-65bpm. His incisions are healing well. He denies any s/sx of bleeding. He reports mediation compliance.    Home Medications    Prior to Admission medications   Medication Sig Start Date End Date Taking?  Authorizing Provider  allopurinol (ZYLOPRIM) 300 MG tablet Take 300 mg by mouth daily.    [provider]  amiodarone (PACERONE) 200 MG tablet Take 1 tablet (200 mg total) by mouth 2 (two) times daily. 09/21/19   Leary Roca, PA-C  aspirin EC 81 MG tablet Take 81 mg by mouth daily.    [provider]  atorvastatin (LIPITOR) 80 MG tablet Take 80 mg by mouth daily.    [provider]  betamethasone valerate lotion (VALISONE) 0.1 % Apply 1 application topically daily as needed for irritation.    [provider]  clopidogrel (PLAVIX) 75 MG tablet Take 1 tablet (75 mg total) by mouth daily. 09/22/19   Leary Roca, PA-C  colchicine 0.6 MG tablet Take 1 tablet (0.6 mg total) by mouth daily. 09/22/19   Leary Roca, PA-C  cyclobenzaprine (FLEXERIL) 5 MG tablet Take 1 tablet (5 mg total) by mouth 3 (three)  times daily as needed for muscle spasms. 06/30/19   Joni Reining, PA-C  desonide (DESOWEN) 0.05 % cream Apply 1 application topically 2 (two) times daily.    [provider]  diclofenac Sodium (VOLTAREN) 1 % GEL Apply 4 g topically 4 (four) times daily.    [provider]  glucose blood test strip Test blood ac and hs...the patient has been instructed. 10/02/19   Linden Dolin, MD  indomethacin (INDOCIN) 50 MG capsule Take 50 mg by mouth daily as needed. Gout pain    [provider]  ketoconazole (NIZORAL) 2 % shampoo Apply 1 application topically 2 (two) times a week.    [provider]  Lancets [facility] ULTRASOFT) lancets Use as instructed...test ac and hs. 10/02/19   Linden Dolin, MD  metFORMIN (GLUCOPHAGE) 500 MG tablet Take 500 mg by mouth daily with breakfast.    [provider]  metoprolol tartrate (LOPRESSOR) 25 MG tablet Take 12.5 mg by mouth 2 (two) times daily.    [provider]  nitroGLYCERIN (NITROSTAT) 0.4 MG SL tablet Place 1 tablet (0.4 mg total) under the tongue every 5 (five) minutes as needed for chest pain. 10/13/19 01/11/20  Marisue Ivan D, PA-C    Review of Systems    He denies chest pain, palpitations, dyspnea, pnd, orthopnea, n, v, dizziness, syncope, edema, weight gain, or early satiety.  All other systems reviewed and are otherwise negative except as noted above.  Physical Exam    VS:  BP 134/90 (BP Location: Left Arm, Patient Position: Sitting, Cuff Size: Normal)   Pulse (!) 59   Ht 5' 6.5" (1.689 m)   Wt 209 lb 8 oz (95 kg)   SpO2 97%   BMI 33.31 kg/m  , BMI Body mass index is 33.31 kg/m. GEN: Well nourished, well developed, in no acute distress. HEENT: normal. Neck: Supple, no JVD, carotid bruits, or masses. Cardiac: brady but regular, no murmurs, rubs, or gallops. R sided neck central line incision has healed. Central sternal incision now healing well with pink and healthy tissue towards the  distal incision. R EVH incision sites heaaled. No clubbing, cyanosis. no LEE.  Radials/DP/PT 2+ and equal bilaterally.  Respiratory:  Respirations regular and unlabored, clear to auscultation bilaterally. GI: Soft, nontender, nondistended, BS + x 4. MS: no deformity or atrophy. Skin: warm and dry, no rash. Neuro:  Strength and sensation are intact. Psych: Normal affect.  Accessory Clinical Findings    ECG personally reviewed by me today -sinus bradycardia with first-degree block, 59bpm,  previous inferior infarct and lateral ischemia as noted on prior tracings- no acute changes.  VITALS Reviewed today   Temp Readings from Last 3 Encounters:  10/16/19 (!) 97.3 F (36.3 C) (Temporal)  10/02/19 (!) 97.5 F (36.4 C) (Temporal)  09/21/19 98.5 F (36.9 C) (Oral)   BP Readings from Last 3 Encounters:  01/01/20 134/90  11/29/19 (!) 142/80  10/30/19 130/76   Pulse Readings from Last 3 Encounters:  01/01/20 (!) 59  11/29/19 (!) 52  10/30/19 64    Wt Readings from Last 3 Encounters:  01/01/20 209 lb 8 oz (95 kg)  11/29/19 212 lb (96.2 kg)  10/30/19 206 lb 8 oz (93.7 kg)     LABS  reviewed today   CareEverwhere Labs present and most recent? Yes/No: No  Lab Results  Component Value Date   WBC 7.6 10/13/2019   HGB 13.7 10/13/2019   HCT 42.9 10/13/2019   MCV 87 10/13/2019   PLT 337 10/13/2019   Lab Results  Component Value Date   CREATININE 1.15 12/11/2019   BUN 15 12/11/2019   NA 137 12/11/2019   K 4.1 12/11/2019   CL 106 12/11/2019   CO2 20 (L) 12/11/2019   Lab Results  Component Value Date   ALT 18 11/29/2019   AST 21 11/29/2019   ALKPHOS 95 11/29/2019   BILITOT 0.3 11/29/2019   Lab Results  Component Value Date   CHOL 94 (L) 11/29/2019   HDL 34 (L) 11/29/2019   LDLCALC 40 11/29/2019   TRIG 109 11/29/2019   CHOLHDL 2.8 11/29/2019    Lab Results  Component Value Date   HGBA1C 6.6 (H) 09/21/2019   No results found for: TSH   STUDIES/PROCEDURES reviewed  today   Echo 12/18/19 1. Left ventricular ejection fraction, by estimation, is 60 to 65%. The  left ventricle has normal function. The left ventricle has no regional  wall motion abnormalities. There is mild left ventricular hypertrophy.  Left ventricular diastolic parameters  were normal.  2. Right ventricular systolic function is normal. The right ventricular  size is normal. Tricuspid regurgitation signal is inadequate for assessing  PA pressure.  3. Left atrial size was mildly dilated.  4. The mitral valve is normal in structure. No evidence of mitral valve  regurgitation. No evidence of mitral stenosis.  5. The aortic valve is normal in structure. Aortic valve regurgitation is  not visualized. Mild to moderate aortic valve sclerosis/calcification is  present, without any evidence of aortic stenosis.  6. The inferior vena cava is normal in size with greater than 50%  respiratory variability, suggesting right atrial pressure of 3 mmHg.   Comparison(s): LV EF 45-50%, severe AK LV, apical and inferoapical and  distal anteroseptal/anteroapical walls.   09/17/19 CABGx4  Coronary Artery Bypass Grafting x4 Left Internal Mammary Artery to Distal Left Anterior Descending Coronary Artery;Saphenous Vein Graft torightPosterolateralCoronary Artery and 1stObtuse Marginal Branch of Left Circumflex Coronary Arteryas a sequenced graft;Sapheonous Vein Graft to1stDiagonal Branch Coronary Artery;Endoscopic Vein Harvest fromrightThigh and Lower Leg;  Rigid sternal reconstruction with linear plating system Operative Findings: ? preservedleft ventricular systolic function ? goodquality left internal mammary artery conduit ? Goodquality saphenous vein conduit ? fairquality target vessels for grafting due to diffuse disease consistent with underlying diabetes  The1stdiagonal branch of the left anterior descending coronary artery was grafted using a reversed saphenous  vein graft in an end-to-side fashion. At the site of distal anastomosis the target vessel was fairquality and measured approximately 1.20mm in diameter.  Therightposterolateralbranch of the right coronary artery was grafted using a reversed saphenous vein graft in an end-to-side fashion. At the site of distal anastomosis the target vessel was fairquality and measured approximately 1.72mm in diameter. Next, the1stobtuse marginal branch of the left circumflex coronary artery was grafted in an side-to-side fashionto create a sequenced graft configuration with the Right PLA. At the site of distal anastomosis the target vessel was fairquality and measured approximately 1.10mm in diameter..  The distal left anterior coronary artery was grafted with the left internal mammary artery in an end-to-side fashion. At the site of distal anastomosis the target vessel was fairquality and measured approximately1.33mm in diameter. All proximal vein graft anastomoses were placed directly to the ascending aorta prior to removal of the aortic cross clamp. The septal myocardial temperature rose rapidly after reperfusion of the left internal mammary artery graft. Deairing procedures were performed and the aortic cross clamp was removed  2/7 Intraoperative TEE PRE-OP FINDINGS  Left Ventricle: The left ventricle has mild-moderately reduced systolic  function, with an ejection fraction of 40-45%.  Right Ventricle: The right ventricle has normal systolic function.  Left Atrium: The left atrial appendage is well visualized and there is  evidence of thrombus present. The left atrial appendage is well visualized  and there is no evidence of thrombus present.  Right Atrium: Right atrial pressure is estimated at 10 mmHg.  Mitral Valve: The mitral valve is normal in structure. Mitral valve  regurgitation is trivial by color flow Doppler.  Tricuspid Valve: The tricuspid valve was normal in structure. Tricuspid   valve regurgitation is trivial by color flow Doppler.  Dorris Singh MD  Electronically signed by Dorris Singh MD  Signature Date/Time: 09/21/2019  2/7 US Doppler pre CABG Carotid and [place] Summary:  Right Carotid: The extracranial vessels were near-normal with only minimal  wall thickening or plaque.  Left Carotid: The extracranial vessels were near-normal with only minimal  wall thickening or plaque.  Vertebrals: Bilateral vertebral arteries demonstrate antegrade flow.  Subclavians: Not assessed.  Right Upper Extremity: Doppler waveforms remain within normal limits with  right radial compression. Doppler waveforms decrease <50% with right ulnar  compression.  Left Upper Extremity: Doppler waveforms remain within normal limits with  left radial compression. Doppler waveforms remain within normal limits  with left ulnar compression.   Cath 09/16/19: 1. Severe distal left main stenosis with plaque rupture extending into the ostial LAD with occluded distal LAD likely due to embolization. Mild to moderate proximal RCA stenosis. Surgical targets include mid LAD, first diagonal and OM 3. 2. Normal LV systolic function and moderately elevated left ventricular end-diastolic pressure. 3. Successful balloon angioplasty to the distal LAD to establish TIMI-3 flow. Cardiac catheterization was performed via the right radial artery. 4. Successful intra-aortic balloon pump placement via the right common femoral artery. Recommendations: Given this the left main involvement, diffuse LAD disease and diabetic status, I think surgical revascularization with urgent/emergent CABG is indicated. The patient did not receive any oral antiplatelet medication other than aspirin. Continue unfractionated heparin. I discussed the case with Dr. [name].  TTE 09/16/19: 1. Left ventricular ejection fraction, by visual estimation, is 45 to  50%. The left ventricle has hyperdynamic function. There is  severely  increased left ventricular hypertrophy.  2. Definity contrast agent was given IV to delineate the left ventricular  endocardial borders.  3. The left ventricle demonstrates regional wall motion abnormalities.  4. Severe akinesis of the left ventricular, apical apical segment and  inferoapical and  distal anteroseptal/anteroapical walls. No apical  thrombus.  5. Left ventricular diastolic parameters are consistent with Grade I  diastolic dysfunction (impaired relaxation).  6. Global right ventricle has mildly reduced systolic function.The right  ventricular size is normal. No increase in right ventricular wall  thickness.  7. Hypokinetic right ventricular apex.  8. Left atrial size was normal.  9. Right atrial size was normal.  10. Mild mitral annular calcification.  11. The mitral valve is grossly normal. Trivial mitral valve  regurgitation.  12. The tricuspid valve is grossly normal.  13. The tricuspid valve is grossly normal. Tricuspid valve regurgitation  is trivial.  14. The aortic valve is tricuspid. Aortic valve regurgitation is not  visualized. Mild aortic valve sclerosis without stenosis.  15. The pulmonic valve was grossly normal. Pulmonic valve regurgitation is  not visualized.  16. The interatrial septum was not well visualized.   Assessment & Plan    CAD(LIMA-LAD, SVG-diagonal, SVG-OM1 to OM2) S/p 2/6STEMI S/p 2/7 CABG x4 --No CP. He has not needed his PRN SL nitro.  --He continues with cardiac rehab and doing well.  --Sternal incision / CABG incisions almost healed.  --ContinueDAPT with ASA and Plavix.  --Escalation of BB precluded by bradycardia.  --Addition of ARB deferred given ACE cough. --Continue chlorthalidone with volume status / BP improved today. --Echo with normalization of EF. --Ongoingrisk factor modification with statin. Continue lifestyle changes.   HFrEF(EF 45-50%) --No SOBat rest.  --Volume status and BP improved  with initiation of chlorthalidone.  --Repeat echo as above with normalization of EF.  --Per GDMT with comorbid HTN/DM2, would start on ACE/ARB; however, this has been deferred given ACE cough --Defer escalation of BB given bradycardia.    HTN --BP improved with chlorthalidone. Goal BP 130/80. Due to bradycardia, defer escalation of BB. Future considerations could include transition from metoprolol to Coreg for additional BP support but as HR allows. Discussed with patient and will defer for now.   HLD --Continue statin.   ?PAFversus PACs --NSR today. No racing HR or palpitations reported.   DM2 --Most recent A1C as above.Continue metformin.Monitoring per PCP. ARB deferred 2/2 ACE cough.     Medication changes: None Labs ordered: None Studies / Imaging ordered: None Future considerations: If BP remains elevated, consider transition to Coreg for additional BP support. Continue to monitor renal function on chlorthalidone. Disposition: RTC 3 months.    Arvil Chaco, PA-C 01/03/2020

## 2020-01-02 ENCOUNTER — Encounter: Payer: No Typology Code available for payment source | Admitting: *Deleted

## 2020-01-02 DIAGNOSIS — Z951 Presence of aortocoronary bypass graft: Secondary | ICD-10-CM

## 2020-01-02 NOTE — Progress Notes (Signed)
Daily Session Note  Patient Details  Name: Brian Chapman MRN: [id number] Date of Birth: [birth year] Referring Provider:     Cardiac Rehab from 10/18/2019 in [facility] Cardiac and Pulmonary Rehab  Referring Provider  [name]      Encounter Date: 01/02/2020  Check In: Session Check In - 01/02/20 0831      Check-In   Supervising physician immediately available to respond to emergencies  See telemetry face sheet for immediately available ER MD    Location  [facility]-Cardiac & Pulmonary Rehab    Staff Present  Heath Lark, RN, BSN, CCRP;Joseph Foy [place], [place], ACSM CEP, Exercise Physiologist    Virtual Visit  No    Medication changes reported      No    Fall or balance concerns reported     No    Warm-up and Cool-down  Performed on first and last piece of equipment    Resistance Training Performed  Yes    VAD Patient?  No    PAD/SET Patient?  No      Pain Assessment   Currently in Pain?  No/denies          Social History   Tobacco Use  Smoking Status Former Smoker  . Packs/day: 0.25  . Years: 5.00  . Pack years: 1.25  . Types: Cigarettes  . Quit date: 15  . Years since quitting: 26.4  Smokeless Tobacco Never Used    Goals Met:  Independence with exercise equipment Exercise tolerated well No report of cardiac concerns or symptoms  Goals Unmet:  Not Applicable  Comments: Pt able to follow exercise prescription today without complaint.  Will continue to monitor for progression.    Dr. Emily Filbert is Medical Director for [place] and [name].

## 2020-01-04 ENCOUNTER — Encounter: Payer: No Typology Code available for payment source | Admitting: *Deleted

## 2020-01-04 ENCOUNTER — Other Ambulatory Visit: Payer: Self-pay

## 2020-01-04 DIAGNOSIS — Z951 Presence of aortocoronary bypass graft: Secondary | ICD-10-CM

## 2020-01-04 NOTE — Progress Notes (Signed)
Daily Session Note  Patient Details  Name: Brian Chapman MRN: [id number] Date of Birth: 11-07-1945 Referring Provider:     Cardiac Rehab from 10/18/2019 in [facility] Cardiac and Pulmonary Rehab  Referring Provider  [name]      Encounter Date: 01/04/2020  Check In: Session Check In - 01/04/20 0830      Check-In   Supervising physician immediately available to respond to emergencies  See telemetry face sheet for immediately available ER MD    Location  [facility]-Cardiac & Pulmonary Rehab    Staff Present  Heath Lark, RN, BSN, CCRP;Melissa Morovis RDN, LDN;Jessica [place], MA, RCEP, CCRP, CCET;Amanda Sommer, BA, ACSM CEP, Exercise Physiologist    Virtual Visit  No    Medication changes reported      No    Fall or balance concerns reported     No    Warm-up and Cool-down  Performed on first and last piece of equipment    Resistance Training Performed  Yes    VAD Patient?  No    PAD/SET Patient?  No      Pain Assessment   Currently in Pain?  No/denies          Social History   Tobacco Use  Smoking Status Former Smoker  . Packs/day: 0.25  . Years: 5.00  . Pack years: 1.25  . Types: Cigarettes  . Quit date: 64  . Years since quitting: 26.4  Smokeless Tobacco Never Used    Goals Met:  Independence with exercise equipment Exercise tolerated well No report of cardiac concerns or symptoms  Goals Unmet:  Not Applicable  Comments: Pt able to follow exercise prescription today without complaint.  Will continue to monitor for progression.    Dr. Emily Filbert is Medical Director for [place] [name].

## 2020-01-09 ENCOUNTER — Other Ambulatory Visit: Payer: Self-pay

## 2020-01-09 ENCOUNTER — Encounter: Payer: No Typology Code available for payment source | Attending: Cardiovascular Disease | Admitting: *Deleted

## 2020-01-09 DIAGNOSIS — Z951 Presence of aortocoronary bypass graft: Secondary | ICD-10-CM | POA: Diagnosis not present

## 2020-01-09 NOTE — Progress Notes (Signed)
Daily Session Note  Patient Details  Name: Brian Chapman MRN: [id number] Date of Birth: [birth year] Referring Provider:     Cardiac Rehab from 10/18/2019 in [facility] Cardiac and Pulmonary Rehab  Referring Provider  [name]      Encounter Date: 01/09/2020  Check In: Session Check In - 01/09/20 1130      Check-In   Supervising physician immediately available to respond to emergencies  See telemetry face sheet for immediately available ER MD    Location  [facility]-Cardiac & Pulmonary Rehab    Staff Present  Nyoka Cowden, RN, BSN, Ardeth Sportsman RDN, Rowe Pavy, BA, ACSM CEP, Exercise Physiologist    Virtual Visit  No    Fall or balance concerns reported     No    Tobacco Cessation  No Change    Warm-up and Cool-down  Performed on first and last piece of equipment    Resistance Training Performed  No    VAD Patient?  No      Pain Assessment   Currently in Pain?  No/denies          Social History   Tobacco Use  Smoking Status Former Smoker  . Packs/day: 0.25  . Years: 5.00  . Pack years: 1.25  . Types: Cigarettes  . Quit date: 21  . Years since quitting: 26.4  Smokeless Tobacco Never Used    Goals Met:  Proper associated with RPD/PD & O2 Sat Exercise tolerated well No report of cardiac concerns or symptoms  Goals Unmet:  Not Applicable  Comments: Pt able to follow exercise prescription today without complaint.  Will continue to monitor for progression.   Dr. Emily Filbert is Medical Director for [place] and [name].

## 2020-01-09 NOTE — Progress Notes (Signed)
Daily Session Note  Patient Details  Name: Brian Chapman MRN: [id number] Date of Birth: May 06, 1946 Referring Provider:     Cardiac Rehab from 10/18/2019 in [facility] Cardiac and Pulmonary Rehab  Referring Provider  Arida      Encounter Date: 01/09/2020  Check In:      Social History   Tobacco Use  Smoking Status Former Smoker  . Packs/day: 0.25  . Years: 5.00  . Pack years: 1.25  . Types: Cigarettes  . Quit date: 59  . Years since quitting: 26.4  Smokeless Tobacco Never Used    Goals Met:  Independence with exercise equipment Exercise tolerated well No report of cardiac concerns or symptoms  Goals Unmet:  Not Applicable  Comments: Pt able to follow exercise prescription today without complaint.  Will continue to monitor for progression.    Dr. Emily Filbert is Medical Director for [place] and [name].

## 2020-01-11 ENCOUNTER — Encounter: Payer: No Typology Code available for payment source | Admitting: *Deleted

## 2020-01-11 ENCOUNTER — Other Ambulatory Visit: Payer: Self-pay

## 2020-01-11 DIAGNOSIS — Z951 Presence of aortocoronary bypass graft: Secondary | ICD-10-CM

## 2020-01-11 NOTE — Progress Notes (Signed)
Daily Session Note  Patient Details  Name: Brian Chapman MRN: [id number] Date of Birth: 10-28-1945 Referring Provider:     Cardiac Rehab from 10/18/2019 in [facility] Cardiac and Pulmonary Rehab  Referring Provider  [name]      Encounter Date: 01/11/2020  Check In: Session Check In - 01/11/20 0942      Check-In   Supervising physician immediately available to respond to emergencies  See telemetry face sheet for immediately available ER MD    Location  [facility]-Cardiac & Pulmonary Rehab    Staff Present  Heath Lark, RN, BSN, CCRP;Melissa [place] RDN, Rowe Pavy, BA, ACSM CEP, Exercise Physiologist    Virtual Visit  Yes    Medication changes reported      No    Fall or balance concerns reported     No    Warm-up and Cool-down  Performed on first and last piece of equipment    Resistance Training Performed  Yes    VAD Patient?  No    PAD/SET Patient?  No      Pain Assessment   Currently in Pain?  No/denies          Social History   Tobacco Use  Smoking Status Former Smoker   Packs/day: 0.25   Years: 5.00   Pack years: 1.25   Types: Cigarettes   Quit date: 1995   Years since quitting: 26.4  Smokeless Tobacco Never Used    Goals Met:  Independence with exercise equipment Exercise tolerated well No report of cardiac concerns or symptoms  Goals Unmet:  Not Applicable  Comments: Pt able to follow exercise prescription today without complaint.  Will continue to monitor for progression.    Dr. Emily Filbert is Medical Director for [place] and [name].

## 2020-01-16 ENCOUNTER — Encounter: Payer: No Typology Code available for payment source | Admitting: *Deleted

## 2020-01-16 ENCOUNTER — Other Ambulatory Visit: Payer: Self-pay

## 2020-01-16 DIAGNOSIS — Z951 Presence of aortocoronary bypass graft: Secondary | ICD-10-CM

## 2020-01-16 NOTE — Progress Notes (Signed)
Daily Session Note  Patient Details  Name: Brian Chapman MRN: [id number] Date of Birth: [birth year] Referring Provider:     Cardiac Rehab from 10/18/2019 in [facility] Cardiac and Pulmonary Rehab  Referring Provider  [name]      Encounter Date: 01/16/2020  Check In: Session Check In - 01/16/20 0830      Check-In   Supervising physician immediately available to respond to emergencies  See telemetry face sheet for immediately available ER MD    Location  [facility]-Cardiac & Pulmonary Rehab    Staff Present  Hope Budds RDN, LDN;Joseph Hood RCP,RRT,BSRT;Heath Lark, RN, BSN, CCRP    Virtual Visit  No    Medication changes reported      No    Fall or balance concerns reported     No    Warm-up and Cool-down  Performed on first and last piece of equipment    Resistance Training Performed  Yes    VAD Patient?  No      Pain Assessment   Currently in Pain?  No/denies          Social History   Tobacco Use  Smoking Status Former Smoker  . Packs/day: 0.25  . Years: 5.00  . Pack years: 1.25  . Types: Cigarettes  . Quit date: 42  . Years since quitting: 26.4  Smokeless Tobacco Never Used    Goals Met:  Independence with exercise equipment Exercise tolerated well No report of cardiac concerns or symptoms  Goals Unmet:  Not Applicable  Comments: Pt able to follow exercise prescription today without complaint.  Will continue to monitor for progression.    Dr. Emily Filbert is Medical Director for [place] and [name].

## 2020-01-18 ENCOUNTER — Encounter: Payer: No Typology Code available for payment source | Admitting: *Deleted

## 2020-01-18 ENCOUNTER — Other Ambulatory Visit: Payer: Self-pay

## 2020-01-18 DIAGNOSIS — Z951 Presence of aortocoronary bypass graft: Secondary | ICD-10-CM

## 2020-01-18 NOTE — Progress Notes (Signed)
Daily Session Note  Patient Details  Name: Brian Chapman MRN: [id number] Date of Birth: 05/05/1946 Referring Provider:     Cardiac Rehab from 10/18/2019 in [facility] Cardiac and Pulmonary Rehab  Referring Provider [name]      Encounter Date: 01/18/2020  Check In:  Session Check In - 01/18/20 0945      Check-In   Supervising physician immediately available to respond to emergencies See telemetry face sheet for immediately available ER MD    Location [facility]-Cardiac & Pulmonary Rehab    Staff Present Heath Lark, RN, BSN, CCRP;Melissa [place] RDN, LDN;Jessica [place], MA, RCEP, CCRP, CCET    Virtual Visit No    Medication changes reported     No    Fall or balance concerns reported    No    Warm-up and Cool-down Performed on first and last piece of equipment    Resistance Training Performed Yes    VAD Patient? No    PAD/SET Patient? No      Pain Assessment   Currently in Pain? No/denies              Social History   Tobacco Use  Smoking Status Former Smoker  . Packs/day: 0.25  . Years: 5.00  . Pack years: 1.25  . Types: Cigarettes  . Quit date: 32  . Years since quitting: 26.4  Smokeless Tobacco Never Used    Goals Met:  Independence with exercise equipment Exercise tolerated well No report of cardiac concerns or symptoms  Goals Unmet:  Not Applicable  Comments: Pt able to follow exercise prescription today without complaint.  Will continue to monitor for progression.    Dr. Emily Filbert is Medical Director for [place] and [name].

## 2020-01-23 ENCOUNTER — Encounter: Payer: No Typology Code available for payment source | Admitting: *Deleted

## 2020-01-23 ENCOUNTER — Other Ambulatory Visit: Payer: Self-pay

## 2020-01-23 DIAGNOSIS — Z951 Presence of aortocoronary bypass graft: Secondary | ICD-10-CM | POA: Diagnosis not present

## 2020-01-23 NOTE — Progress Notes (Signed)
Daily Session Note  Patient Details  Name: Brian Chapman MRN: [id number] Date of Birth: May 14, 1946 Referring Provider:     Cardiac Rehab from 10/18/2019 in [facility] Cardiac and Pulmonary Rehab  Referring Provider [name]      Encounter Date: 01/23/2020  Check In:  Session Check In - 01/23/20 0947      Check-In   Supervising physician immediately available to respond to emergencies See telemetry face sheet for immediately available ER MD    Location [facility]-Cardiac & Pulmonary Rehab    Staff Present Heath Lark, RN, BSN, Lance Sell, BA, ACSM CEP, Exercise Physiologist;Joseph Hood RCP,RRT,BSRT    Virtual Visit No    Medication changes reported     No    Fall or balance concerns reported    No    Warm-up and Cool-down Performed on first and last piece of equipment    Resistance Training Performed Yes    VAD Patient? No    PAD/SET Patient? No      Pain Assessment   Currently in Pain? No/denies              Social History   Tobacco Use  Smoking Status Former Smoker  . Packs/day: 0.25  . Years: 5.00  . Pack years: 1.25  . Types: Cigarettes  . Quit date: 67  . Years since quitting: 26.4  Smokeless Tobacco Never Used    Goals Met:  Independence with exercise equipment Exercise tolerated well No report of cardiac concerns or symptoms  Goals Unmet:  Not Applicable  Comments: Pt able to follow exercise prescription today without complaint.  Will continue to monitor for progression.    Dr. Emily Filbert is Medical Director for [place] and [name].

## 2020-01-24 ENCOUNTER — Encounter: Payer: Self-pay | Admitting: *Deleted

## 2020-01-24 DIAGNOSIS — Z951 Presence of aortocoronary bypass graft: Secondary | ICD-10-CM

## 2020-01-24 NOTE — Progress Notes (Signed)
Cardiac Individual Treatment Plan  Patient Details  Name: Brian Chapman MRN: [id number] Date of Birth: Jan 27, 1946 Referring Provider:     Cardiac Rehab from 10/18/2019 in [facility] Cardiac and Pulmonary Rehab  Referring Provider Arida      Initial Encounter Date:    Cardiac Rehab from 10/18/2019 in [facility] Cardiac and Pulmonary Rehab  Date 10/18/19      Visit Diagnosis: S/P CABG x 4  Patient's Home Medications on Admission:  Current Outpatient Medications:  .  allopurinol (ZYLOPRIM) 300 MG tablet, Take 300 mg by mouth daily., Disp: , Rfl:  .  aspirin EC 81 MG tablet, Take 81 mg by mouth daily., Disp: , Rfl:  .  atorvastatin (LIPITOR) 80 MG tablet, Take 80 mg by mouth daily., Disp: , Rfl:  .  betamethasone valerate lotion (VALISONE) 0.1 %, Apply 1 application topically daily as needed for irritation., Disp: , Rfl:  .  chlorthalidone (HYGROTON) 25 MG tablet, Take 0.5 tablets (12.5 mg total) by mouth daily., Disp: 15 tablet, Rfl: 5 .  clopidogrel (PLAVIX) 75 MG tablet, Take 1 tablet (75 mg total) by mouth daily., Disp: 30 tablet, Rfl: 2 .  clopidogrel (PLAVIX) 75 MG tablet, Take 1 tablet (75 mg total) by mouth daily., Disp: 90 tablet, Rfl: 3 .  colchicine 0.6 MG tablet, Take 1 tablet (0.6 mg total) by mouth daily., Disp: 30 tablet, Rfl: 0 .  cyclobenzaprine (FLEXERIL) 5 MG tablet, Take 1 tablet (5 mg total) by mouth 3 (three) times daily as needed for muscle spasms., Disp: 15 tablet, Rfl: 0 .  desonide (DESOWEN) 0.05 % cream, Apply 1 application topically 2 (two) times daily., Disp: , Rfl:  .  diclofenac Sodium (VOLTAREN) 1 % GEL, Apply 4 g topically 4 (four) times daily., Disp: , Rfl:  .  glucose blood test strip, Test blood ac and hs...the patient has been instructed., Disp: 100 each, Rfl: 12 .  indomethacin (INDOCIN) 50 MG capsule, Take 50 mg by mouth daily as needed. Gout pain, Disp: , Rfl:  .  ketoconazole (NIZORAL) 2 % shampoo, Apply 1 application topically 2 (two) times a week.,  Disp: , Rfl:  .  Lancets (ONETOUCH ULTRASOFT) lancets, Use as instructed...test ac and hs., Disp: 100 each, Rfl: 12 .  metFORMIN (GLUCOPHAGE) 500 MG tablet, Take 500 mg by mouth daily with breakfast., Disp: , Rfl:  .  metoprolol tartrate (LOPRESSOR) 25 MG tablet, Take 12.5 mg by mouth 2 (two) times daily., Disp: , Rfl:  .  nitroGLYCERIN (NITROSTAT) 0.4 MG SL tablet, Place 1 tablet (0.4 mg total) under the tongue every 5 (five) minutes as needed for chest pain., Disp: 25 tablet, Rfl: 3  Past Medical History: Past Medical History:  Diagnosis Date  . CAD (coronary artery disease)    severe distal left main stenosis with plaque rupture, extending into the ostial LAD and occluded distal LAD, likely from embolization.  He had mild to moderate pRCA stenosis. He underwent POBA of distal LAD  . Diabetes mellitus without complication ([place])   . HFrEF (heart failure with reduced ejection fraction) (HCC)    2/6 TTE 45-50%, severe LVH, G1DD  . History of tobacco use   . Hyperlipidemia   . Hypertension   . OSA on CPAP    wears CPAP qHS per pt  . PAC (premature atrial contraction)    PAC verus post cabg AF on amiodarone s/p cath. Not on [place].  . S/P CABG x 4    2/7 LIMA-LAD, SVG-diagonal, SVG-OM1  to OM2    Tobacco Use: Social History   Tobacco Use  Smoking Status Former Smoker  . Packs/day: 0.25  . Years: 5.00  . Pack years: 1.25  . Types: Cigarettes  . Quit date: 14  . Years since quitting: 26.4  Smokeless Tobacco Never Used    Labs: Recent Chemical engineer    Labs for ITP Cardiac and Pulmonary Rehab Latest Ref Rng & Units 09/18/2019 09/18/2019 09/20/2019 09/21/2019 11/29/2019   Cholestrol 100 - 199 mg/dL - - - - 94(L)   LDLCALC 0 - 99 mg/dL - - - - 40   HDL >39 mg/dL - - - - 34(L)   Trlycerides 0 - 149 mg/dL - - - - 109   Hemoglobin A1c 4.8 - 5.6 % - - 6.5(H) 6.6(H) -   PHART 7.35 - 7.45 7.320(L) 7.302(L) - - -   PCO2ART 32 - 48 mmHg 42.0 44.7 - - -   HCO3 20.0 - 28.0 mmol/L 21.3  21.7 - - -   TCO2 22 - 32 mmol/L 22 23 - - -   ACIDBASEDEF 0.0 - 2.0 mmol/L 4.0(H) 4.0(H) - - -   O2SAT % 95.0 92.0 - - -       Exercise Target Goals: Exercise Program Goal: Individual exercise prescription set using results from initial 6 min walk test and THRR while considering  patient's activity barriers and safety.   Exercise Prescription Goal: Initial exercise prescription builds to 30-45 minutes a day of aerobic activity, 2-3 days per week.  Home exercise guidelines will be given to patient during program as part of exercise prescription that the participant will acknowledge.   Education: Aerobic Exercise & Resistance Training: - Gives group verbal and written instruction on the various components of exercise. Focuses on aerobic and resistive training programs and the benefits of this training and how to safely progress through these programs..   Education: Exercise & Equipment Safety: - Individual verbal instruction and demonstration of equipment use and safety with use of the equipment.   Cardiac Rehab from 10/18/2019 in [facility] Cardiac and Pulmonary Rehab  Date 10/18/19  Educator AS  Instruction Review Code 1- Verbalizes Understanding      Education: Exercise Physiology & General Exercise Guidelines: - Group verbal and written instruction with models to review the exercise physiology of the cardiovascular system and associated critical values. Provides general exercise guidelines with specific guidelines to those with heart or lung disease.    Education: Flexibility, Balance, Mind/Body Relaxation: Provides group verbal/written instruction on the benefits of flexibility and balance training, including mind/body exercise modes such as yoga, pilates and tai chi.  Demonstration and skill practice provided.   Activity Barriers & Risk Stratification:   6 Minute Walk:  6 Minute Walk    Row Name 10/18/19 1308         6 Minute Walk   Phase Initial     Distance 1172 feet      Walk Time 6 minutes     # of Rest Breaks 0     MPH 2.22     METS 2.22     RPE 7     Perceived Dyspnea  0     VO2 Peak 7.8     Symptoms No     Resting HR 60 bpm     Resting BP 112/60     Resting Oxygen Saturation  100 %     Exercise Oxygen Saturation  during 6 min walk 98 %  Max Ex. HR 91 bpm     Max Ex. BP 146/64     2 Minute Post BP 120/66            Oxygen Initial Assessment:   Oxygen Re-Evaluation:   Oxygen Discharge (Final Oxygen Re-Evaluation):   Initial Exercise Prescription:  Initial Exercise Prescription - 10/18/19 1300      Date of Initial Exercise RX and Referring Provider   Date 10/18/19    Referring Provider Arida      Treadmill   MPH 2    Grade 0    Minutes 15    METs 2.5      Recumbant Bike   Level 2    RPM 60    Minutes 15    METs 2.2      Arm Ergometer   Level 1    RPM 30    Minutes 15    METs 2.2      REL-XR   Level 2    Speed 50    Minutes 15    METs 2.2      Prescription Details   Frequency (times per week) 3    Duration Progress to 30 minutes of continuous aerobic without signs/symptoms of physical distress      Intensity   THRR 40-80% of Max Heartrate 95-130    Ratings of Perceived Exertion 11-13    Perceived Dyspnea 0-4      Resistance Training   Training Prescription Yes    Weight 3 lb    Reps 10-15           Perform Capillary Blood Glucose checks as needed.  Exercise Prescription Changes:  Exercise Prescription Changes    Row Name 10/18/19 1300 10/24/19 1100 11/06/19 1300 11/16/19 1300 11/21/19 1300     Response to Exercise   Blood Pressure (Admit) 112/60 124/60 122/70 -- 130/68   Blood Pressure (Exercise) 146/64 138/70 144/74 -- 122/62   Blood Pressure (Exit) 120/66 125/58 124/70 -- 106/58   Heart Rate (Admit) 60 bpm 78 bpm 66 bpm -- 40 bpm   Heart Rate (Exercise) 91 bpm 93 bpm 100 bpm -- 117 bpm   Heart Rate (Exit) 67 bpm 71 bpm 65 bpm -- 57 bpm   Oxygen Saturation (Admit) 100 % -- -- -- --    Oxygen Saturation (Exercise) 98 % -- -- -- --   Rating of Perceived Exertion (Exercise) _0 -- 12   Perceived Dyspnea (Exercise) 0 -- -- -- --   Symptoms none none none -- none   Duration -- Continue with 30 min of aerobic exercise without signs/symptoms of physical distress. Continue with 30 min of aerobic exercise without signs/symptoms of physical distress. -- Continue with 30 min of aerobic exercise without signs/symptoms of physical distress.   Intensity -- THRR unchanged THRR unchanged -- THRR unchanged     Progression   Progression -- -- Continue to progress workloads to maintain intensity without signs/symptoms of physical distress. -- Continue to progress workloads to maintain intensity without signs/symptoms of physical distress.   Average METs -- -- 2.49 -- 2.6     Resistance Training   Training Prescription -- Yes Yes -- Yes   Weight -- 3 lb 3 lb -- 4 lb   Reps -- 10-15 10-15 -- 10-15     Interval Training   Interval Training -- No No -- No     Treadmill   MPH -- 2 2 -- 1.8   Grade -- 0  0.5 -- 0   Minutes -- 15 15 -- 15   METs -- 2.5 2.67 -- 2.38     Recumbant Bike   Level -- -- 2 -- --   Minutes -- -- 15 -- --     Arm Ergometer   Level -- -- 1 -- --   Minutes -- -- 15 -- --   METs -- -- 2.3 -- --     Elliptical   Level -- 1 -- -- --   Minutes -- 15 -- -- --     REL-XR   Level -- -- -- -- 2   Speed -- -- -- -- 50   Minutes -- -- -- -- 15   METs -- -- -- -- 2.8     Home Exercise Plan   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 Add 2 additional days to program exercise sessions.   Initial Home Exercises Provided -- -- -- 11/16/19 11/16/19   Row Name 12/04/19 1500 12/12/19 1600 12/25/19 1600 01/11/20 0900       Response to Exercise   Blood Pressure (Admit) 134/66 120/72 142/78 136/70    Blood Pressure (Exercise) 124/64 128/80  130/74 132/66    Blood Pressure (Exit) 126/64 112/64 122/62 114/66    Heart Rate (Admit) 61 bpm 66 bpm 63 bpm 81 bpm    Heart Rate (Exercise) 93 bpm 71 bpm 96 bpm 97 bpm    Heart Rate (Exit) 66 bpm 68 bpm 62 bpm 65 bpm    Rating of Perceived Exertion (Exercise) _0 Symptoms none none none none    Duration Continue with 30 min of aerobic exercise without signs/symptoms of physical distress. Continue with 30 min of aerobic exercise without signs/symptoms of physical distress. Continue with 30 min of aerobic exercise without signs/symptoms of physical distress. Continue with 30 min of aerobic exercise without signs/symptoms of physical distress.    Intensity THRR unchanged THRR unchanged THRR unchanged THRR unchanged      Progression   Progression Continue to progress workloads to maintain intensity without signs/symptoms of physical distress. Continue to progress workloads to maintain intensity without signs/symptoms of physical distress. Continue to progress workloads to maintain intensity without signs/symptoms of physical distress. Continue to progress workloads to maintain intensity without signs/symptoms of physical distress.    Average METs 2.29 2.5 2.68 2.4      Resistance Training   Training Prescription Yes Yes Yes Yes    Weight 4 lb 4 lb 4 lb 4 lb    Reps 10-15 10-15 10-15 10-15      Interval Training   Interval Training No -- No --      Treadmill   MPH 2 -- 2.2 2    Grade 0.5 -- 0.5 0.5    Minutes 15 -- 15 15    METs 2.67 -- 2.84 2.67      Recumbant Bike   Level _1 --    RPM -- 60 -- --    Minutes _2 --    METs -- 2.85 3.2 --      NuStep   Level -- -- 1 --    Minutes -- -- 15 --    METs -- -- 2.8 --      Arm Ergometer   Level 2 2 -- --    RPM --  30 -- --    Minutes 15 15 -- --    METs 2.1 2.2 -- --      REL-XR   Level 1 -- 1 --    Minutes 15 -- 15 --    METs 2.1 -- -- --      T5 Nustep   Level -- -- 1 3    SPM -- -- -- 80    Minutes --  -- 15 15    METs -- -- 1.9 1.9      Home Exercise Plan   Plans to continue exercise at [employer]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 -- Add 2 additional days to program exercise sessions. Add 2 additional days to program exercise sessions.    Initial Home Exercises Provided 11/16/19 -- 11/16/19 11/16/19           Exercise Comments:  Exercise Comments    Row Name 10/24/19 0831 11/21/19 0802         Exercise Comments First full day of exercise!  Patient was oriented to gym and equipment including functions, settings, policies, and procedures.  Patient's individual exercise prescription and treatment plan were reviewed.  All starting workloads were established based on the results of the 6 minute walk test done at initial orientation visit.  The plan for exercise progression was also introduced and progression will be customized based on patient's performance and goals. Brian Chapman arrived to tell staff he was feeling very fatigued. He received his COVID Vaccine yesterday.  Sent him home to rest, no session today             Exercise Goals and Review:  Exercise Goals    Row Name 10/18/19 1317             Exercise Goals   Increase Physical Activity Yes       Intervention Provide advice, education, support and counseling about physical activity/exercise needs.;Develop an individualized exercise prescription for aerobic and resistive training based on initial evaluation findings, risk stratification, comorbidities and participant's personal goals.       Expected Outcomes Short Term: Attend rehab on a regular basis to increase amount of physical activity.;Long Term: Add in home exercise to make exercise part of routine and to increase amount of physical activity.;Long Term: Exercising regularly at least 3-5 days a week.       Increase Strength and Stamina Yes       Intervention Provide  advice, education, support and counseling about physical activity/exercise needs.;Develop an individualized exercise prescription for aerobic and resistive training based on initial evaluation findings, risk stratification, comorbidities and participant's personal goals.       Expected Outcomes Short Term: Increase workloads from initial exercise prescription for resistance, speed, and METs.;Short Term: Perform resistance training exercises routinely during rehab and add in resistance training at home;Long Term: Improve cardiorespiratory fitness, muscular endurance and strength as measured by increased METs and functional capacity (6MWT)       Able to understand and use rate of perceived exertion (RPE) scale Yes       Intervention Provide education and explanation on how to use RPE scale       Expected Outcomes Short Term: Able to use RPE daily in rehab to express subjective intensity level;Long Term:  Able to use RPE to guide intensity level when exercising independently       Able to understand and use Dyspnea scale  Yes       Intervention Provide education and explanation on how to use Dyspnea scale       Expected Outcomes Short Term: Able to use Dyspnea scale daily in rehab to express subjective sense of shortness of breath during exertion;Long Term: Able to use Dyspnea scale to guide intensity level when exercising independently       Knowledge and understanding of Target Heart Rate Range (THRR) Yes       Intervention Provide education and explanation of THRR including how the numbers were predicted and where they are located for reference       Expected Outcomes Short Term: Able to state/look up THRR;Short Term: Able to use daily as guideline for intensity in rehab;Long Term: Able to use THRR to govern intensity when exercising independently       Able to check pulse independently Yes       Intervention Provide education and demonstration on how to check pulse in carotid and radial arteries.;Review  the importance of being able to check your own pulse for safety during independent exercise       Expected Outcomes Short Term: Able to explain why pulse checking is important during independent exercise;Long Term: Able to check pulse independently and accurately       Understanding of Exercise Prescription Yes       Intervention Provide education, explanation, and written materials on patient's individual exercise prescription       Expected Outcomes Short Term: Able to explain program exercise prescription;Long Term: Able to explain home exercise prescription to exercise independently              Exercise Goals Re-Evaluation :  Exercise Goals Re-Evaluation    Row Name 10/24/19 0831 11/06/19 1343 11/16/19 1309 11/21/19 1342 12/04/19 1544     Exercise Goal Re-Evaluation   Exercise Goals Review Able to understand and use rate of perceived exertion (RPE) scale;Knowledge and understanding of Target Heart Rate Range (THRR);Understanding of Exercise Prescription Increase Physical Activity;Increase Strength and Stamina;Understanding of Exercise Prescription Increase Physical Activity;Increase Strength and Stamina;Able to understand and use rate of perceived exertion (RPE) scale;Able to understand and use Dyspnea scale;Knowledge and understanding of Target Heart Rate Range (THRR);Able to check pulse independently;Understanding of Exercise Prescription Increase Physical Activity;Increase Strength and Stamina;Able to understand and use rate of perceived exertion (RPE) scale;Able to understand and use Dyspnea scale;Knowledge and understanding of Target Heart Rate Range (THRR);Able to check pulse independently;Understanding of Exercise Prescription Increase Physical Activity;Increase Strength and Stamina;Understanding of Exercise Prescription   Comments Reviewed RPE scale, THR and program prescription with pt today.  Pt voiced understanding and was given a copy of goals to take home. Brian Chapman is off to a good  start in rehab.  He has completed his first four full days of exercise.  We will continue to monitor his progress. eviewed home exercise with pt today.  Pt plans to go to [employer] for exercise.  Reviewed THR, pulse, RPE, sign and symptoms, NTG use, and when to call 911 or MD.  Also discussed weather considerations and indoor options.  Pt voiced understanding. Brian Chapman has moved up to 4 lb for strength work.  He has a Higher education careers adviser at [employer] and plans to exercise there on weekends. Brian Chapman is doing well in rehab.  He is now on level 2 for the arm crank. We will continue to monitor his progress.   Expected Outcomes Short: Use RPE daily to regulate intensity. Long: Follow program prescription in THR.  Short: Continue to attend regularly  Long: Continue to follow program prescription. Short : exercsie 1-2 days outside HT LOng: maintain exercise on his own Short: add in exercise on weekends Long: increase stamina Short: Increase workload on XR  Long: Continue to improve stamina   Row Name 12/12/19 0815 12/25/19 1621 01/09/20 0748         Exercise Goal Re-Evaluation   Exercise Goals Review Increase Physical Activity;Increase Strength and Stamina;Able to understand and use rate of perceived exertion (RPE) scale;Able to understand and use Dyspnea scale;Knowledge and understanding of Target Heart Rate Range (THRR);Able to check pulse independently;Understanding of Exercise Prescription Increase Physical Activity;Increase Strength and Stamina;Understanding of Exercise Prescription Increase Physical Activity;Increase Strength and Stamina;Understanding of Exercise Prescription     Comments We discussed adding on emore day of exercise on weekends. Brian Chapman is doing well in rehab.  He is out this week for vacation on a trout fishing trip.  He was looking forward to it and hopes to get in some walking as well.  He is up to 2.2 mph on the treadmill.  We will continue to monitor his progress. Brian Chapman works 8-10 hours a day  and walks a lot at work.  He is tired by the end of the day.  He plans to go to [employer] before work when he finishes HT.     Expected Outcomes Short:  add one day of exercise on weekends Long: exercise 3-5 days per week Short: Walk while on vacation  Long: Continue to improve stamina Short:  add exercise on weekends Long: maintian exercise on his own            Discharge Exercise Prescription (Final Exercise Prescription Changes):  Exercise Prescription Changes - 01/11/20 0900      Response to Exercise   Blood Pressure (Admit) 136/70    Blood Pressure (Exercise) 132/66    Blood Pressure (Exit) 114/66    Heart Rate (Admit) 81 bpm    Heart Rate (Exercise) 97 bpm    Heart Rate (Exit) 65 bpm    Rating of Perceived Exertion (Exercise) 13    Symptoms none    Duration Continue with 30 min of aerobic exercise without signs/symptoms of physical distress.    Intensity THRR unchanged      Progression   Progression Continue to progress workloads to maintain intensity without signs/symptoms of physical distress.    Average METs 2.4      Resistance Training   Training Prescription Yes    Weight 4 lb    Reps 10-15      Treadmill   MPH 2    Grade 0.5    Minutes 15    METs 2.67      T5 Nustep   Level 3    SPM 80    Minutes 15    METs 1.9      Home Exercise Plan   Plans to continue exercise at [employer] (comment)    Frequency Add 2 additional days to program exercise sessions.    Initial Home Exercises Provided 11/16/19           Nutrition:  Target Goals: Understanding of nutrition guidelines, daily intake of sodium '1500mg'$ , cholesterol '200mg'$ , calories 30% from fat and 7% or less from saturated fats, daily to have 5 or more servings of fruits and vegetables.  Education: Controlling Sodium/Reading Food Labels -Group verbal and written material supporting the discussion of sodium use in heart healthy nutrition. Review and explanation with models,  verbal and  written materials for utilization of the food label.   Education: General Nutrition Guidelines/Fats and Fiber: -Group instruction provided by verbal, written material, models and posters to present the general guidelines for heart healthy nutrition. Gives an explanation and review of dietary fats and fiber.   Biometrics:  Pre Biometrics - 10/18/19 1318      Pre Biometrics   Height _0  (1.676 m)    Weight 201 lb 14.4 oz (91.6 kg)    BMI (Calculated) 32.6            Nutrition Therapy Plan and Nutrition Goals:  Nutrition Therapy & Goals - 11/20/19 0701      Nutrition Therapy   Diet HH, Low Na    Protein (specify units) 75-80g    Fiber 30 grams    Whole Grain Foods 3 servings    Saturated Fats 12 max. grams    Fruits and Vegetables 5 servings/day    Sodium 1.5 grams      Personal Nutrition Goals   Nutrition Goal ST: continue current diet LT: enhance longevity    Comments awake 330-430am B: coffee, toast or english muffin with butter, jelly, or peanut butter B2: coffee (sometimes) Lor S: sandwich ([place] on wrap) or some peanut butter crackers D: wife cooks or leftovers. Meat, vegetables. Pt reports weight stable for 30 years. Pt reports for snacks will also have trail mix and fruit. Pt reports having a variety of vegetables and using HH methods of cooking. Suggested whole grains for breads and wraps. Discussed HH eating. Pt reports eating intuitively.      Intervention Plan   Intervention Prescribe, educate and counsel regarding individualized specific dietary modifications aiming towards targeted core components such as weight, hypertension, lipid management, diabetes, heart failure and other comorbidities.;Nutrition handout(s) given to patient.    Expected Outcomes Short Term Goal: Understand basic principles of dietary content, such as calories, fat, sodium, cholesterol and nutrients.;Short Term Goal: A plan has been developed with personal nutrition goals set during dietitian  appointment.;Long Term Goal: Adherence to prescribed nutrition plan.           Nutrition Assessments:  Nutrition Assessments - 10/19/19 1124      MEDFICTS Scores   Pre Score 99           MEDIFICTS Score Key:          ?70 Need to make dietary changes          40-70 Heart Healthy Diet         ? 40 Therapeutic Level Cholesterol Diet  Nutrition Goals Re-Evaluation:  Nutrition Goals Re-Evaluation    [place] Name 12/19/19 0815             Goals   Nutrition Goal ST: continue current diet LT: enhance longevity       Comment Continue with current changes       Expected Outcome ST: continue current diet LT: enhance longevity              Nutrition Goals Discharge (Final Nutrition Goals Re-Evaluation):  Nutrition Goals Re-Evaluation - 12/19/19 0815      Goals   Nutrition Goal ST: continue current diet LT: enhance longevity    Comment Continue with current changes    Expected Outcome ST: continue current diet LT: enhance longevity           Psychosocial: Target Goals: Acknowledge presence or absence of significant depression and/or stress, maximize coping skills, provide positive support system. Participant  is able to verbalize types and ability to use techniques and skills needed for reducing stress and depression.   Education: Depression - Provides group verbal and written instruction on the correlation between heart/lung disease and depressed mood, treatment options, and the stigmas associated with seeking treatment.   Education: Sleep Hygiene -Provides group verbal and written instruction about how sleep can affect your health.  Define sleep hygiene, discuss sleep cycles and impact of sleep habits. Review good sleep hygiene tips.     Education: Stress and Anxiety: - Provides group verbal and written instruction about the health risks of elevated stress and causes of high stress.  Discuss the correlation between heart/lung disease and anxiety and treatment options.  Review healthy ways to manage with stress and anxiety.    Initial Review & Psychosocial Screening:  Initial Psych Review & Screening - 10/17/19 1311      Initial Review   Current issues with Current Sleep Concerns      Family Dynamics   Good Support System? Yes    Comments His incision is still tender which is making it hard to sleep. He can look to his wife for support.      Barriers   Psychosocial barriers to participate in program There are no identifiable barriers or psychosocial needs.      Screening Interventions   Interventions Provide feedback about the scores to participant;Encouraged to exercise;Program counselor consult;To provide support and resources with identified psychosocial needs    Expected Outcomes Short Term goal: Utilizing psychosocial counselor, staff and physician to assist with identification of specific Stressors or current issues interfering with healing process. Setting desired goal for each stressor or current issue identified.;Long Term Goal: Stressors or current issues are controlled or eliminated.;Short Term goal: Identification and review with participant of any Quality of Life or Depression concerns found by scoring the questionnaire.;Long Term goal: The participant improves quality of Life and PHQ9 Scores as seen by post scores and/or verbalization of changes           Quality of Life Scores:   Quality of Life - 10/18/19 1320      Quality of Life   Select Quality of Life      Quality of Life Scores   Health/Function Pre 28.8 %    Socioeconomic Pre 29.14 %    Psych/Spiritual Post 30 %    Family Pre 30 %          Scores of 19 and below usually indicate a poorer quality of life in these areas.  A difference of  2-3 points is a clinically meaningful difference.  A difference of 2-3 points in the total score of the Quality of Life Index has been associated with significant improvement in overall quality of life, self-image, physical symptoms, and  general health in studies assessing change in quality of life.  PHQ-9: Recent Review Flowsheet Data    Depression screen [facility] 2/9 11/28/2019 10/18/2019   Decreased Interest 0 1   Down, Depressed, Hopeless 0 1   PHQ - 2 Score 0 2   Altered sleeping 1 1   Tired, decreased energy 1 1   Change in appetite 0 1   Feeling bad or failure about yourself  0 0   Trouble concentrating 0 1   Moving slowly or fidgety/restless 0 1   Suicidal thoughts 0 0   PHQ-9 Score 2 7   Difficult doing work/chores Not difficult at all  Somewhat difficult     Interpretation of  Total Score  Total Score Depression Severity:  1-4 = Minimal depression, 5-9 = Mild depression, 10-14 = Moderate depression, 15-19 = Moderately severe depression, 20-27 = Severe depression   Psychosocial Evaluation and Intervention:  Psychosocial Evaluation - 10/17/19 1312      Psychosocial Evaluation & Interventions   Interventions Encouraged to exercise with the program and follow exercise prescription    Comments His incision is still tender which is making it hard to sleep. He can look to his wife for support.    Expected Outcomes Short: Attend [facility] stress management education to decrease stress. Long: Maintain exercise Post Heart[facility] to keep stress at a minimum.    Continue Psychosocial Services  Follow up required by staff           Psychosocial Re-Evaluation:  Psychosocial Re-Evaluation    Row Name 11/23/19 [phone] 12/12/19 0812 01/09/20 0751         Psychosocial Re-Evaluation   Current issues with Current Sleep Concerns;Current Stress Concerns Current Sleep Concerns;Current Stress Concerns --     Comments His incision is feeling better so he has been sleeping better.  He feels everything is moving in the right direction. Brian Chapman is sleeping well and feeels fine.  He got second Covid vaccine yesterday. No changes in stress or sleep     Expected Outcomes Short: continue good sleep patterns Long: maintain positive outlook  Short:  continue good sleep patterns Long: maintain positive outlook Short: maintain good sleep patterns Long: stay positive            Psychosocial Discharge (Final Psychosocial Re-Evaluation):  Psychosocial Re-Evaluation - 01/09/20 0751      Psychosocial Re-Evaluation   Comments No changes in stress or sleep    Expected Outcomes Short: maintain good sleep patterns Long: stay positive           Vocational Rehabilitation: Provide vocational rehab assistance to qualifying candidates.   Vocational Rehab Evaluation & Intervention:   Education: Education Goals: Education classes will be provided on a variety of topics geared toward better understanding of heart health and risk factor modification. Participant will state understanding/return demonstration of topics presented as noted by education test scores.  Learning Barriers/Preferences:  Learning Barriers/Preferences - 10/17/19 1313      Learning Barriers/Preferences   Learning Barriers None    Learning Preferences None           General Cardiac Education Topics:  AED/CPR: - Group verbal and written instruction with the use of models to demonstrate the basic use of the AED with the basic ABC's of resuscitation.   Anatomy & Physiology of the Heart: - Group verbal and written instruction and models provide basic cardiac anatomy and physiology, with the coronary electrical and arterial systems. Review of Valvular disease and Heart Failure   Cardiac Procedures: - Group verbal and written instruction to review commonly prescribed medications for heart disease. Reviews the medication, class of the drug, and side effects. Includes the steps to properly store meds and maintain the prescription regimen. (beta blockers and nitrates)   Cardiac Medications I: - Group verbal and written instruction to review commonly prescribed medications for heart disease. Reviews the medication, class of the drug, and side effects. Includes  the steps to properly store meds and maintain the prescription regimen.   Cardiac Medications II: -Group verbal and written instruction to review commonly prescribed medications for heart disease. Reviews the medication, class of the drug, and side effects. (all other drug classes)    Go Sex-Intimacy &  Heart Disease, Get SMART - Goal Setting: - Group verbal and written instruction through game format to discuss heart disease and the return to sexual intimacy. Provides group verbal and written material to discuss and apply goal setting through the application of the S.M.A.R.T. Method.   Other Matters of the Heart: - Provides group verbal, written materials and models to describe Stable Angina and Peripheral Artery. Includes description of the disease process and treatment options available to the cardiac patient.   Infection Prevention: - Provides verbal and written material to individual with discussion of infection control including proper hand washing and proper equipment cleaning during exercise session.   Cardiac Rehab from 10/18/2019 in [facility] Cardiac and Pulmonary Rehab  Date 10/18/19  Educator AS  Instruction Review Code 1- Verbalizes Understanding      Falls Prevention: - Provides verbal and written material to individual with discussion of falls prevention and safety.   Cardiac Rehab from 10/18/2019 in [facility] Cardiac and Pulmonary Rehab  Date 10/18/19  Educator AS  Instruction Review Code 1- Verbalizes Understanding      Other: -Provides group and verbal instruction on various topics (see comments)   Knowledge Questionnaire Score:  Knowledge Questionnaire Score - 10/18/19 1320      Knowledge Questionnaire Score   Pre Score 24/26           Core Components/Risk Factors/Patient Goals at Admission:  Personal Goals and Risk Factors at Admission - 10/18/19 1319      Core Components/Risk Factors/Patient Goals on Admission    Weight Management Yes;Weight  Maintenance;Weight Loss    Admit Weight 201 lb 14.4 oz (91.6 kg)    Goal Weight: Short Term 190 lb (86.2 kg)    Goal Weight: Long Term 180 lb (81.6 kg)    Expected Outcomes Short Term: Continue to assess and modify interventions until short term weight is achieved;Long Term: Adherence to nutrition and physical activity/exercise program aimed toward attainment of established weight goal;Weight Loss: Understanding of general recommendations for a balanced deficit meal plan, which promotes 1-2 lb weight loss per week and includes a negative energy balance of [phone] kcal/d;Understanding recommendations for meals to include 15-35% energy as protein, 25-35% energy from fat, 35-60% energy from carbohydrates, less than 258m of dietary cholesterol, 20-35 gm of total fiber daily;Understanding of distribution of calorie intake throughout the day with the consumption of 4-5 meals/snacks    Diabetes Yes    Intervention Provide education about signs/symptoms and action to take for hypo/hyperglycemia.;Provide education about proper nutrition, including hydration, and aerobic/resistive exercise prescription along with prescribed medications to achieve blood glucose in normal ranges: Fasting glucose 65-99 mg/dL    Expected Outcomes Short Term: Participant verbalizes understanding of the signs/symptoms and immediate care of hyper/hypoglycemia, proper foot care and importance of medication, aerobic/resistive exercise and nutrition plan for blood glucose control.;Long Term: Attainment of HbA1C < 7%.           Education:Diabetes - Individual verbal and written instruction to review signs/symptoms of diabetes, desired ranges of glucose level fasting, after meals and with exercise. Acknowledge that pre and post exercise glucose checks will be done for 3 sessions at entry of program.   [facility]  Date 10/18/19  Educator AS  Instruction Review Code 1- Verbalizes  Understanding      Education: Know Your Numbers and Risk Factors: -Group verbal and written instruction about important numbers in your health.  Discussion of what are risk factors and how  they play a role in the disease process.  Review of Cholesterol, Blood Pressure, Diabetes, and BMI and the role they play in your overall health.   Core Components/Risk Factors/Patient Goals Review:   Goals and Risk Factor Review    Row Name 11/23/19 0748 12/12/19 0809 01/09/20 0745         Core Components/Risk Factors/Patient Goals Review   Personal Goals Review Weight Management/Obesity;Hypertension;Lipids;Diabetes Weight Management/Obesity;Hypertension;Lipids;Diabetes Weight Management/Obesity;Hypertension;Lipids;Diabetes     Review Brian Chapman is taking all meds as directed.  He takes his BP a couple times per week.  He doesn't regularly check BG at home.  He feels more limbered up by coming to exercise.  He likes the routine of the program. Brian Chapman says his weight fluctuates on our scale.  He is taking meds as directed.  He walks a lot at work but hasnt exercised outside program sessions.  He checks BP every night and it has been fine. Brian Chapman continues to check BP at home.  He is taking all meds as directed.  He hasnt been able to exercise outside HT sessions.  We reviewed THR range/RPE.     Expected Outcomes Short : check with Dr about how often to monitor BG Long:  manage risk factors Short: continue to monitor risk factos Long: keep BP/BG in acceptable range Short:  continue to monitor risk factors Long:  maintain heart health long term            Core Components/Risk Factors/Patient Goals at Discharge (Final Review):   Goals and Risk Factor Review - 01/09/20 0745      Core Components/Risk Factors/Patient Goals Review   Personal Goals Review Weight Management/Obesity;Hypertension;Lipids;Diabetes    Review Brian Chapman continues to check BP at home.  He is taking all meds as directed.  He hasnt been able to  exercise outside HT sessions.  We reviewed THR range/RPE.    Expected Outcomes Short:  continue to monitor risk factors Long:  maintain heart health long term           ITP Comments:  ITP Comments    Row Name 10/17/19 1310 10/24/19 0831 11/01/19 0608 11/20/19 0717 11/21/19 0757   ITP Comments Virtual Orientation performed. Patient informed when to come in for RD and EP orientation. Diagnosis can be found in [facility] 09/17/2019 First full day of exercise!  Patient was oriented to gym and equipment including functions, settings, policies, and procedures.  Patient's individual exercise prescription and treatment plan were reviewed.  All starting workloads were established based on the results of the 6 minute walk test done at initial orientation visit.  The plan for exercise progression was also introduced and progression will be customized based on patient's performance and goals. 30 day chart review completed. ITP sent to Dr Zachery Dakins Medical Director, for review,changes as needed and signature. Continue with ITP if no changes requested Completed Initial RD Alberteen Spindle arrived to tell staff he was feeling very fatigued. He received his COVID Vaccine yesterday.  Sent him home to rest, no session today   [place] Name 11/29/19 0540 12/25/19 1621 12/27/19 0544 01/24/20 0523     ITP Comments 30 Day review completed. Medical Director review done, changes made as directed,and approval shown by signature of Market researcher. Brian Chapman is out for week trout fishing in [place]. 30 Day review completed. ITP review done, changes made as directed,and approval shown by signature of  Scientist, research (life sciences). 30 Day review completed. Medical Director ITP review done, changes made as directed,  and signed approval by Medical Director.           Comments: 30 Day review completed. Medical Director ITP review done, changes made as directed, and signed approval by Medical Director.

## 2020-01-25 ENCOUNTER — Other Ambulatory Visit: Payer: Self-pay

## 2020-01-25 ENCOUNTER — Encounter: Payer: No Typology Code available for payment source | Admitting: *Deleted

## 2020-01-25 DIAGNOSIS — Z951 Presence of aortocoronary bypass graft: Secondary | ICD-10-CM | POA: Diagnosis not present

## 2020-01-25 DIAGNOSIS — G4733 Obstructive sleep apnea (adult) (pediatric): Secondary | ICD-10-CM | POA: Diagnosis not present

## 2020-01-25 NOTE — Progress Notes (Signed)
Daily Session Note  Patient Details  Name: CHEVELLE DURR MRN: [id number] Date of Birth: 23-Nov-1945 Referring Provider:     Cardiac Rehab from 10/18/2019 in [facility] Cardiac and Pulmonary Rehab  Referring Provider [name]      Encounter Date: 01/25/2020  Check In:  Session Check In - 01/25/20 0841      Check-In   Supervising physician immediately available to respond to emergencies See telemetry face sheet for immediately available ER MD    Location [facility]-Cardiac & Pulmonary Rehab    Staff Present Heath Lark, RN, BSN, CCRP;Melissa Anchor RDN, Rowe Pavy, BA, ACSM CEP, Exercise Physiologist    Virtual Visit No    Medication changes reported     No    Fall or balance concerns reported    No    Warm-up and Cool-down Performed on first and last piece of equipment    Resistance Training Performed Yes    VAD Patient? No    PAD/SET Patient? No      Pain Assessment   Currently in Pain? No/denies              Social History   Tobacco Use  Smoking Status Former Smoker  . Packs/day: 0.25  . Years: 5.00  . Pack years: 1.25  . Types: Cigarettes  . Quit date: 7  . Years since quitting: 26.4  Smokeless Tobacco Never Used    Goals Met:  Independence with exercise equipment Exercise tolerated well No report of cardiac concerns or symptoms  Goals Unmet:  Not Applicable  Comments: Pt able to follow exercise prescription today without complaint.  Will continue to monitor for progression.    Dr. Emily Filbert is Medical Director for [place] and [name].

## 2020-01-30 ENCOUNTER — Encounter: Payer: No Typology Code available for payment source | Admitting: *Deleted

## 2020-01-30 ENCOUNTER — Other Ambulatory Visit: Payer: Self-pay

## 2020-01-30 DIAGNOSIS — Z951 Presence of aortocoronary bypass graft: Secondary | ICD-10-CM

## 2020-01-30 NOTE — Progress Notes (Signed)
Daily Session Note  Patient Details  Name: Brian Chapman MRN: [id number] Date of Birth: 29-Dec-1945 Referring Provider:     Cardiac Rehab from 10/18/2019 in [facility] Cardiac and Pulmonary Rehab  Referring Provider Arida      Encounter Date: 01/30/2020  Check In:  Session Check In - 01/30/20 0828      Check-In   Supervising physician immediately available to respond to emergencies See telemetry face sheet for immediately available ER MD    Location [facility]-Cardiac & Pulmonary Rehab    Staff Present Heath Lark, RN, BSN, CCRP;Joseph Foy [place], [place], ACSM CEP, Exercise Physiologist    Virtual Visit No    Medication changes reported     No    Fall or balance concerns reported    No    Warm-up and Cool-down Performed on first and last piece of equipment    Resistance Training Performed Yes    VAD Patient? No    PAD/SET Patient? No      Pain Assessment   Currently in Pain? No/denies              Social History   Tobacco Use  Smoking Status Former Smoker  . Packs/day: 0.25  . Years: 5.00  . Pack years: 1.25  . Types: Cigarettes  . Quit date: 69  . Years since quitting: 26.4  Smokeless Tobacco Never Used    Goals Met:  Independence with exercise equipment Exercise tolerated well No report of cardiac concerns or symptoms  Goals Unmet:  Not Applicable  Comments: Pt able to follow exercise prescription today without complaint.  Will continue to monitor for progression.    Dr. Emily Filbert is Medical Director for [place] and [name].

## 2020-02-01 ENCOUNTER — Encounter: Payer: No Typology Code available for payment source | Admitting: *Deleted

## 2020-02-01 ENCOUNTER — Other Ambulatory Visit: Payer: Self-pay

## 2020-02-01 DIAGNOSIS — Z951 Presence of aortocoronary bypass graft: Secondary | ICD-10-CM | POA: Diagnosis not present

## 2020-02-01 NOTE — Progress Notes (Signed)
Daily Session Note  Patient Details  Name: Brian Chapman MRN: [id number] Date of Birth: [birth year] Referring Provider:     Cardiac Rehab from 10/18/2019 in [facility] Cardiac and Pulmonary Rehab  Referring Provider [name]      Encounter Date: 02/01/2020  Check In:  Session Check In - 02/01/20 0737      Check-In   Supervising physician immediately available to respond to emergencies See telemetry face sheet for immediately available ER MD    Location [facility]-Cardiac & Pulmonary Rehab    Staff Present Heath Lark, RN, BSN, CCRP;Diantha Paxson [place], MA, RCEP, CCRP, [place], [place], ACSM CEP, Exercise Physiologist    Virtual Visit No    Medication changes reported     No    Fall or balance concerns reported    No    Warm-up and Cool-down Performed on first and last piece of equipment    Resistance Training Performed Yes    VAD Patient? No    PAD/SET Patient? No      Pain Assessment   Currently in Pain? No/denies              Social History   Tobacco Use  Smoking Status Former Smoker  . Packs/day: 0.25  . Years: 5.00  . Pack years: 1.25  . Types: Cigarettes  . Quit date: 55  . Years since quitting: 26.4  Smokeless Tobacco Never Used    Goals Met:  Independence with exercise equipment Exercise tolerated well Personal goals reviewed No report of cardiac concerns or symptoms Strength training completed today  Goals Unmet:  Not Applicable  Comments: Pt able to follow exercise prescription today without complaint.  Will continue to monitor for progression.    Dr. Emily Filbert is Medical Director for [place] and [name].

## 2020-02-13 ENCOUNTER — Encounter: Payer: No Typology Code available for payment source | Attending: Cardiovascular Disease | Admitting: *Deleted

## 2020-02-13 ENCOUNTER — Other Ambulatory Visit: Payer: Self-pay

## 2020-02-13 DIAGNOSIS — Z951 Presence of aortocoronary bypass graft: Secondary | ICD-10-CM | POA: Insufficient documentation

## 2020-02-13 NOTE — Progress Notes (Signed)
Daily Session Note  Patient Details  Name: AMAAN MEYER MRN: [id number] Date of Birth: 11-Mar-1946 Referring Provider:     Cardiac Rehab from 10/18/2019 in [facility] Cardiac and Pulmonary Rehab  Referring Provider [name]      Encounter Date: 02/13/2020  Check In:  Session Check In - 02/13/20 0820      Check-In   Supervising physician immediately available to respond to emergencies See telemetry face sheet for immediately available ER MD    Location [facility]-Cardiac & Pulmonary Rehab    Staff Present Heath Lark, RN, BSN, Jacklynn Bue, MS Exercise Physiologist;Amanda Oletta [place], [place], ACSM CEP, Exercise Physiologist    Virtual Visit No    Medication changes reported     No    Fall or balance concerns reported    No    Warm-up and Cool-down Performed on first and last piece of equipment    Resistance Training Performed Yes    VAD Patient? No    PAD/SET Patient? No      Pain Assessment   Currently in Pain? No/denies              Social History   Tobacco Use  Smoking Status Former Smoker  . Packs/day: 0.25  . Years: 5.00  . Pack years: 1.25  . Types: Cigarettes  . Quit date: 11  . Years since quitting: 26.5  Smokeless Tobacco Never Used    Goals Met:  Independence with exercise equipment Exercise tolerated well No report of cardiac concerns or symptoms  Goals Unmet:  Not Applicable  Comments: Pt able to follow exercise prescription today without complaint.  Will continue to monitor for progression.    Dr. Emily Filbert is Medical Director for [place] and [name].

## 2020-02-15 ENCOUNTER — Other Ambulatory Visit: Payer: Self-pay

## 2020-02-15 ENCOUNTER — Encounter: Payer: No Typology Code available for payment source | Admitting: *Deleted

## 2020-02-15 VITALS — Ht 66.0 in | Wt 211.4 lb

## 2020-02-15 DIAGNOSIS — Z951 Presence of aortocoronary bypass graft: Secondary | ICD-10-CM | POA: Diagnosis not present

## 2020-02-15 NOTE — Progress Notes (Signed)
Daily Session Note  Patient Details  Name: Brian Chapman MRN: [id number] Date of Birth: [birth year] Referring Provider:     Cardiac Rehab from 10/18/2019 in [facility] Cardiac and Pulmonary Rehab  Referring Provider [name]      Encounter Date: 02/15/2020  Check In:  Session Check In - 02/15/20 0736      Check-In   Supervising physician immediately available to respond to emergencies See telemetry face sheet for immediately available ER MD    Staff Present Heath Lark, RN, BSN, CCRP;Doria Fern [place], MA, RCEP, CCRP, CCET;Joseph [place], [place], ACSM CEP, Exercise Physiologist    Virtual Visit No    Medication changes reported     No    Fall or balance concerns reported    No    Warm-up and Cool-down Performed on first and last piece of equipment    Resistance Training Performed Yes    VAD Patient? No    PAD/SET Patient? No      Pain Assessment   Currently in Pain? No/denies              Social History   Tobacco Use  Smoking Status Former Smoker  . Packs/day: 0.25  . Years: 5.00  . Pack years: 1.25  . Types: Cigarettes  . Quit date: 36  . Years since quitting: 26.5  Smokeless Tobacco Never Used    Goals Met:  Independence with exercise equipment Exercise tolerated well No report of cardiac concerns or symptoms Strength training completed today  Goals Unmet:  Not Applicable  Comments: Pt able to follow exercise prescription today without complaint.  Will continue to monitor for progression.   [place] Name 10/18/19 1308 02/15/20 0734       6 Minute Walk   Phase Initial Discharge    Distance 1172 feet 1310 feet    Distance % Change -- 11.7 %    Distance Feet Change -- 138 ft    Walk Time 6 minutes 6 minutes    # of Rest Breaks 0 0    MPH 2.22 2.48    METS 2.22 2.37    RPE 7 11    Perceived Dyspnea  0 --    VO2 Peak 7.8 8.29    Symptoms No No    Resting HR 60 bpm 73 bpm    Resting BP 112/60 124/60    Resting Oxygen  Saturation  100 % --    Exercise Oxygen Saturation  during 6 min walk 98 % --    Max Ex. HR 91 bpm 88 bpm    Max Ex. BP 146/64 150/72    2 Minute Post BP 120/66 --             Dr. Emily Filbert is Medical Director for [place] and [name].

## 2020-02-20 ENCOUNTER — Encounter: Payer: No Typology Code available for payment source | Admitting: *Deleted

## 2020-02-20 ENCOUNTER — Other Ambulatory Visit: Payer: Self-pay

## 2020-02-20 DIAGNOSIS — Z951 Presence of aortocoronary bypass graft: Secondary | ICD-10-CM

## 2020-02-20 NOTE — Progress Notes (Signed)
Daily Session Note  Patient Details  Name: Brian Chapman MRN: [id number] Date of Birth: [birth year] Referring Provider:     Cardiac Rehab from 10/18/2019 in [facility]  Referring Provider [name]      Encounter Date: 02/20/2020  Check In:  Session Check In - 02/20/20 0911      Check-In   Supervising physician immediately available to respond to emergencies See telemetry face sheet for immediately available ER MD    Location [facility]-Cardiac [facility] Pulmonary Rehab    Staff Present Heath Lark, RN, BSN, Jacklynn Bue, MS Exercise Physiologist;Amanda Oletta Darter, [place], ACSM CEP, Exercise Physiologist    Virtual Visit No    Medication changes reported     No    Fall or balance concerns reported    No    Warm-up and Cool-down Performed on first and last piece of equipment    Resistance Training Performed Yes    VAD Patient? No    PAD/SET Patient? No      Pain Assessment   Currently in Pain? No/denies              Social History   Tobacco Use  Smoking Status Former Smoker  . Packs/day: 0.25  . Years: 5.00  . Pack years: 1.25  . Types: Cigarettes  . Quit date: 63  . Years since quitting: 26.5  Smokeless Tobacco Never Used    Goals Met:  Independence with exercise equipment Exercise tolerated well No report of cardiac concerns or symptoms  Goals Unmet:  Not Applicable  Comments: Pt able to follow exercise prescription today without complaint.  Will continue to monitor for progression.    Dr. Emily Filbert is Medical Director for [name].

## 2020-02-21 ENCOUNTER — Encounter: Payer: Self-pay | Admitting: *Deleted

## 2020-02-21 DIAGNOSIS — Z951 Presence of aortocoronary bypass graft: Secondary | ICD-10-CM

## 2020-02-21 NOTE — Progress Notes (Signed)
Cardiac Individual Treatment Plan  Patient Details  Name: Brian Chapman MRN: [id number] Date of Birth: Jan 27, 1946 Referring Provider:     Cardiac Rehab from 10/18/2019 in [facility] Cardiac and Pulmonary Rehab  Referring Provider Arida      Initial Encounter Date:    Cardiac Rehab from 10/18/2019 in [facility] Cardiac and Pulmonary Rehab  Date 10/18/19      Visit Diagnosis: S/P CABG x 4  Patient's Home Medications on Admission:  Current Outpatient Medications:  .  allopurinol (ZYLOPRIM) 300 MG tablet, Take 300 mg by mouth daily., Disp: , Rfl:  .  aspirin EC 81 MG tablet, Take 81 mg by mouth daily., Disp: , Rfl:  .  atorvastatin (LIPITOR) 80 MG tablet, Take 80 mg by mouth daily., Disp: , Rfl:  .  betamethasone valerate lotion (VALISONE) 0.1 %, Apply 1 application topically daily as needed for irritation., Disp: , Rfl:  .  chlorthalidone (HYGROTON) 25 MG tablet, Take 0.5 tablets (12.5 mg total) by mouth daily., Disp: 15 tablet, Rfl: 5 .  clopidogrel (PLAVIX) 75 MG tablet, Take 1 tablet (75 mg total) by mouth daily., Disp: 30 tablet, Rfl: 2 .  clopidogrel (PLAVIX) 75 MG tablet, Take 1 tablet (75 mg total) by mouth daily., Disp: 90 tablet, Rfl: 3 .  colchicine 0.6 MG tablet, Take 1 tablet (0.6 mg total) by mouth daily., Disp: 30 tablet, Rfl: 0 .  cyclobenzaprine (FLEXERIL) 5 MG tablet, Take 1 tablet (5 mg total) by mouth 3 (three) times daily as needed for muscle spasms., Disp: 15 tablet, Rfl: 0 .  desonide (DESOWEN) 0.05 % cream, Apply 1 application topically 2 (two) times daily., Disp: , Rfl:  .  diclofenac Sodium (VOLTAREN) 1 % GEL, Apply 4 g topically 4 (four) times daily., Disp: , Rfl:  .  glucose blood test strip, Test blood ac and hs...the patient has been instructed., Disp: 100 each, Rfl: 12 .  indomethacin (INDOCIN) 50 MG capsule, Take 50 mg by mouth daily as needed. Gout pain, Disp: , Rfl:  .  ketoconazole (NIZORAL) 2 % shampoo, Apply 1 application topically 2 (two) times a week.,  Disp: , Rfl:  .  Lancets (ONETOUCH ULTRASOFT) lancets, Use as instructed...test ac and hs., Disp: 100 each, Rfl: 12 .  metFORMIN (GLUCOPHAGE) 500 MG tablet, Take 500 mg by mouth daily with breakfast., Disp: , Rfl:  .  metoprolol tartrate (LOPRESSOR) 25 MG tablet, Take 12.5 mg by mouth 2 (two) times daily., Disp: , Rfl:  .  nitroGLYCERIN (NITROSTAT) 0.4 MG SL tablet, Place 1 tablet (0.4 mg total) under the tongue every 5 (five) minutes as needed for chest pain., Disp: 25 tablet, Rfl: 3  Past Medical History: Past Medical History:  Diagnosis Date  . CAD (coronary artery disease)    severe distal left main stenosis with plaque rupture, extending into the ostial LAD and occluded distal LAD, likely from embolization.  He had mild to moderate pRCA stenosis. He underwent POBA of distal LAD  . Diabetes mellitus without complication ([place])   . HFrEF (heart failure with reduced ejection fraction) (HCC)    2/6 TTE 45-50%, severe LVH, G1DD  . History of tobacco use   . Hyperlipidemia   . Hypertension   . OSA on CPAP    wears CPAP qHS per pt  . PAC (premature atrial contraction)    PAC verus post cabg AF on amiodarone s/p cath. Not on [place].  . S/P CABG x 4    2/7 LIMA-LAD, SVG-diagonal, SVG-OM1  to OM2    Tobacco Use: Social History   Tobacco Use  Smoking Status Former Smoker  . Packs/day: 0.25  . Years: 5.00  . Pack years: 1.25  . Types: Cigarettes  . Quit date: 37  . Years since quitting: 26.5  Smokeless Tobacco Never Used    Labs: Recent Chemical engineer    Labs for ITP Cardiac and Pulmonary Rehab Latest Ref Rng & Units 09/18/2019 09/18/2019 09/20/2019 09/21/2019 11/29/2019   Cholestrol 100 - 199 mg/dL - - - - 94(L)   LDLCALC 0 - 99 mg/dL - - - - 40   HDL >39 mg/dL - - - - 34(L)   Trlycerides 0 - 149 mg/dL - - - - 109   Hemoglobin A1c 4.8 - 5.6 % - - 6.5(H) 6.6(H) -   PHART 7.35 - 7.45 7.320(L) 7.302(L) - - -   PCO2ART 32 - 48 mmHg 42.0 44.7 - - -   HCO3 20.0 - 28.0 mmol/L 21.3  21.7 - - -   TCO2 22 - 32 mmol/L 22 23 - - -   ACIDBASEDEF 0.0 - 2.0 mmol/L 4.0(H) 4.0(H) - - -   O2SAT % 95.0 92.0 - - -       Exercise Target Goals: Exercise Program Goal: Individual exercise prescription set using results from initial 6 min walk test and THRR while considering  patient's activity barriers and safety.   Exercise Prescription Goal: Initial exercise prescription builds to 30-45 minutes a day of aerobic activity, 2-3 days per week.  Home exercise guidelines will be given to patient during program as part of exercise prescription that the participant will acknowledge.   Education: Aerobic Exercise & Resistance Training: - Gives group verbal and written instruction on the various components of exercise. Focuses on aerobic and resistive training programs and the benefits of this training and how to safely progress through these programs..   Education: Exercise & Equipment Safety: - Individual verbal instruction and demonstration of equipment use and safety with use of the equipment.   Cardiac Rehab from 10/18/2019 in [facility] Cardiac and Pulmonary Rehab  Date 10/18/19  Educator AS  Instruction Review Code 1- Verbalizes Understanding      Education: Exercise Physiology & General Exercise Guidelines: - Group verbal and written instruction with models to review the exercise physiology of the cardiovascular system and associated critical values. Provides general exercise guidelines with specific guidelines to those with heart or lung disease.    Education: Flexibility, Balance, Mind/Body Relaxation: Provides group verbal/written instruction on the benefits of flexibility and balance training, including mind/body exercise modes such as yoga, pilates and tai chi.  Demonstration and skill practice provided.   Activity Barriers & Risk Stratification:   6 Minute Walk:  6 Minute Walk    Row Name 10/18/19 1308 02/15/20 0734       6 Minute Walk   Phase Initial Discharge     Distance 1172 feet 1310 feet    Distance % Change -- 11.7 %    Distance Feet Change -- 138 ft    Walk Time 6 minutes 6 minutes    # of Rest Breaks 0 0    MPH 2.22 2.48    METS 2.22 2.37    RPE 7 11    Perceived Dyspnea  0 --    VO2 Peak 7.8 8.29    Symptoms No No    Resting HR 60 bpm 73 bpm    Resting BP 112/60 124/60    Resting  Oxygen Saturation  100 % --    Exercise Oxygen Saturation  during 6 min walk 98 % --    Max Ex. HR 91 bpm 88 bpm    Max Ex. BP 146/64 150/72    2 Minute Post BP 120/66 --           Oxygen Initial Assessment:   Oxygen Re-Evaluation:   Oxygen Discharge (Final Oxygen Re-Evaluation):   Initial Exercise Prescription:  Initial Exercise Prescription - 10/18/19 1300      Date of Initial Exercise RX and Referring Provider   Date 10/18/19    Referring Provider Arida      Treadmill   MPH 2    Grade 0    Minutes 15    METs 2.5      Recumbant Bike   Level 2    RPM 60    Minutes 15    METs 2.2      Arm Ergometer   Level 1    RPM 30    Minutes 15    METs 2.2      REL-XR   Level 2    Speed 50    Minutes 15    METs 2.2      Prescription Details   Frequency (times per week) 3    Duration Progress to 30 minutes of continuous aerobic without signs/symptoms of physical distress      Intensity   THRR 40-80% of Max Heartrate 95-130    Ratings of Perceived Exertion 11-13    Perceived Dyspnea 0-4      Resistance Training   Training Prescription Yes    Weight 3 lb    Reps 10-15           Perform Capillary Blood Glucose checks as needed.  Exercise Prescription Changes:  Exercise Prescription Changes    Row Name 10/18/19 1300 10/24/19 1100 11/06/19 1300 11/16/19 1300 11/21/19 1300     Response to Exercise   Blood Pressure (Admit) 112/60 124/60 122/70 -- 130/68   Blood Pressure (Exercise) 146/64 138/70 144/74 -- 122/62   Blood Pressure (Exit) 120/66 125/58 124/70 -- 106/58   Heart Rate (Admit) 60 bpm 78 bpm 66 bpm -- 40 bpm    Heart Rate (Exercise) 91 bpm 93 bpm 100 bpm -- 117 bpm   Heart Rate (Exit) 67 bpm 71 bpm 65 bpm -- 57 bpm   Oxygen Saturation (Admit) 100 % -- -- -- --   Oxygen Saturation (Exercise) 98 % -- -- -- --   Rating of Perceived Exertion (Exercise) '7 13 12 '$ -- 12   Perceived Dyspnea (Exercise) 0 -- -- -- --   Symptoms none none none -- none   Duration -- Continue with 30 min of aerobic exercise without signs/symptoms of physical distress. Continue with 30 min of aerobic exercise without signs/symptoms of physical distress. -- Continue with 30 min of aerobic exercise without signs/symptoms of physical distress.   Intensity -- THRR unchanged THRR unchanged -- THRR unchanged     Progression   Progression -- -- Continue to progress workloads to maintain intensity without signs/symptoms of physical distress. -- Continue to progress workloads to maintain intensity without signs/symptoms of physical distress.   Average METs -- -- 2.49 -- 2.6     Resistance Training   Training Prescription -- Yes Yes -- Yes   Weight -- 3 lb 3 lb -- 4 lb   Reps -- 10-15 10-15 -- 10-15     Interval Training   Interval  Training -- No No -- No     Treadmill   MPH -- 2 2 -- 1.8   Grade -- 0 0.5 -- 0   Minutes -- 15 15 -- 15   METs -- 2.5 2.67 -- 2.38     Recumbant Bike   Level -- -- 2 -- --   Minutes -- -- 15 -- --     Arm Ergometer   Level -- -- 1 -- --   Minutes -- -- 15 -- --   METs -- -- 2.3 -- --     Elliptical   Level -- 1 -- -- --   Minutes -- 15 -- -- --     REL-XR   Level -- -- -- -- 2   Speed -- -- -- -- 50   Minutes -- -- -- -- 15   METs -- -- -- -- 2.8     Home Exercise Plan   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 Add 2 additional days to program exercise sessions.   Initial Home Exercises Provided -- -- -- 11/16/19 11/16/19   Row Name 12/04/19 1500 12/12/19  1600 12/25/19 1600 01/11/20 0900 01/25/20 1400     Response to Exercise   Blood Pressure (Admit) 134/66 120/72 142/78 136/70 118/64   Blood Pressure (Exercise) 124/64 128/80 130/74 132/66 130/78   Blood Pressure (Exit) 126/64 112/64 122/62 114/66 118/64   Heart Rate (Admit) 61 bpm 66 bpm 63 bpm 81 bpm 66 bpm   Heart Rate (Exercise) 93 bpm 71 bpm 96 bpm 97 bpm 95 bpm   Heart Rate (Exit) 66 bpm 68 bpm 62 bpm 65 bpm 66 bpm   Rating of Perceived Exertion (Exercise) '12 13 13 13 12   '$ Symptoms none none none none none   Duration Continue with 30 min of aerobic exercise without signs/symptoms of physical distress. Continue with 30 min of aerobic exercise without signs/symptoms of physical distress. Continue with 30 min of aerobic exercise without signs/symptoms of physical distress. Continue with 30 min of aerobic exercise without signs/symptoms of physical distress. Continue with 30 min of aerobic exercise without signs/symptoms of physical distress.   Intensity THRR unchanged THRR unchanged THRR unchanged THRR unchanged THRR unchanged     Progression   Progression Continue to progress workloads to maintain intensity without signs/symptoms of physical distress. Continue to progress workloads to maintain intensity without signs/symptoms of physical distress. Continue to progress workloads to maintain intensity without signs/symptoms of physical distress. Continue to progress workloads to maintain intensity without signs/symptoms of physical distress. Continue to progress workloads to maintain intensity without signs/symptoms of physical distress.   Average METs 2.29 2.5 2.68 2.4 2.64     Resistance Training   Training Prescription Yes Yes Yes Yes Yes   Weight 4 lb 4 lb 4 lb 4 lb 4 lb   Reps 10-15 10-15 10-15 10-15 10-15     Interval Training   Interval Training No -- No -- No     Treadmill   MPH 2 -- 2.'2 2 2   '$ Grade 0.5 -- 0.5 0.5 1   Minutes 15 -- '15 15 15   '$ METs 2.67 -- 2.84 2.67 2.81      Recumbant Bike   Level '2 2 2 '$ -- 2   RPM -- 60 -- -- --   Minutes '15 15 15 '$ -- 15  METs -- 2.85 3.2 -- 2.8     NuStep   Level -- -- 1 -- 5   Minutes -- -- 15 -- 15   METs -- -- 2.8 -- 2.3     Arm Ergometer   Level 2 2 -- -- --   RPM -- 30 -- -- --   Minutes 15 15 -- -- --   METs 2.1 2.2 -- -- --     REL-XR   Level 1 -- 1 -- --   Minutes 15 -- 15 -- --   METs 2.1 -- -- -- --     T5 Nustep   Level -- -- '1 3 3   '$ SPM -- -- -- 80 --   Minutes -- -- '15 15 15   '$ METs -- -- 1.9 1.9 --     Home Exercise Plan   Plans to continue exercise at [employer] (comment) -- 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 -- Add 2 additional days to program exercise sessions. Add 2 additional days to program exercise sessions. Add 2 additional days to program exercise sessions.   Initial Home Exercises Provided 11/16/19 -- 11/16/19 11/16/19 11/16/19   Row Name 02/06/20 1500 02/19/20 1400           Response to Exercise   Blood Pressure (Admit) 108/64 124/64      Blood Pressure (Exercise) 126/74 150/72      Blood Pressure (Exit) 120/66 122/66      Heart Rate (Admit) 66 bpm 73 bpm      Heart Rate (Exercise) 105 bpm 88 bpm      Heart Rate (Exit) 64 bpm 66 bpm      Rating of Perceived Exertion (Exercise) 13 12      Symptoms none none      Duration Continue with 30 min of aerobic exercise without signs/symptoms of physical distress. Continue with 30 min of aerobic exercise without signs/symptoms of physical distress.      Intensity THRR unchanged THRR unchanged        Progression   Progression Continue to progress workloads to maintain intensity without signs/symptoms of physical distress. Continue to progress workloads to maintain intensity without signs/symptoms of physical distress.      Average METs 2.7 2.43        Resistance Training   Training Prescription Yes Yes      Weight  4 lb 4 lb      Reps 10-15 10-15        Interval Training   Interval Training No No        Treadmill   MPH 2 2      Grade 1 1      Minutes 15 15      METs 2.81 2.81        Recumbant Bike   Level 2 2      [place] -- 26      Minutes 15 15      METs 3.5 3.12        NuStep   Level 5 5      Minutes 15 15      METs 2.8 2.1        T5 Nustep   Level 3 3      Minutes 15 15      METs 2 2        Home Exercise Plan   Plans to continue  exercise at [employer] (comment) [employer] (comment)      Frequency Add 2 additional days to program exercise sessions. Add 2 additional days to program exercise sessions.      Initial Home Exercises Provided 11/16/19 11/16/19             Exercise Comments:  Exercise Comments    Row Name 10/24/19 0831 11/21/19 0802         Exercise Comments First full day of exercise!  Patient was oriented to gym and equipment including functions, settings, policies, and procedures.  Patient's individual exercise prescription and treatment plan were reviewed.  All starting workloads were established based on the results of the 6 minute walk test done at initial orientation visit.  The plan for exercise progression was also introduced and progression will be customized based on patient's performance and goals. Brian Chapman arrived to tell staff he was feeling very fatigued. He received his COVID Vaccine yesterday.  Sent him home to rest, no session today             Exercise Goals and Review:  Exercise Goals    Row Name 10/18/19 1317             Exercise Goals   Increase Physical Activity Yes       Intervention Provide advice, education, support and counseling about physical activity/exercise needs.;Develop an individualized exercise prescription for aerobic and resistive training based on initial evaluation findings, risk stratification, comorbidities and participant's personal goals.       Expected Outcomes Short Term: Attend rehab on a regular  basis to increase amount of physical activity.;Long Term: Add in home exercise to make exercise part of routine and to increase amount of physical activity.;Long Term: Exercising regularly at least 3-5 days a week.       Increase Strength and Stamina Yes       Intervention Provide advice, education, support and counseling about physical activity/exercise needs.;Develop an individualized exercise prescription for aerobic and resistive training based on initial evaluation findings, risk stratification, comorbidities and participant's personal goals.       Expected Outcomes Short Term: Increase workloads from initial exercise prescription for resistance, speed, and METs.;Short Term: Perform resistance training exercises routinely during rehab and add in resistance training at home;Long Term: Improve cardiorespiratory fitness, muscular endurance and strength as measured by increased METs and functional capacity ( )       Able to understand and use rate of perceived exertion (RPE) scale Yes       Intervention Provide education and explanation on how to use RPE scale       Expected Outcomes Short Term: Able to use RPE daily in rehab to express subjective intensity level;Long Term:  Able to use RPE to guide intensity level when exercising independently       Able to understand and use Dyspnea scale Yes       Intervention Provide education and explanation on how to use Dyspnea scale       Expected Outcomes Short Term: Able to use Dyspnea scale daily in rehab to express subjective sense of shortness of breath during exertion;Long Term: Able to use Dyspnea scale to guide intensity level when exercising independently       Knowledge and understanding of Target Heart Rate Range (THRR) Yes       Intervention Provide education and explanation of THRR including how the numbers were predicted and where they are located for reference       Expected Outcomes  Short Term: Able to state/look up THRR;Short Term: Able to  use daily as guideline for intensity in rehab;Long Term: Able to use THRR to govern intensity when exercising independently       Able to check pulse independently Yes       Intervention Provide education and demonstration on how to check pulse in carotid and radial arteries.;Review the importance of being able to check your own pulse for safety during independent exercise       Expected Outcomes Short Term: Able to explain why pulse checking is important during independent exercise;Long Term: Able to check pulse independently and accurately       Understanding of Exercise Prescription Yes       Intervention Provide education, explanation, and written materials on patient's individual exercise prescription       Expected Outcomes Short Term: Able to explain program exercise prescription;Long Term: Able to explain home exercise prescription to exercise independently              Exercise Goals Re-Evaluation :  Exercise Goals Re-Evaluation    Row Name 10/24/19 0831 11/06/19 1343 11/16/19 1309 11/21/19 1342 12/04/19 1544     Exercise Goal Re-Evaluation   Exercise Goals Review Able to understand and use rate of perceived exertion (RPE) scale;Knowledge and understanding of Target Heart Rate Range (THRR);Understanding of Exercise Prescription Increase Physical Activity;Increase Strength and Stamina;Understanding of Exercise Prescription Increase Physical Activity;Increase Strength and Stamina;Able to understand and use rate of perceived exertion (RPE) scale;Able to understand and use Dyspnea scale;Knowledge and understanding of Target Heart Rate Range (THRR);Able to check pulse independently;Understanding of Exercise Prescription Increase Physical Activity;Increase Strength and Stamina;Able to understand and use rate of perceived exertion (RPE) scale;Able to understand and use Dyspnea scale;Knowledge and understanding of Target Heart Rate Range (THRR);Able to check pulse independently;Understanding of  Exercise Prescription Increase Physical Activity;Increase Strength and Stamina;Understanding of Exercise Prescription   Comments Reviewed RPE scale, THR and program prescription with pt today.  Pt voiced understanding and was given a copy of goals to take home. Brian Chapman is off to a good start in rehab.  He has completed his first four full days of exercise.  We will continue to monitor his progress. eviewed home exercise with pt today.  Pt plans to go to [employer] for exercise.  Reviewed THR, pulse, RPE, sign and symptoms, NTG use, and when to call 911 or MD.  Also discussed weather considerations and indoor options.  Pt voiced understanding. Brian Chapman has moved up to 4 lb for strength work.  He has a Higher education careers adviser at [employer] and plans to exercise there on weekends. Brian Chapman is doing well in rehab.  He is now on level 2 for the arm crank. We will continue to monitor his progress.   Expected Outcomes Short: Use RPE daily to regulate intensity. Long: Follow program prescription in THR. Short: Continue to attend regularly  Long: Continue to follow program prescription. Short : exercsie 1-2 days outside HT LOng: maintain exercise on his own Short: add in exercise on weekends Long: increase stamina Short: Increase workload on XR  Long: Continue to improve stamina   Row Name 12/12/19 0815 12/25/19 1621 01/09/20 0748 01/25/20 1435 01/30/20 0813     Exercise Goal Re-Evaluation   Exercise Goals Review Increase Physical Activity;Increase Strength and Stamina;Able to understand and use rate of perceived exertion (RPE) scale;Able to understand and use Dyspnea scale;Knowledge and understanding of Target Heart Rate Range (THRR);Able to check pulse independently;Understanding of Exercise Prescription Increase  Physical Activity;Increase Strength and Stamina;Understanding of Exercise Prescription Increase Physical Activity;Increase Strength and Stamina;Understanding of Exercise Prescription Increase Physical Activity;Increase  Strength and Stamina;Understanding of Exercise Prescription Increase Physical Activity;Increase Strength and Stamina;Understanding of Exercise Prescription   Comments We discussed adding on emore day of exercise on weekends. Brian Chapman is doing well in rehab.  He is out this week for vacation on a trout fishing trip.  He was looking forward to it and hopes to get in some walking as well.  He is up to 2.2 mph on the treadmill.  We will continue to monitor his progress. Brian Chapman works 8-10 hours a day and walks a lot at work.  He is tired by the end of the day.  He plans to go to [employer] before work when he finishes HT. Brian Chapman has been doing well in rehab.  He is now on level 5 of the NuStep and at 2.8 METs on the bike.  We will continue to monitor his progress. Brian Chapman plans to workout at [employer] once he completes HT.   Expected Outcomes Short:  add one day of exercise on weekends Long: exercise 3-5 days per week Short: Walk while on vacation  Long: Continue to improve stamina Short:  add exercise on weekends Long: maintian exercise on his own Short: Continue to add in more exercise at home Long: Continue to improve stamina. Short:  attend HT consistently Long:  increase MET level   Row Name 02/01/20 0737 02/06/20 1507 02/19/20 1436         Exercise Goal Re-Evaluation   Exercise Goals Review Increase Physical Activity;Increase Strength and Stamina;Understanding of Exercise Prescription Increase Physical Activity;Increase Strength and Stamina;Understanding of Exercise Prescription Increase Physical Activity;Increase Strength and Stamina;Understanding of Exercise Prescription     Comments Brian Chapman is doing well in rehab. He is feeling good with exercise.  He stays busy with work and church.  He is still planning to join [employer] as it is just down the street once he graduates. He is feeling good overall. Brian Chapman continues to yield good results from rehab. Continue to encourage exercise indepedently with  appropriate HR ranges. Will continue to monitor. Brian Chapman is nearing graduation.  He improved his post 6MWT by more than 11%!  He is planning to continue to exericse by walking and getting treadmill for home use.  We will continue to monitor progress to graduation.     Expected Outcomes Short: Continue to work toward graduation and add in walking at home.  Long; [employer]. Short: Increase days of walking outside of rehab. Long:  Increase strength/ stamina and continue to progress MET level Short: graduate  Long; Continue to exercise independently            Discharge Exercise Prescription (Final Exercise Prescription Changes):  Exercise Prescription Changes - 02/19/20 1400      Response to Exercise   Blood Pressure (Admit) 124/64    Blood Pressure (Exercise) 150/72    Blood Pressure (Exit) 122/66    Heart Rate (Admit) 73 bpm    Heart Rate (Exercise) 88 bpm    Heart Rate (Exit) 66 bpm    Rating of Perceived Exertion (Exercise) 12    Symptoms none    Duration Continue with 30 min of aerobic exercise without signs/symptoms of physical distress.    Intensity THRR unchanged      Progression   Progression Continue to progress workloads to maintain intensity without signs/symptoms of physical distress.    Average METs 2.43  Resistance Training   Training Prescription Yes    Weight 4 lb    Reps 10-15      Interval Training   Interval Training No      Treadmill   MPH 2    Grade 1    Minutes 15    METs 2.81      Recumbant Bike   Level 2    Watts 26    Minutes 15    METs 3.12      NuStep   Level 5    Minutes 15    METs 2.1      T5 Nustep   Level 3    Minutes 15    METs 2      Home Exercise Plan   Plans to continue exercise at [employer] (comment)    Frequency Add 2 additional days to program exercise sessions.    Initial Home Exercises Provided 11/16/19           Nutrition:  Target Goals: Understanding of nutrition guidelines, daily intake of  sodium '1500mg'$ , cholesterol '200mg'$ , calories 30% from fat and 7% or less from saturated fats, daily to have 5 or more servings of fruits and vegetables.  Education: Controlling Sodium/Reading Food Labels -Group verbal and written material supporting the discussion of sodium use in heart healthy nutrition. Review and explanation with models, verbal and written materials for utilization of the food label.   Education: General Nutrition Guidelines/Fats and Fiber: -Group instruction provided by verbal, written material, models and posters to present the general guidelines for heart healthy nutrition. Gives an explanation and review of dietary fats and fiber.   Biometrics:  Pre Biometrics - 10/18/19 1318      Pre Biometrics   Height '5\' 6"'$  (1.676 m)    Weight 201 lb 14.4 oz (91.6 kg)    BMI (Calculated) 32.6           Post Biometrics - 02/15/20 0735       Post  Biometrics   Height '5\' 6"'$  (1.676 m)    Weight 211 lb 6.4 oz (95.9 kg)    BMI (Calculated) 34.14    Single Leg Stand 3.32 seconds           Nutrition Therapy Plan and Nutrition Goals:  Nutrition Therapy & Goals - 11/20/19 0701      Nutrition Therapy   Diet HH, Low Na    Protein (specify units) 75-80g    Fiber 30 grams    Whole Grain Foods 3 servings    Saturated Fats 12 max. grams    Fruits and Vegetables 5 servings/day    Sodium 1.5 grams      Personal Nutrition Goals   Nutrition Goal ST: continue current diet LT: enhance longevity    Comments awake 330-430am B: coffee, toast or english muffin with butter, jelly, or peanut butter B2: coffee (sometimes) Lor S: sandwich ([place] on wrap) or some peanut butter crackers D: wife cooks or leftovers. Meat, vegetables. Pt reports weight stable for 30 years. Pt reports for snacks will also have trail mix and fruit. Pt reports having a variety of vegetables and using HH methods of cooking. Suggested whole grains for breads and wraps. Discussed HH eating. Pt reports eating  intuitively.      Intervention Plan   Intervention Prescribe, educate and counsel regarding individualized specific dietary modifications aiming towards targeted core components such as weight, hypertension, lipid management, diabetes, heart failure and other comorbidities.;Nutrition handout(s) given to patient.  Expected Outcomes Short Term Goal: Understand basic principles of dietary content, such as calories, fat, sodium, cholesterol and nutrients.;Short Term Goal: A plan has been developed with personal nutrition goals set during dietitian appointment.;Long Term Goal: Adherence to prescribed nutrition plan.           Nutrition Assessments:  Nutrition Assessments - 10/19/19 1124      MEDFICTS Scores   Pre Score 99           MEDIFICTS Score Key:          ?70 Need to make dietary changes          40-70 Heart Healthy Diet         ? 40 Therapeutic Level Cholesterol Diet  Nutrition Goals Re-Evaluation:  Nutrition Goals Re-Evaluation    Row Name 12/19/19 0815 01/30/20 5361           Goals   Nutrition Goal ST: continue current diet LT: enhance longevity Brian Chapman continues to eat a balanced diet and avoids junk food.      Comment Continue with current changes --      Expected Outcome ST: continue current diet LT: enhance longevity Short: continue heart healthy eating Long: maintain heart health             Nutrition Goals Discharge (Final Nutrition Goals Re-Evaluation):  Nutrition Goals Re-Evaluation - 01/30/20 0812      Goals   Nutrition Goal Brian Chapman continues to eat a balanced diet and avoids junk food.    Expected Outcome Short: continue heart healthy eating Long: maintain heart health           Psychosocial: Target Goals: Acknowledge presence or absence of significant depression and/or stress, maximize coping skills, provide positive support system. Participant is able to verbalize types and ability to use techniques and skills needed for reducing stress and  depression.   Education: Depression - Provides group verbal and written instruction on the correlation between heart/lung disease and depressed mood, treatment options, and the stigmas associated with seeking treatment.   Education: Sleep Hygiene -Provides group verbal and written instruction about how sleep can affect your health.  Define sleep hygiene, discuss sleep cycles and impact of sleep habits. Review good sleep hygiene tips.     Education: Stress and Anxiety: - Provides group verbal and written instruction about the health risks of elevated stress and causes of high stress.  Discuss the correlation between heart/lung disease and anxiety and treatment options. Review healthy ways to manage with stress and anxiety.    Initial Review & Psychosocial Screening:  Initial Psych Review & Screening - 10/17/19 1311      Initial Review   Current issues with Current Sleep Concerns      Family Dynamics   Good Support System? Yes    Comments His incision is still tender which is making it hard to sleep. He can look to his wife for support.      Barriers   Psychosocial barriers to participate in program There are no identifiable barriers or psychosocial needs.      Screening Interventions   Interventions Provide feedback about the scores to participant;Encouraged to exercise;Program counselor consult;To provide support and resources with identified psychosocial needs    Expected Outcomes Short Term goal: Utilizing psychosocial counselor, staff and physician to assist with identification of specific Stressors or current issues interfering with healing process. Setting desired goal for each stressor or current issue identified.;Long Term Goal: Stressors or current issues are controlled or eliminated.;Short  Term goal: Identification and review with participant of any Quality of Life or Depression concerns found by scoring the questionnaire.;Long Term goal: The participant improves quality of  Life and PHQ9 Scores as seen by post scores and/or verbalization of changes           Quality of Life Scores:   Quality of Life - 10/18/19 1320      Quality of Life   Select Quality of Life      Quality of Life Scores   Health/Function Pre 28.8 %    Socioeconomic Pre 29.14 %    Psych/Spiritual Post 30 %    Family Pre 30 %          Scores of 19 and below usually indicate a poorer quality of life in these areas.  A difference of  2-3 points is a clinically meaningful difference.  A difference of 2-3 points in the total score of the Quality of Life Index has been associated with significant improvement in overall quality of life, self-image, physical symptoms, and general health in studies assessing change in quality of life.  PHQ-9: Recent Review Flowsheet Data    Depression screen [facility] 2/9 11/28/2019 10/18/2019   Decreased Interest 0 1   Down, Depressed, Hopeless 0 1   PHQ - 2 Score 0 2   Altered sleeping 1 1   Tired, decreased energy 1 1   Change in appetite 0 1   Feeling bad or failure about yourself  0 0   Trouble concentrating 0 1   Moving slowly or fidgety/restless 0 1   Suicidal thoughts 0 0   PHQ-9 Score 2 7   Difficult doing work/chores Not difficult at all  Somewhat difficult     Interpretation of Total Score  Total Score Depression Severity:  1-4 = Minimal depression, 5-9 = Mild depression, 10-14 = Moderate depression, 15-19 = Moderately severe depression, 20-27 = Severe depression   Psychosocial Evaluation and Intervention:  Psychosocial Evaluation - 02/15/20 0737      Discharge Psychosocial Assessment & Intervention   Comments Brian Chapman will be graduating next week.  He has done well in rehab. He is planning to join [employer].  He feels a lot better and more steady on his feet now.  He is enjoying life and happy to be alive and functional.           Psychosocial Re-Evaluation:  Psychosocial Re-Evaluation    Row Name 11/23/19 [phone] 12/12/19 0812 01/09/20  0751 01/30/20 0811 02/01/20 0742     Psychosocial Re-Evaluation   Current issues with Current Sleep Concerns;Current Stress Concerns Current Sleep Concerns;Current Stress Concerns -- Current Sleep Concerns;Current Stress Concerns Current Sleep Concerns   Comments His incision is feeling better so he has been sleeping better.  He feels everything is moving in the right direction. Brian Chapman is sleeping well and feeels fine.  He got second Covid vaccine yesterday. No changes in stress or sleep No changes in stress or sleep - he sleeps 3-5 hours a night and generally feels rested. Brian Chapman is doing well and feeling good overall.  Sleep is still an issue.  Continue to work on sleep.   Expected Outcomes Short: continue good sleep patterns Long: maintain positive outlook Short:  continue good sleep patterns Long: maintain positive outlook Short: maintain good sleep patterns Long: stay positive Short: maintain good sleep patterns Long: stay positive Short: maintain good sleep patterns Long: stay positive   Interventions -- -- -- Encouraged to attend Cardiac Rehabilitation  for the exercise Encouraged to attend Cardiac Rehabilitation for the exercise          Psychosocial Discharge (Final Psychosocial Re-Evaluation):  Psychosocial Re-Evaluation - 02/01/20 0742      Psychosocial Re-Evaluation   Current issues with Current Sleep Concerns    Comments Brian Chapman is doing well and feeling good overall.  Sleep is still an issue.  Continue to work on sleep.    Expected Outcomes Short: maintain good sleep patterns Long: stay positive    Interventions Encouraged to attend Cardiac Rehabilitation for the exercise           Vocational Rehabilitation: Provide vocational rehab assistance to qualifying candidates.   Vocational Rehab Evaluation & Intervention:   Education: Education Goals: Education classes will be provided on a variety of topics geared toward better understanding of heart health and risk factor  modification. Participant will state understanding/return demonstration of topics presented as noted by education test scores.  Learning Barriers/Preferences:  Learning Barriers/Preferences - 10/17/19 1313      Learning Barriers/Preferences   Learning Barriers None    Learning Preferences None           General Cardiac Education Topics:  AED/CPR: - Group verbal and written instruction with the use of models to demonstrate the basic use of the AED with the basic ABC's of resuscitation.   Anatomy & Physiology of the Heart: - Group verbal and written instruction and models provide basic cardiac anatomy and physiology, with the coronary electrical and arterial systems. Review of Valvular disease and Heart Failure   Cardiac Procedures: - Group verbal and written instruction to review commonly prescribed medications for heart disease. Reviews the medication, class of the drug, and side effects. Includes the steps to properly store meds and maintain the prescription regimen. (beta blockers and nitrates)   Cardiac Medications I: - Group verbal and written instruction to review commonly prescribed medications for heart disease. Reviews the medication, class of the drug, and side effects. Includes the steps to properly store meds and maintain the prescription regimen.   Cardiac Medications II: -Group verbal and written instruction to review commonly prescribed medications for heart disease. Reviews the medication, class of the drug, and side effects. (all other drug classes)    Go Sex-Intimacy & Heart Disease, Get SMART - Goal Setting: - Group verbal and written instruction through game format to discuss heart disease and the return to sexual intimacy. Provides group verbal and written material to discuss and apply goal setting through the application of the S.M.A.R.T. Method.   Other Matters of the Heart: - Provides group verbal, written materials and models to describe Stable Angina  and Peripheral Artery. Includes description of the disease process and treatment options available to the cardiac patient.   Infection Prevention: - Provides verbal and written material to individual with discussion of infection control including proper hand washing and proper equipment cleaning during exercise session.   Cardiac Rehab from 10/18/2019 in [facility] Cardiac and Pulmonary Rehab  Date 10/18/19  Educator AS  Instruction Review Code 1- Verbalizes Understanding      Falls Prevention: - Provides verbal and written material to individual with discussion of falls prevention and safety.   Cardiac Rehab from 10/18/2019 in [facility] Cardiac and Pulmonary Rehab  Date 10/18/19  Educator AS  Instruction Review Code 1- Verbalizes Understanding      Other: -Provides group and verbal instruction on various topics (see comments)   Knowledge Questionnaire Score:  Knowledge Questionnaire Score - 10/18/19 1320  Knowledge Questionnaire Score   Pre Score 24/26           Core Components/Risk Factors/Patient Goals at Admission:  Personal Goals and Risk Factors at Admission - 10/18/19 1319      Core Components/Risk Factors/Patient Goals on Admission    Weight Management Yes;Weight Maintenance;Weight Loss    Admit Weight 201 lb 14.4 oz (91.6 kg)    Goal Weight: Short Term 190 lb (86.2 kg)    Goal Weight: Long Term 180 lb (81.6 kg)    Expected Outcomes Short Term: Continue to assess and modify interventions until short term weight is achieved;Long Term: Adherence to nutrition and physical activity/exercise program aimed toward attainment of established weight goal;Weight Loss: Understanding of general recommendations for a balanced deficit meal plan, which promotes 1-2 lb weight loss per week and includes a negative energy balance of [phone] kcal/d;Understanding recommendations for meals to include 15-35% energy as protein, 25-35% energy from fat, 35-60% energy from carbohydrates, less than  '200mg'$  of dietary cholesterol, 20-35 gm of total fiber daily;Understanding of distribution of calorie intake throughout the day with the consumption of 4-5 meals/snacks    Diabetes Yes    Intervention Provide education about signs/symptoms and action to take for hypo/hyperglycemia.;Provide education about proper nutrition, including hydration, and aerobic/resistive exercise prescription along with prescribed medications to achieve blood glucose in normal ranges: Fasting glucose 65-99 mg/dL    Expected Outcomes Short Term: Participant verbalizes understanding of the signs/symptoms and immediate care of hyper/hypoglycemia, proper foot care and importance of medication, aerobic/resistive exercise and nutrition plan for blood glucose control.;Long Term: Attainment of HbA1C < 7%.           Education:Diabetes - Individual verbal and written instruction to review signs/symptoms of diabetes, desired ranges of glucose level fasting, after meals and with exercise. Acknowledge that pre and post exercise glucose checks will be done for 3 sessions at entry of program.   Cardiac Rehab from 10/18/2019 in [facility] Cardiac and Pulmonary Rehab  Date 10/18/19  Educator AS  Instruction Review Code 1- Verbalizes Understanding      Education: Know Your Numbers and Risk Factors: -Group verbal and written instruction about important numbers in your health.  Discussion of what are risk factors and how they play a role in the disease process.  Review of Cholesterol, Blood Pressure, Diabetes, and BMI and the role they play in your overall health.   Core Components/Risk Factors/Patient Goals Review:   Goals and Risk Factor Review    Row Name 11/23/19 0748 12/12/19 0809 01/09/20 0745 01/30/20 0809 02/01/20 0743     Core Components/Risk Factors/Patient Goals Review   Personal Goals Review Weight Management/Obesity;Hypertension;Lipids;Diabetes Weight Management/Obesity;Hypertension;Lipids;Diabetes Weight  Management/Obesity;Hypertension;Lipids;Diabetes Weight Management/Obesity;Hypertension;Lipids;Diabetes Weight Management/Obesity;Hypertension;Lipids;Diabetes   Review Brian Chapman is taking all meds as directed.  He takes his BP a couple times per week.  He doesn't regularly check BG at home.  He feels more limbered up by coming to exercise.  He likes the routine of the program. Brian Chapman says his weight fluctuates on our scale.  He is taking meds as directed.  He walks a lot at work but hasnt exercised outside program sessions.  He checks BP every night and it has been fine. Brian Chapman continues to check BP at home.  He is taking all meds as directed.  He hasnt been able to exercise outside HT sessions.  We reviewed THR range/RPE. Brian Chapman is taking meds as directed and monitroing BP at home.  He plans to exercise at [employer] when  he completes HT. Brian Chapman feels better than he ever has and in the habit of checking things.  He said if he didn't have the scars he wouldn't know that anything had happened.   Expected Outcomes Short : check with Dr about how often to monitor BG Long:  manage risk factors Short: continue to monitor risk factos Long: keep BP/BG in acceptable range Short:  continue to monitor risk factors Long:  maintain heart health long term Short: continue to monitor risk factors Long: maintain heart health Short: continue to monitor risk factors Long: maintain heart health          Core Components/Risk Factors/Patient Goals at Discharge (Final Review):   Goals and Risk Factor Review - 02/01/20 0743      Core Components/Risk Factors/Patient Goals Review   Personal Goals Review Weight Management/Obesity;Hypertension;Lipids;Diabetes    Review Brian Chapman feels better than he ever has and in the habit of checking things.  He said if he didn't have the scars he wouldn't know that anything had happened.    Expected Outcomes Short: continue to monitor risk factors Long: maintain heart health           ITP  Comments:  ITP Comments    Row Name 10/17/19 1310 10/24/19 0831 11/01/19 0608 11/20/19 0717 11/21/19 0757   ITP Comments Virtual Orientation performed. Patient informed when to come in for RD and EP orientation. Diagnosis can be found in [facility] 09/17/2019 First full day of exercise!  Patient was oriented to gym and equipment including functions, settings, policies, and procedures.  Patient's individual exercise prescription and treatment plan were reviewed.  All starting workloads were established based on the results of the 6 minute walk test done at initial orientation visit.  The plan for exercise progression was also introduced and progression will be customized based on patient's performance and goals. 30 day chart review completed. ITP sent to Dr Zachery Dakins Medical Director, for review,changes as needed and signature. Continue with ITP if no changes requested Completed Initial RD Alberteen Spindle arrived to tell staff he was feeling very fatigued. He received his COVID Vaccine yesterday.  Sent him home to rest, no session today   [place] Name 11/29/19 0540 12/25/19 1621 12/27/19 0544 01/24/20 0523 02/21/20 0622   ITP Comments 30 Day review completed. Medical Director review done, changes made as directed,and approval shown by signature of Market researcher. Brian Chapman is out for week trout fishing in [place] [employer]. 30 Day review completed. ITP review done, changes made as directed,and approval shown by signature of  Scientist, research (life sciences). 30 Day review completed. Medical Director ITP review done, changes made as directed, and signed approval by Medical Director. 30 Day review completed. Medical Director ITP review done, changes made as directed, and signed approval by Medical Director.         Comments:

## 2020-02-22 ENCOUNTER — Other Ambulatory Visit: Payer: Self-pay

## 2020-02-22 ENCOUNTER — Encounter: Payer: No Typology Code available for payment source | Admitting: *Deleted

## 2020-02-22 DIAGNOSIS — Z951 Presence of aortocoronary bypass graft: Secondary | ICD-10-CM | POA: Diagnosis not present

## 2020-02-22 NOTE — Progress Notes (Signed)
Daily Session Note  Patient Details  Name: Brian Chapman MRN: [id number] Date of Birth: 02/27/1946 Referring Provider:     Cardiac Rehab from 10/18/2019 in [facility] Cardiac and Pulmonary Rehab  Referring Provider [name]      Encounter Date: 02/22/2020  Check In:  Session Check In - 02/22/20 0718      Check-In   Supervising physician immediately available to respond to emergencies See telemetry face sheet for immediately available ER MD    Location [facility]-Cardiac & Pulmonary Rehab    Staff Present Renita Papa, RN BSN;Jessica Luan Pulling, MA, RCEP, CCRP, CCET;Melissa McAllister RDN, LDN    Virtual Visit No    Medication changes reported     No    Fall or balance concerns reported    No    Warm-up and Cool-down Performed on first and last piece of equipment    Resistance Training Performed Yes    VAD Patient? No    PAD/SET Patient? No      Pain Assessment   Currently in Pain? No/denies              Social History   Tobacco Use  Smoking Status Former Smoker  . Packs/day: 0.25  . Years: 5.00  . Pack years: 1.25  . Types: Cigarettes  . Quit date: 73  . Years since quitting: 26.5  Smokeless Tobacco Never Used    Goals Met:  Independence with exercise equipment Exercise tolerated well No report of cardiac concerns or symptoms Strength training completed today  Goals Unmet:  Not Applicable  Comments: Pt able to follow exercise prescription today without complaint.  Will continue to monitor for progression.    Dr. Emily Filbert is Medical Director for [name].

## 2020-02-24 DIAGNOSIS — G4733 Obstructive sleep apnea (adult) (pediatric): Secondary | ICD-10-CM | POA: Diagnosis not present

## 2020-02-26 DIAGNOSIS — I219 Acute myocardial infarction, unspecified: Secondary | ICD-10-CM | POA: Diagnosis present

## 2020-02-27 ENCOUNTER — Other Ambulatory Visit: Payer: Self-pay

## 2020-02-27 ENCOUNTER — Encounter: Payer: No Typology Code available for payment source | Admitting: *Deleted

## 2020-02-27 DIAGNOSIS — I2510 Atherosclerotic heart disease of native coronary artery without angina pectoris: Secondary | ICD-10-CM | POA: Diagnosis not present

## 2020-02-27 DIAGNOSIS — E119 Type 2 diabetes mellitus without complications: Secondary | ICD-10-CM | POA: Diagnosis not present

## 2020-02-27 DIAGNOSIS — Z951 Presence of aortocoronary bypass graft: Secondary | ICD-10-CM

## 2020-02-27 DIAGNOSIS — Z955 Presence of coronary angioplasty implant and graft: Secondary | ICD-10-CM | POA: Diagnosis not present

## 2020-02-27 DIAGNOSIS — I10 Essential (primary) hypertension: Secondary | ICD-10-CM | POA: Diagnosis not present

## 2020-02-27 NOTE — Progress Notes (Signed)
Discharge Progress Report  Patient Details  Name: TEEGAN Chapman MRN: [id number] Date of Birth: 04-Jun-1946 Referring Provider:     Cardiac Rehab from 10/18/2019 in [facility] [place] [facility]  Referring Provider [place]       Number of Visits: 87  Reason for Discharge:  Patient reached a stable level of exercise. Patient independent in their exercise. Patient has met program and personal goals.  Smoking History:  Social History   Tobacco Use  Smoking Status Former Smoker  . Packs/day: 0.25  . Years: 5.00  . Pack years: 1.25  . Types: Cigarettes  . Quit date: 65  . Years since quitting: 26.5  Smokeless Tobacco Never Used    Diagnosis:  S/P CABG x 4  ADL UCSD:   Initial Exercise Prescription:  Initial Exercise Prescription - 10/18/19 1300      Date of Initial Exercise RX and Referring Provider   Date 10/18/19    Referring Provider Arida      Treadmill   MPH 2    Grade 0    Minutes 15    METs 2.5      Recumbant Bike   Level 2    RPM 60    Minutes 15    METs 2.2      Arm Ergometer   Level 1    RPM 30    Minutes 15    METs 2.2      REL-XR   Level 2    Speed 50    Minutes 15    METs 2.2      Prescription Details   Frequency (times per week) 3    Duration Progress to 30 minutes of continuous aerobic without signs/symptoms of physical distress      Intensity   THRR 40-80% of Max Heartrate 95-130    Ratings of Perceived Exertion 11-13    Perceived Dyspnea 0-4      Resistance Training   Training Prescription Yes    Weight 3 lb    Reps 10-15           Discharge Exercise Prescription (Final Exercise Prescription Changes):  Exercise Prescription Changes - 02/19/20 1400      Response to Exercise   Blood Pressure (Admit) 124/64    Blood Pressure (Exercise) 150/72    Blood Pressure (Exit) 122/66    Heart Rate (Admit) 73 bpm    Heart Rate (Exercise) 88 bpm    Heart Rate (Exit) 66 bpm    Rating of Perceived Exertion (Exercise) 12     Symptoms none    Duration Continue with 30 min of aerobic exercise without signs/symptoms of physical distress.    Intensity THRR unchanged      Progression   Progression Continue to progress workloads to maintain intensity without signs/symptoms of physical distress.    Average METs 2.43      Resistance Training   Training Prescription Yes    Weight 4 lb    Reps 10-15      Interval Training   Interval Training No      Treadmill   MPH 2    Grade 1    Minutes 15    METs 2.81      Recumbant Bike   Level 2    Watts 26    Minutes 15    METs 3.12      NuStep   Level 5    Minutes 15    METs 2.1  T5 Nustep   Level 3    Minutes 15    METs 2      Home Exercise Plan   Plans to continue exercise at [facility] (comment)    Frequency Add 2 additional days to program exercise sessions.    Initial Home Exercises Provided 11/16/19           Functional Capacity:  6 Minute Walk    Row Name 10/18/19 1308 02/15/20 0734       6 Minute Walk   Phase Initial Discharge    Distance 1172 feet 1310 feet    Distance % Change -- 11.7 %    Distance Feet Change -- 138 ft    Walk Time 6 minutes 6 minutes    # of Rest Breaks 0 0    MPH 2.22 2.48    METS 2.22 2.37    RPE 7 11    Perceived Dyspnea  0 --    VO2 Peak 7.8 8.29    Symptoms No No    Resting HR 60 bpm 73 bpm    Resting BP 112/60 124/60    Resting Oxygen Saturation  100 % --    Exercise Oxygen Saturation  during 6 min walk 98 % --    Max Ex. HR 91 bpm 88 bpm    Max Ex. BP 146/64 150/72    2 Minute Post BP 120/66 --           Psychological, QOL, Others - Outcomes: PHQ 2/9: Depression screen [facility] 2/9 11/28/2019 10/18/2019  Decreased Interest 0 1  Down, Depressed, Hopeless 0 1  PHQ - 2 Score 0 2  Altered sleeping 1 1  Tired, decreased energy 1 1  Change in appetite 0 1  Feeling bad or failure about yourself  0 0  Trouble concentrating 0 1  Moving slowly or fidgety/restless 0 1  Suicidal  thoughts 0 0  PHQ-9 Score 2 7  Difficult doing work/chores Not difficult at all Somewhat difficult    Quality of Life:  Quality of Life - 10/18/19 1320      Quality of Life   Select Quality of Life      Quality of Life Scores   Health/Function Pre 28.8 %    Socioeconomic Pre 29.14 %    Psych/Spiritual Post 30 %    Family Pre 30 %           Personal Goals: Goals established at orientation with interventions provided to work toward goal.  Personal Goals and Risk Factors at Admission - 10/18/19 1319      Core Components/Risk Factors/Patient Goals on Admission    Weight Management Yes;Weight Maintenance;Weight Loss    Admit Weight 201 lb 14.4 oz (91.6 kg)    Goal Weight: Short Term 190 lb (86.2 kg)    Goal Weight: Long Term 180 lb (81.6 kg)    Expected Outcomes Short Term: Continue to assess and modify interventions until short term weight is achieved;Long Term: Adherence to nutrition and physical activity/exercise program aimed toward attainment of established weight goal;Weight Loss: Understanding of general recommendations for a balanced deficit meal plan, which promotes 1-2 lb weight loss per week and includes a negative energy balance of [phone] kcal/d;Understanding recommendations for meals to include 15-35% energy as protein, 25-35% energy from fat, 35-60% energy from carbohydrates, less than 243m of dietary cholesterol, 20-35 gm of total fiber daily;Understanding of distribution of calorie intake throughout the day with the consumption of 4-5 meals/snacks  Diabetes Yes    Intervention Provide education about signs/symptoms and action to take for hypo/hyperglycemia.;Provide education about proper nutrition, including hydration, and aerobic/resistive exercise prescription along with prescribed medications to achieve blood glucose in normal ranges: Fasting glucose 65-99 mg/dL    Expected Outcomes Short Term: Participant verbalizes understanding of the signs/symptoms and  immediate care of hyper/hypoglycemia, proper foot care and importance of medication, aerobic/resistive exercise and nutrition plan for blood glucose control.;Long Term: Attainment of HbA1C < 7%.            Personal Goals Discharge:  Goals and Risk Factor Review    Row Name 11/23/19 0748 12/12/19 0809 01/09/20 0745 01/30/20 0809 02/01/20 0743     Core Components/Risk Factors/Patient Goals Review   Personal Goals Review Weight Management/Obesity;Hypertension;Lipids;Diabetes Weight Management/Obesity;Hypertension;Lipids;Diabetes Weight Management/Obesity;Hypertension;Lipids;Diabetes Weight Management/Obesity;Hypertension;Lipids;Diabetes Weight Management/Obesity;Hypertension;Lipids;Diabetes   Review Brian Chapman is taking all meds as directed.  He takes his BP a couple times per week.  He doesn't regularly check BG at home.  He feels more limbered up by coming to exercise.  He likes the routine of the program. Brian Chapman says his weight fluctuates on our scale.  He is taking meds as directed.  He walks a lot at work but hasnt exercised outside program sessions.  He checks BP every night and it has been fine. Brian Chapman continues to check BP at home.  He is taking all meds as directed.  He hasnt been able to exercise outside HT sessions.  We reviewed THR range/RPE. Brian Chapman is taking meds as directed and monitroing BP at home.  He plans to exercise at [employer] when he completes HT. Brian Chapman feels better than he ever has and in the habit of checking things.  He said if he didn't have the scars he wouldn't know that anything had happened.   Expected Outcomes Short : check with Dr about how often to monitor BG Long:  manage risk factors Short: continue to monitor risk factos Long: keep BP/BG in acceptable range Short:  continue to monitor risk factors Long:  maintain heart health long term Short: continue to monitor risk factors Long: maintain heart health Short: continue to monitor risk factors Long: maintain heart health           Exercise Goals and Review:  Exercise Goals    Row Name 10/18/19 1317             Exercise Goals   Increase Physical Activity Yes       Intervention Provide advice, education, support and counseling about physical activity/exercise needs.;Develop an individualized exercise prescription for aerobic and resistive training based on initial evaluation findings, risk stratification, comorbidities and participant's personal goals.       Expected Outcomes Short Term: Attend rehab on a regular basis to increase amount of physical activity.;Long Term: Add in home exercise to make exercise part of routine and to increase amount of physical activity.;Long Term: Exercising regularly at least 3-5 days a week.       Increase Strength and Stamina Yes       Intervention Provide advice, education, support and counseling about physical activity/exercise needs.;Develop an individualized exercise prescription for aerobic and resistive training based on initial evaluation findings, risk stratification, comorbidities and participant's personal goals.       Expected Outcomes Short Term: Increase workloads from initial exercise prescription for resistance, speed, and METs.;Short Term: Perform resistance training exercises routinely during rehab and add in resistance training at home;Long Term: Improve cardiorespiratory fitness, muscular endurance and strength as  measured by increased METs and functional capacity (6MWT)       Able to understand and use rate of perceived exertion (RPE) scale Yes       Intervention Provide education and explanation on how to use RPE scale       Expected Outcomes Short Term: Able to use RPE daily in rehab to express subjective intensity level;Long Term:  Able to use RPE to guide intensity level when exercising independently       Able to understand and use Dyspnea scale Yes       Intervention Provide education and explanation on how to use Dyspnea scale       Expected Outcomes  Short Term: Able to use Dyspnea scale daily in rehab to express subjective sense of shortness of breath during exertion;Long Term: Able to use Dyspnea scale to guide intensity level when exercising independently       Knowledge and understanding of Target Heart Rate Range (THRR) Yes       Intervention Provide education and explanation of THRR including how the numbers were predicted and where they are located for reference       Expected Outcomes Short Term: Able to state/look up THRR;Short Term: Able to use daily as guideline for intensity in rehab;Long Term: Able to use THRR to govern intensity when exercising independently       Able to check pulse independently Yes       Intervention Provide education and demonstration on how to check pulse in carotid and radial arteries.;Review the importance of being able to check your own pulse for safety during independent exercise       Expected Outcomes Short Term: Able to explain why pulse checking is important during independent exercise;Long Term: Able to check pulse independently and accurately       Understanding of Exercise Prescription Yes       Intervention Provide education, explanation, and written materials on patient's individual exercise prescription       Expected Outcomes Short Term: Able to explain program exercise prescription;Long Term: Able to explain home exercise prescription to exercise independently              Exercise Goals Re-Evaluation:  Exercise Goals Re-Evaluation    Row Name 10/24/19 0831 11/06/19 1343 11/16/19 1309 11/21/19 1342 12/04/19 1544     Exercise Goal Re-Evaluation   Exercise Goals Review Able to understand and use rate of perceived exertion (RPE) scale;Knowledge and understanding of Target Heart Rate Range (THRR);Understanding of Exercise Prescription Increase Physical Activity;Increase Strength and Stamina;Understanding of Exercise Prescription Increase Physical Activity;Increase Strength and Stamina;Able to  understand and use rate of perceived exertion (RPE) scale;Able to understand and use Dyspnea scale;Knowledge and understanding of Target Heart Rate Range (THRR);Able to check pulse independently;Understanding of Exercise Prescription Increase Physical Activity;Increase Strength and Stamina;Able to understand and use rate of perceived exertion (RPE) scale;Able to understand and use Dyspnea scale;Knowledge and understanding of Target Heart Rate Range (THRR);Able to check pulse independently;Understanding of Exercise Prescription Increase Physical Activity;Increase Strength and Stamina;Understanding of Exercise Prescription   Comments Reviewed RPE scale, THR and program prescription with pt today.  Pt voiced understanding and was given a copy of goals to take home. Brian Chapman is off to a good start in rehab.  He has completed his first four full days of exercise.  We will continue to monitor his progress. eviewed home exercise with pt today.  Pt plans to go to [employer] for exercise.  Reviewed THR, pulse, RPE,  sign and symptoms, NTG use, and when to call 911 or MD.  Also discussed weather considerations and indoor options.  Pt voiced understanding. Brian Chapman has moved up to 4 lb for strength work.  He has a Higher education careers adviser at [employer] and plans to exercise there on weekends. Brian Chapman is doing well in rehab.  He is now on level 2 for the arm crank. We will continue to monitor his progress.   Expected Outcomes Short: Use RPE daily to regulate intensity. Long: Follow program prescription in THR. Short: Continue to attend regularly  Long: Continue to follow program prescription. Short : exercsie 1-2 days outside HT LOng: maintain exercise on his own Short: add in exercise on weekends Long: increase stamina Short: Increase workload on XR  Long: Continue to improve stamina   Row Name 12/12/19 0815 12/25/19 1621 01/09/20 0748 01/25/20 1435 01/30/20 0813     Exercise Goal Re-Evaluation   Exercise Goals Review Increase Physical  Activity;Increase Strength and Stamina;Able to understand and use rate of perceived exertion (RPE) scale;Able to understand and use Dyspnea scale;Knowledge and understanding of Target Heart Rate Range (THRR);Able to check pulse independently;Understanding of Exercise Prescription Increase Physical Activity;Increase Strength and Stamina;Understanding of Exercise Prescription Increase Physical Activity;Increase Strength and Stamina;Understanding of Exercise Prescription Increase Physical Activity;Increase Strength and Stamina;Understanding of Exercise Prescription Increase Physical Activity;Increase Strength and Stamina;Understanding of Exercise Prescription   Comments We discussed adding on emore day of exercise on weekends. Brian Chapman is doing well in rehab.  He is out this week for vacation on a trout fishing trip.  He was looking forward to it and hopes to get in some walking as well.  He is up to 2.2 mph on the treadmill.  We will continue to monitor his progress. Brian Chapman works 8-10 hours a day and walks a lot at work.  He is tired by the end of the day.  He plans to go to [employer] before work when he finishes HT. Brian Chapman has been doing well in rehab.  He is now on level 5 of the NuStep and at 2.8 METs on the bike.  We will continue to monitor his progress. Brian Chapman plans to workout at [employer] once he completes HT.   Expected Outcomes Short:  add one day of exercise on weekends Long: exercise 3-5 days per week Short: Walk while on vacation  Long: Continue to improve stamina Short:  add exercise on weekends Long: maintian exercise on his own Short: Continue to add in more exercise at home Long: Continue to improve stamina. Short:  attend HT consistently Long:  increase MET level   Row Name 02/01/20 0737 02/06/20 1507 02/19/20 1436         Exercise Goal Re-Evaluation   Exercise Goals Review Increase Physical Activity;Increase Strength and Stamina;Understanding of Exercise Prescription Increase Physical  Activity;Increase Strength and Stamina;Understanding of Exercise Prescription Increase Physical Activity;Increase Strength and Stamina;Understanding of Exercise Prescription     Comments Brian Chapman is doing well in rehab. He is feeling good with exercise.  He stays busy with work and church.  He is still planning to join [employer] as it is just down the street once he graduates. He is feeling good overall. Brian Chapman continues to yield good results from rehab. Continue to encourage exercise indepedently with appropriate HR ranges. Will continue to monitor. Brian Chapman is nearing graduation.  He improved his post 6MWT by more than 11%!  He is planning to continue to exericse by walking and getting treadmill for home use.  We will continue to monitor progress to graduation.     Expected Outcomes Short: Continue to work toward graduation and add in walking at home.  Long; [employer]. Short: Increase days of walking outside of rehab. Long:  Increase strength/ stamina and continue to progress MET level Short: graduate  Long; Continue to exercise independently            Nutrition & Weight - Outcomes:  Pre Biometrics - 10/18/19 1318      Pre Biometrics   Height _0  (1.676 m)    Weight 201 lb 14.4 oz (91.6 kg)    BMI (Calculated) 32.6           Post Biometrics - 02/15/20 0735       Post  Biometrics   Height _1  (1.676 m)    Weight 211 lb 6.4 oz (95.9 kg)    BMI (Calculated) 34.14    Single Leg Stand 3.32 seconds           Nutrition:  Nutrition Therapy & Goals - 11/20/19 0701      Nutrition Therapy   Diet HH, Low Na    Protein (specify units) 75-80g    Fiber 30 grams    Whole Grain Foods 3 servings    Saturated Fats 12 max. grams    Fruits and Vegetables 5 servings/day    Sodium 1.5 grams      Personal Nutrition Goals   Nutrition Goal ST: continue current diet LT: enhance longevity    Comments awake 330-430am B: coffee, toast or english muffin with butter, jelly, or peanut  butter B2: coffee (sometimes) Lor S: sandwich ([place] on wrap) or some peanut butter crackers D: wife cooks or leftovers. Meat, vegetables. Pt reports weight stable for 30 years. Pt reports for snacks will also have trail mix and fruit. Pt reports having a variety of vegetables and using HH methods of cooking. Suggested whole grains for breads and wraps. Discussed HH eating. Pt reports eating intuitively.      Intervention Plan   Intervention Prescribe, educate and counsel regarding individualized specific dietary modifications aiming towards targeted core components such as weight, hypertension, lipid management, diabetes, heart failure and other comorbidities.;Nutrition handout(s) given to patient.    Expected Outcomes Short Term Goal: Understand basic principles of dietary content, such as calories, fat, sodium, cholesterol and nutrients.;Short Term Goal: A plan has been developed with personal nutrition goals set during dietitian appointment.;Long Term Goal: Adherence to prescribed nutrition plan.           Nutrition Discharge:  Nutrition Assessments - 10/19/19 1124      MEDFICTS Scores   Pre Score 99           Education Questionnaire Score:  Knowledge Questionnaire Score - 10/18/19 1320      Knowledge Questionnaire Score   Pre Score 24/26           Goals reviewed with patient; copy given to patient.

## 2020-02-27 NOTE — Progress Notes (Signed)
Daily Session Note  Patient Details  Name: Brian Chapman MRN: [id number] Date of Birth: 05/16/1946 Referring Provider:     Cardiac Rehab from 10/18/2019 in [facility]  Referring Provider Arida      Encounter Date: 02/27/2020  Check In:  Session Check In - 02/27/20 0730      Check-In   Supervising physician immediately available to respond to emergencies See telemetry face sheet for immediately available ER MD    Location [facility]Cardiac & Pulmonary Rehab    Staff Present Brian Jo Abernethy, RN, BSN, MA;Brian Langdon, MS Exercise Physiologist    Virtual Visit No    Medication changes reported     No    Fall or balance concerns reported    No    Warm-up and Cool-down Performed on first and last piece of equipment    Resistance Training Performed Yes    VAD Patient? No    PAD/SET Patient? No      Pain Assessment   Currently in Pain? No/denies              Social History   Tobacco Use  Smoking Status Former Smoker  . Packs/day: 0.25  . Years: 5.00  . Pack years: 1.25  . Types: Cigarettes  . Quit date: 1995  . Years since quitting: 26.5  Smokeless Tobacco Never Used    Goals Met:  Independence with exercise equipment Exercise tolerated well Personal goals reviewed No report of cardiac concerns or symptoms Strength training completed today  Goals Unmet:  Not Applicable  Comments:  Brian Chapman graduated today from  rehab with 36 sessions completed.  Details of the patient's exercise prescription and what He needs to do in order to continue the prescription and progress were discussed with patient.  Patient was given a copy of prescription and goals.  Patient verbalized understanding.  Brian Chapman plans to continue to exercise by going to [employer].    Dr. Mark Miller is Medical Director for [facility] and [name]. 

## 2020-02-27 NOTE — Progress Notes (Signed)
Daily Session Note  Patient Details  Name: Brian Chapman MRN: [id number] Date of Birth: June 14, 1946 Referring Provider:     Cardiac Rehab from 10/18/2019 in [facility]  Referring Provider Arida      Encounter Date: 02/27/2020  Check In:  Session Check In - 02/27/20 0744      Check-In   Supervising physician immediately available to respond to emergencies See telemetry face sheet for immediately available ER MD    Location [facility]    Staff Present Nyoka Cowden, RN, BSN, Tyna Jaksch, MS Exercise Physiologist;Amanda Oletta Darter, [place], ACSM CEP, Exercise Physiologist    Virtual Visit No    Medication changes reported     No    Fall or balance concerns reported    No    Tobacco Cessation No Change    Warm-up and Cool-down Performed on first and last piece of equipment    Resistance Training Performed Yes    VAD Patient? No      Pain Assessment   Currently in Pain? No/denies              Social History   Tobacco Use  Smoking Status Former Smoker  . Packs/day: 0.25  . Years: 5.00  . Pack years: 1.25  . Types: Cigarettes  . Quit date: 17  . Years since quitting: 26.5  Smokeless Tobacco Never Used    Goals Met:  Independence with exercise equipment Exercise tolerated well No report of cardiac concerns or symptoms Strength training completed today  Goals Unmet:  Not Applicable  Comments: Pt able to follow exercise prescription today without complaint.  Will continue to monitor for progression.    Dr. Emily Filbert is Medical Director for [place] and [name].

## 2020-02-27 NOTE — Patient Instructions (Signed)
Discharge Patient Instructions  Patient Details  Name: Brian Chapman MRN: [id number] Date of Birth: Jul 19, 1946 Referring Provider:  [facility]ration, [facility]   Number of Visits: 36  Reason for Discharge:  Patient reached a stable level of exercise. Patient independent in their exercise. Patient has met program and personal goals.  Smoking History:  Social History   Tobacco Use  Smoking Status Former Smoker  . Packs/day: 0.25  . Years: 5.00  . Pack years: 1.25  . Types: Cigarettes  . Quit date: 65  . Years since quitting: 26.5  Smokeless Tobacco Never Used    Diagnosis:  S/P CABG x 4  Initial Exercise Prescription:  Initial Exercise Prescription - 10/18/19 1300      Date of Initial Exercise RX and Referring Provider   Date 10/18/19    Referring Provider Arida      Treadmill   MPH 2    Grade 0    Minutes 15    METs 2.5      Recumbant Bike   Level 2    RPM 60    Minutes 15    METs 2.2      Arm Ergometer   Level 1    RPM 30    Minutes 15    METs 2.2      REL-XR   Level 2    Speed 50    Minutes 15    METs 2.2      Prescription Details   Frequency (times per week) 3    Duration Progress to 30 minutes of continuous aerobic without signs/symptoms of physical distress      Intensity   THRR 40-80% of Max Heartrate 95-130    Ratings of Perceived Exertion 11-13    Perceived Dyspnea 0-4      Resistance Training   Training Prescription Yes    Weight 3 lb    Reps 10-15           Discharge Exercise Prescription (Final Exercise Prescription Changes):  Exercise Prescription Changes - 02/19/20 1400      Response to Exercise   Blood Pressure (Admit) 124/64    Blood Pressure (Exercise) 150/72    Blood Pressure (Exit) 122/66    Heart Rate (Admit) 73 bpm    Heart Rate (Exercise) 88 bpm    Heart Rate (Exit) 66 bpm    Rating of Perceived Exertion (Exercise) 12    Symptoms none    Duration Continue with 30 min of aerobic exercise without  signs/symptoms of physical distress.    Intensity THRR unchanged      Progression   Progression Continue to progress workloads to maintain intensity without signs/symptoms of physical distress.    Average METs 2.43      Resistance Training   Training Prescription Yes    Weight 4 lb    Reps 10-15      Interval Training   Interval Training No      Treadmill   MPH 2    Grade 1    Minutes 15    METs 2.81      Recumbant Bike   Level 2    Watts 26    Minutes 15    METs 3.12      NuStep   Level 5    Minutes 15    METs 2.1      T5 Nustep   Level 3    Minutes 15    METs 2      Home  Exercise Plan   Plans to continue exercise at [facility] (comment)    Frequency Add 2 additional days to program exercise sessions.    Initial Home Exercises Provided 11/16/19           Functional Capacity:  6 Minute Walk    Row Name 10/18/19 1308 02/15/20 0734       6 Minute Walk   Phase Initial Discharge    Distance 1172 feet 1310 feet    Distance % Change -- 11.7 %    Distance Feet Change -- 138 ft    Walk Time 6 minutes 6 minutes    # of Rest Breaks 0 0    MPH 2.22 2.48    METS 2.22 2.37    RPE 7 11    Perceived Dyspnea  0 --    VO2 Peak 7.8 8.29    Symptoms No No    Resting HR 60 bpm 73 bpm    Resting BP 112/60 124/60    Resting Oxygen Saturation  100 % --    Exercise Oxygen Saturation  during 6 min walk 98 % --    Max Ex. HR 91 bpm 88 bpm    Max Ex. BP 146/64 150/72    2 Minute Post BP 120/66 --             Nutrition & Weight - Outcomes:  Pre Biometrics - 10/18/19 1318      Pre Biometrics   Height _0  (1.676 m)    Weight 201 lb 14.4 oz (91.6 kg)    BMI (Calculated) 32.6           Post Biometrics - 02/15/20 0735       Post  Biometrics   Height _1  (1.676 m)    Weight 211 lb 6.4 oz (95.9 kg)    BMI (Calculated) 34.14    Single Leg Stand 3.32 seconds           Nutrition:  Nutrition Therapy & Goals - 11/20/19 0701      Nutrition  Therapy   Diet HH, Low Na    Protein (specify units) 75-80g    Fiber 30 grams    Whole Grain Foods 3 servings    Saturated Fats 12 max. grams    Fruits and Vegetables 5 servings/day    Sodium 1.5 grams      Personal Nutrition Goals   Nutrition Goal ST: continue current diet LT: enhance longevity    Comments awake 330-430am B: coffee, toast or english muffin with butter, jelly, or peanut butter B2: coffee (sometimes) Lor S: sandwich ([place] on wrap) or some peanut butter crackers D: wife cooks or leftovers. Meat, vegetables. Pt reports weight stable for 30 years. Pt reports for snacks will also have trail mix and fruit. Pt reports having a variety of vegetables and using HH methods of cooking. Suggested whole grains for breads and wraps. Discussed HH eating. Pt reports eating intuitively.      Intervention Plan   Intervention Prescribe, educate and counsel regarding individualized specific dietary modifications aiming towards targeted core components such as weight, hypertension, lipid management, diabetes, heart failure and other comorbidities.;Nutrition handout(s) given to patient.    Expected Outcomes Short Term Goal: Understand basic principles of dietary content, such as calories, fat, sodium, cholesterol and nutrients.;Short Term Goal: A plan has been developed with personal nutrition goals set during dietitian appointment.;Long Term Goal: Adherence to prescribed nutrition plan.  Nutrition Discharge:  Nutrition Assessments - 10/19/19 1124      MEDFICTS Scores   Pre Score 99           Education Questionnaire Score:  Knowledge Questionnaire Score - 10/18/19 1320      Knowledge Questionnaire Score   Pre Score 24/26           Goals reviewed with patient; copy given to patient.

## 2020-02-27 NOTE — Progress Notes (Signed)
Cardiac Individual Treatment Plan  Patient Details  Name: Brian Chapman MRN: [id number] Date of Birth: Jan 27, 1946 Referring Provider:     Cardiac Rehab from 10/18/2019 in [facility] Cardiac and Pulmonary Rehab  Referring Provider Arida      Initial Encounter Date:    Cardiac Rehab from 10/18/2019 in [facility] Cardiac and Pulmonary Rehab  Date 10/18/19      Visit Diagnosis: S/P CABG x 4  Patient's Home Medications on Admission:  Current Outpatient Medications:  .  allopurinol (ZYLOPRIM) 300 MG tablet, Take 300 mg by mouth daily., Disp: , Rfl:  .  aspirin EC 81 MG tablet, Take 81 mg by mouth daily., Disp: , Rfl:  .  atorvastatin (LIPITOR) 80 MG tablet, Take 80 mg by mouth daily., Disp: , Rfl:  .  betamethasone valerate lotion (VALISONE) 0.1 %, Apply 1 application topically daily as needed for irritation., Disp: , Rfl:  .  chlorthalidone (HYGROTON) 25 MG tablet, Take 0.5 tablets (12.5 mg total) by mouth daily., Disp: 15 tablet, Rfl: 5 .  clopidogrel (PLAVIX) 75 MG tablet, Take 1 tablet (75 mg total) by mouth daily., Disp: 30 tablet, Rfl: 2 .  clopidogrel (PLAVIX) 75 MG tablet, Take 1 tablet (75 mg total) by mouth daily., Disp: 90 tablet, Rfl: 3 .  colchicine 0.6 MG tablet, Take 1 tablet (0.6 mg total) by mouth daily., Disp: 30 tablet, Rfl: 0 .  cyclobenzaprine (FLEXERIL) 5 MG tablet, Take 1 tablet (5 mg total) by mouth 3 (three) times daily as needed for muscle spasms., Disp: 15 tablet, Rfl: 0 .  desonide (DESOWEN) 0.05 % cream, Apply 1 application topically 2 (two) times daily., Disp: , Rfl:  .  diclofenac Sodium (VOLTAREN) 1 % GEL, Apply 4 g topically 4 (four) times daily., Disp: , Rfl:  .  glucose blood test strip, Test blood ac and hs...the patient has been instructed., Disp: 100 each, Rfl: 12 .  indomethacin (INDOCIN) 50 MG capsule, Take 50 mg by mouth daily as needed. Gout pain, Disp: , Rfl:  .  ketoconazole (NIZORAL) 2 % shampoo, Apply 1 application topically 2 (two) times a week.,  Disp: , Rfl:  .  Lancets (ONETOUCH ULTRASOFT) lancets, Use as instructed...test ac and hs., Disp: 100 each, Rfl: 12 .  metFORMIN (GLUCOPHAGE) 500 MG tablet, Take 500 mg by mouth daily with breakfast., Disp: , Rfl:  .  metoprolol tartrate (LOPRESSOR) 25 MG tablet, Take 12.5 mg by mouth 2 (two) times daily., Disp: , Rfl:  .  nitroGLYCERIN (NITROSTAT) 0.4 MG SL tablet, Place 1 tablet (0.4 mg total) under the tongue every 5 (five) minutes as needed for chest pain., Disp: 25 tablet, Rfl: 3  Past Medical History: Past Medical History:  Diagnosis Date  . CAD (coronary artery disease)    severe distal left main stenosis with plaque rupture, extending into the ostial LAD and occluded distal LAD, likely from embolization.  He had mild to moderate pRCA stenosis. He underwent POBA of distal LAD  . Diabetes mellitus without complication ([place])   . HFrEF (heart failure with reduced ejection fraction) (HCC)    2/6 TTE 45-50%, severe LVH, G1DD  . History of tobacco use   . Hyperlipidemia   . Hypertension   . OSA on CPAP    wears CPAP qHS per pt  . PAC (premature atrial contraction)    PAC verus post cabg AF on amiodarone s/p cath. Not on [place].  . S/P CABG x 4    2/7 LIMA-LAD, SVG-diagonal, SVG-OM1  to OM2    Tobacco Use: Social History   Tobacco Use  Smoking Status Former Smoker  . Packs/day: 0.25  . Years: 5.00  . Pack years: 1.25  . Types: Cigarettes  . Quit date: 43  . Years since quitting: 26.5  Smokeless Tobacco Never Used    Labs: Recent Hydrographic surveyor    Labs for ITP Cardiac and Pulmonary Rehab Latest Ref Rng & Units 09/18/2019 09/18/2019 09/20/2019 09/21/2019 11/29/2019   Cholestrol 100 - 199 mg/dL - - - - 77(O)   LDLCALC 0 - 99 mg/dL - - - - 40   HDL >24 mg/dL - - - - 20(Q)   Trlycerides 0 - 149 mg/dL - - - - 299   Hemoglobin A1c 4.8 - 5.6 % - - 6.5(H) 6.6(H) -   PHART 7.35 - 7.45 7.320(L) 7.302(L) - - -   PCO2ART 32 - 48 mmHg 42.0 44.7 - - -   HCO3 20.0 - 28.0 mmol/L 21.3  21.7 - - -   TCO2 22 - 32 mmol/L 22 23 - - -   ACIDBASEDEF 0.0 - 2.0 mmol/L 4.0(H) 4.0(H) - - -   O2SAT % 95.0 92.0 - - -       Exercise Target Goals: Exercise Program Goal: Individual exercise prescription set using results from initial 6 min walk test and THRR while considering  patient's activity barriers and safety.   Exercise Prescription Goal: Initial exercise prescription builds to 30-45 minutes a day of aerobic activity, 2-3 days per week.  Home exercise guidelines will be given to patient during program as part of exercise prescription that the participant will acknowledge.   Education: Aerobic Exercise & Resistance Training: - Gives group verbal and written instruction on the various components of exercise. Focuses on aerobic and resistive training programs and the benefits of this training and how to safely progress through these programs..   Education: Exercise & Equipment Safety: - Individual verbal instruction and demonstration of equipment use and safety with use of the equipment.   Cardiac Rehab from 02/22/2020 in [facility] Cardiac and Pulmonary Rehab  Date 10/18/19  Educator AS  Instruction Review Code 1- Verbalizes Understanding      Education: Exercise Physiology & General Exercise Guidelines: - Group verbal and written instruction with models to review the exercise physiology of the cardiovascular system and associated critical values. Provides general exercise guidelines with specific guidelines to those with heart or lung disease.    Cardiac Rehab from 02/22/2020 in [facility] Cardiac and Pulmonary Rehab  Date 02/22/20  Educator [facility]  Instruction Review Code 1- Verbalizes Understanding      Education: Flexibility, Balance, Mind/Body Relaxation: Provides group verbal/written instruction on the benefits of flexibility and balance training, including mind/body exercise modes such as yoga, pilates and tai chi.  Demonstration and skill practice provided.   Activity  Barriers & Risk Stratification:   6 Minute Walk:  6 Minute Walk    Row Name 10/18/19 1308 02/15/20 0734       6 Minute Walk   Phase Initial Discharge    Distance 1172 feet 1310 feet    Distance % Change -- 11.7 %    Distance Feet Change -- 138 ft    Walk Time 6 minutes 6 minutes    # of Rest Breaks 0 0    MPH 2.22 2.48    METS 2.22 2.37    RPE 7 11    Perceived Dyspnea  0 --    VO2 Peak  7.8 8.29    Symptoms No No    Resting HR 60 bpm 73 bpm    Resting BP 112/60 124/60    Resting Oxygen Saturation  100 % --    Exercise Oxygen Saturation  during 6 min walk 98 % --    Max Ex. HR 91 bpm 88 bpm    Max Ex. BP 146/64 150/72    2 Minute Post BP 120/66 --           Oxygen Initial Assessment:   Oxygen Re-Evaluation:   Oxygen Discharge (Final Oxygen Re-Evaluation):   Initial Exercise Prescription:  Initial Exercise Prescription - 10/18/19 1300      Date of Initial Exercise RX and Referring Provider   Date 10/18/19    Referring Provider Arida      Treadmill   MPH 2    Grade 0    Minutes 15    METs 2.5      Recumbant Bike   Level 2    RPM 60    Minutes 15    METs 2.2      Arm Ergometer   Level 1    RPM 30    Minutes 15    METs 2.2      REL-XR   Level 2    Speed 50    Minutes 15    METs 2.2      Prescription Details   Frequency (times per week) 3    Duration Progress to 30 minutes of continuous aerobic without signs/symptoms of physical distress      Intensity   THRR 40-80% of Max Heartrate 95-130    Ratings of Perceived Exertion 11-13    Perceived Dyspnea 0-4      Resistance Training   Training Prescription Yes    Weight 3 lb    Reps 10-15           Perform Capillary Blood Glucose checks as needed.  Exercise Prescription Changes:  Exercise Prescription Changes    Row Name 10/18/19 1300 10/24/19 1100 11/06/19 1300 11/16/19 1300 11/21/19 1300     Response to Exercise   Blood Pressure (Admit) 112/60 124/60 122/70 -- 130/68   Blood  Pressure (Exercise) 146/64 138/70 144/74 -- 122/62   Blood Pressure (Exit) 120/66 125/58 124/70 -- 106/58   Heart Rate (Admit) 60 bpm 78 bpm 66 bpm -- 40 bpm   Heart Rate (Exercise) 91 bpm 93 bpm 100 bpm -- 117 bpm   Heart Rate (Exit) 67 bpm 71 bpm 65 bpm -- 57 bpm   Oxygen Saturation (Admit) 100 % -- -- -- --   Oxygen Saturation (Exercise) 98 % -- -- -- --   Rating of Perceived Exertion (Exercise) '7 13 12 '$ -- 12   Perceived Dyspnea (Exercise) 0 -- -- -- --   Symptoms none none none -- none   Duration -- Continue with 30 min of aerobic exercise without signs/symptoms of physical distress. Continue with 30 min of aerobic exercise without signs/symptoms of physical distress. -- Continue with 30 min of aerobic exercise without signs/symptoms of physical distress.   Intensity -- THRR unchanged THRR unchanged -- THRR unchanged     Progression   Progression -- -- Continue to progress workloads to maintain intensity without signs/symptoms of physical distress. -- Continue to progress workloads to maintain intensity without signs/symptoms of physical distress.   Average METs -- -- 2.49 -- 2.6     Resistance Training   Training Prescription -- Yes Yes -- Yes  Weight -- 3 lb 3 lb -- 4 lb   Reps -- 10-15 10-15 -- 10-15     Interval Training   Interval Training -- No No -- No     Treadmill   MPH -- 2 2 -- 1.8   Grade -- 0 0.5 -- 0   Minutes -- 15 15 -- 15   METs -- 2.5 2.67 -- 2.38     Recumbant Bike   Level -- -- 2 -- --   Minutes -- -- 15 -- --     Arm Ergometer   Level -- -- 1 -- --   Minutes -- -- 15 -- --   METs -- -- 2.3 -- --     Elliptical   Level -- 1 -- -- --   Minutes -- 15 -- -- --     REL-XR   Level -- -- -- -- 2   Speed -- -- -- -- 50   Minutes -- -- -- -- 15   METs -- -- -- -- 2.8     Home Exercise Plan   Plans to continue exercise at -- -- -- [employer] (comment) Forensic scientist (comment)   Frequency -- -- -- Add 2 additional days to program exercise  sessions. Add 2 additional days to program exercise sessions.   Initial Home Exercises Provided -- -- -- 11/16/19 11/16/19   Row Name 12/04/19 1500 12/12/19 1600 12/25/19 1600 01/11/20 0900 01/25/20 1400     Response to Exercise   Blood Pressure (Admit) 134/66 120/72 142/78 136/70 118/64   Blood Pressure (Exercise) 124/64 128/80 130/74 132/66 130/78   Blood Pressure (Exit) 126/64 112/64 122/62 114/66 118/64   Heart Rate (Admit) 61 bpm 66 bpm 63 bpm 81 bpm 66 bpm   Heart Rate (Exercise) 93 bpm 71 bpm 96 bpm 97 bpm 95 bpm   Heart Rate (Exit) 66 bpm 68 bpm 62 bpm 65 bpm 66 bpm   Rating of Perceived Exertion (Exercise) '12 13 13 13 12   '$ Symptoms none none none none none   Duration Continue with 30 min of aerobic exercise without signs/symptoms of physical distress. Continue with 30 min of aerobic exercise without signs/symptoms of physical distress. Continue with 30 min of aerobic exercise without signs/symptoms of physical distress. Continue with 30 min of aerobic exercise without signs/symptoms of physical distress. Continue with 30 min of aerobic exercise without signs/symptoms of physical distress.   Intensity THRR unchanged THRR unchanged THRR unchanged THRR unchanged THRR unchanged     Progression   Progression Continue to progress workloads to maintain intensity without signs/symptoms of physical distress. Continue to progress workloads to maintain intensity without signs/symptoms of physical distress. Continue to progress workloads to maintain intensity without signs/symptoms of physical distress. Continue to progress workloads to maintain intensity without signs/symptoms of physical distress. Continue to progress workloads to maintain intensity without signs/symptoms of physical distress.   Average METs 2.29 2.5 2.68 2.4 2.64     Resistance Training   Training Prescription Yes Yes Yes Yes Yes   Weight 4 lb 4 lb 4 lb 4 lb 4 lb   Reps 10-15 10-15 10-15 10-15 10-15     Interval Training    Interval Training No -- No -- No     Treadmill   MPH 2 -- 2.'2 2 2   '$ Grade 0.5 -- 0.5 0.5 1   Minutes 15 -- '15 15 15   '$ METs 2.67 -- 2.84 2.67 2.81     Recumbant Bike  Level '2 2 2 '$ -- 2   RPM -- 60 -- -- --   Minutes '15 15 15 '$ -- 15   METs -- 2.85 3.2 -- 2.8     NuStep   Level -- -- 1 -- 5   Minutes -- -- 15 -- 15   METs -- -- 2.8 -- 2.3     Arm Ergometer   Level 2 2 -- -- --   RPM -- 30 -- -- --   Minutes 15 15 -- -- --   METs 2.1 2.2 -- -- --     REL-XR   Level 1 -- 1 -- --   Minutes 15 -- 15 -- --   METs 2.1 -- -- -- --     T5 Nustep   Level -- -- '1 3 3   '$ SPM -- -- -- 80 --   Minutes -- -- '15 15 15   '$ METs -- -- 1.9 1.9 --     Home Exercise Plan   Plans to continue exercise at [employer] (comment) -- 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 -- Add 2 additional days to program exercise sessions. Add 2 additional days to program exercise sessions. Add 2 additional days to program exercise sessions.   Initial Home Exercises Provided 11/16/19 -- 11/16/19 11/16/19 11/16/19   Row Name 02/06/20 1500 02/19/20 1400           Response to Exercise   Blood Pressure (Admit) 108/64 124/64      Blood Pressure (Exercise) 126/74 150/72      Blood Pressure (Exit) 120/66 122/66      Heart Rate (Admit) 66 bpm 73 bpm      Heart Rate (Exercise) 105 bpm 88 bpm      Heart Rate (Exit) 64 bpm 66 bpm      Rating of Perceived Exertion (Exercise) 13 12      Symptoms none none      Duration Continue with 30 min of aerobic exercise without signs/symptoms of physical distress. Continue with 30 min of aerobic exercise without signs/symptoms of physical distress.      Intensity THRR unchanged THRR unchanged        Progression   Progression Continue to progress workloads to maintain intensity without signs/symptoms of physical distress. Continue to progress workloads to maintain  intensity without signs/symptoms of physical distress.      Average METs 2.7 2.43        Resistance Training   Training Prescription Yes Yes      Weight 4 lb 4 lb      Reps 10-15 10-15        Interval Training   Interval Training No No        Treadmill   MPH 2 2      Grade 1 1      Minutes 15 15      METs 2.81 2.81        Recumbant Bike   Level 2 2      Watts -- 26      Minutes 15 15      METs 3.5 3.12        NuStep   Level 5 5      Minutes 15 15      METs 2.8 2.1        T5 Nustep   Level 3 3      Minutes 15  15      METs 2 2        Home Exercise Plan   Plans to continue exercise at [employer] (comment) [facility] (comment)      Frequency Add 2 additional days to program exercise sessions. Add 2 additional days to program exercise sessions.      Initial Home Exercises Provided 11/16/19 11/16/19             Exercise Comments:  Exercise Comments    Row Name 10/24/19 0831 11/21/19 0802 02/27/20 0731       Exercise Comments First full day of exercise!  Patient was oriented to gym and equipment including functions, settings, policies, and procedures.  Patient's individual exercise prescription and treatment plan were reviewed.  All starting workloads were established based on the results of the 6 minute walk test done at initial orientation visit.  The plan for exercise progression was also introduced and progression will be customized based on patient's performance and goals. Brian Chapman arrived to tell staff he was feeling very fatigued. He received his COVID Vaccine yesterday.  Sent him home to rest, no session today Brian Chapman graduated today from  rehab with 36 sessions completed.  Details of the patient's exercise prescription and what He needs to do in order to continue the prescription and progress were discussed with patient.  Patient was given a copy of prescription and goals.  Patient verbalized understanding.  Brian Chapman plans to continue to exercise by going to  [employer].            Exercise Goals and Review:  Exercise Goals    Row Name 10/18/19 1317             Exercise Goals   Increase Physical Activity Yes       Intervention Provide advice, education, support and counseling about physical activity/exercise needs.;Develop an individualized exercise prescription for aerobic and resistive training based on initial evaluation findings, risk stratification, comorbidities and participant's personal goals.       Expected Outcomes Short Term: Attend rehab on a regular basis to increase amount of physical activity.;Long Term: Add in home exercise to make exercise part of routine and to increase amount of physical activity.;Long Term: Exercising regularly at least 3-5 days a week.       Increase Strength and Stamina Yes       Intervention Provide advice, education, support and counseling about physical activity/exercise needs.;Develop an individualized exercise prescription for aerobic and resistive training based on initial evaluation findings, risk stratification, comorbidities and participant's personal goals.       Expected Outcomes Short Term: Increase workloads from initial exercise prescription for resistance, speed, and METs.;Short Term: Perform resistance training exercises routinely during rehab and add in resistance training at home;Long Term: Improve cardiorespiratory fitness, muscular endurance and strength as measured by increased METs and functional capacity (6MWT)       Able to understand and use rate of perceived exertion (RPE) scale Yes       Intervention Provide education and explanation on how to use RPE scale       Expected Outcomes Short Term: Able to use RPE daily in rehab to express subjective intensity level;Long Term:  Able to use RPE to guide intensity level when exercising independently       Able to understand and use Dyspnea scale Yes       Intervention Provide education and explanation on how to use Dyspnea scale  Expected Outcomes Short Term: Able to use Dyspnea scale daily in rehab to express subjective sense of shortness of breath during exertion;Long Term: Able to use Dyspnea scale to guide intensity level when exercising independently       Knowledge and understanding of Target Heart Rate Range (THRR) Yes       Intervention Provide education and explanation of THRR including how the numbers were predicted and where they are located for reference       Expected Outcomes Short Term: Able to state/look up THRR;Short Term: Able to use daily as guideline for intensity in rehab;Long Term: Able to use THRR to govern intensity when exercising independently       Able to check pulse independently Yes       Intervention Provide education and demonstration on how to check pulse in carotid and radial arteries.;Review the importance of being able to check your own pulse for safety during independent exercise       Expected Outcomes Short Term: Able to explain why pulse checking is important during independent exercise;Long Term: Able to check pulse independently and accurately       Understanding of Exercise Prescription Yes       Intervention Provide education, explanation, and written materials on patient's individual exercise prescription       Expected Outcomes Short Term: Able to explain program exercise prescription;Long Term: Able to explain home exercise prescription to exercise independently              Exercise Goals Re-Evaluation :  Exercise Goals Re-Evaluation    Row Name 10/24/19 0831 11/06/19 1343 11/16/19 1309 11/21/19 1342 12/04/19 1544     Exercise Goal Re-Evaluation   Exercise Goals Review Able to understand and use rate of perceived exertion (RPE) scale;Knowledge and understanding of Target Heart Rate Range (THRR);Understanding of Exercise Prescription Increase Physical Activity;Increase Strength and Stamina;Understanding of Exercise Prescription Increase Physical Activity;Increase Strength and  Stamina;Able to understand and use rate of perceived exertion (RPE) scale;Able to understand and use Dyspnea scale;Knowledge and understanding of Target Heart Rate Range (THRR);Able to check pulse independently;Understanding of Exercise Prescription Increase Physical Activity;Increase Strength and Stamina;Able to understand and use rate of perceived exertion (RPE) scale;Able to understand and use Dyspnea scale;Knowledge and understanding of Target Heart Rate Range (THRR);Able to check pulse independently;Understanding of Exercise Prescription Increase Physical Activity;Increase Strength and Stamina;Understanding of Exercise Prescription   Comments Reviewed RPE scale, THR and program prescription with pt today.  Pt voiced understanding and was given a copy of goals to take home. Brian Chapman is off to a good start in rehab.  He has completed his first four full days of exercise.  We will continue to monitor his progress. eviewed home exercise with pt today.  Pt plans to go to [employer] for exercise.  Reviewed THR, pulse, RPE, sign and symptoms, NTG use, and when to call 911 or MD.  Also discussed weather considerations and indoor options.  Pt voiced understanding. Brian Chapman has moved up to 4 lb for strength work.  He has a Higher education careers adviser at [employer] and plans to exercise there on weekends. Brian Chapman is doing well in rehab.  He is now on level 2 for the arm crank. We will continue to monitor his progress.   Expected Outcomes Short: Use RPE daily to regulate intensity. Long: Follow program prescription in THR. Short: Continue to attend regularly  Long: Continue to follow program prescription. Short : exercsie 1-2 days outside HT LOng: maintain exercise on his own  Short: add in exercise on weekends Long: increase stamina Short: Increase workload on XR  Long: Continue to improve stamina   Row Name 12/12/19 0815 12/25/19 1621 01/09/20 0748 01/25/20 1435 01/30/20 0813     Exercise Goal Re-Evaluation   Exercise Goals Review  Increase Physical Activity;Increase Strength and Stamina;Able to understand and use rate of perceived exertion (RPE) scale;Able to understand and use Dyspnea scale;Knowledge and understanding of Target Heart Rate Range (THRR);Able to check pulse independently;Understanding of Exercise Prescription Increase Physical Activity;Increase Strength and Stamina;Understanding of Exercise Prescription Increase Physical Activity;Increase Strength and Stamina;Understanding of Exercise Prescription Increase Physical Activity;Increase Strength and Stamina;Understanding of Exercise Prescription Increase Physical Activity;Increase Strength and Stamina;Understanding of Exercise Prescription   Comments We discussed adding on emore day of exercise on weekends. Brian Chapman is doing well in rehab.  He is out this week for vacation on a trout fishing trip.  He was looking forward to it and hopes to get in some walking as well.  He is up to 2.2 mph on the treadmill.  We will continue to monitor his progress. Brian Chapman works 8-10 hours a day and walks a lot at work.  He is tired by the end of the day.  He plans to go to [employer] before work when he finishes HT. Brian Chapman has been doing well in rehab.  He is now on level 5 of the NuStep and at 2.8 METs on the bike.  We will continue to monitor his progress. Brian Chapman plans to workout at [employer] once he completes HT.   Expected Outcomes Short:  add one day of exercise on weekends Long: exercise 3-5 days per week Short: Walk while on vacation  Long: Continue to improve stamina Short:  add exercise on weekends Long: maintian exercise on his own Short: Continue to add in more exercise at home Long: Continue to improve stamina. Short:  attend HT consistently Long:  increase MET level   Row Name 02/01/20 0737 02/06/20 1507 02/19/20 1436         Exercise Goal Re-Evaluation   Exercise Goals Review Increase Physical Activity;Increase Strength and Stamina;Understanding of Exercise Prescription  Increase Physical Activity;Increase Strength and Stamina;Understanding of Exercise Prescription Increase Physical Activity;Increase Strength and Stamina;Understanding of Exercise Prescription     Comments Brian Chapman is doing well in rehab. He is feeling good with exercise.  He stays busy with work and church.  He is still planning to join [employer] as it is just down the street once he graduates. He is feeling good overall. Brian Chapman continues to yield good results from rehab. Continue to encourage exercise indepedently with appropriate HR ranges. Will continue to monitor. Brian Chapman is nearing graduation.  He improved his post 6MWT by more than 11%!  He is planning to continue to exericse by walking and getting treadmill for home use.  We will continue to monitor progress to graduation.     Expected Outcomes Short: Continue to work toward graduation and add in walking at home.  Long; [employer]. Short: Increase days of walking outside of rehab. Long:  Increase strength/ stamina and continue to progress MET level Short: graduate  Long; Continue to exercise independently            Discharge Exercise Prescription (Final Exercise Prescription Changes):  Exercise Prescription Changes - 02/19/20 1400      Response to Exercise   Blood Pressure (Admit) 124/64    Blood Pressure (Exercise) 150/72    Blood Pressure (Exit) 122/66  Heart Rate (Admit) 73 bpm    Heart Rate (Exercise) 88 bpm    Heart Rate (Exit) 66 bpm    Rating of Perceived Exertion (Exercise) 12    Symptoms none    Duration Continue with 30 min of aerobic exercise without signs/symptoms of physical distress.    Intensity THRR unchanged      Progression   Progression Continue to progress workloads to maintain intensity without signs/symptoms of physical distress.    Average METs 2.43      Resistance Training   Training Prescription Yes    Weight 4 lb    Reps 10-15      Interval Training   Interval Training No      Treadmill    MPH 2    Grade 1    Minutes 15    METs 2.81      Recumbant Bike   Level 2    Watts 26    Minutes 15    METs 3.12      NuStep   Level 5    Minutes 15    METs 2.1      T5 Nustep   Level 3    Minutes 15    METs 2      Home Exercise Plan   Plans to continue exercise at [employer] (comment)    Frequency Add 2 additional days to program exercise sessions.    Initial Home Exercises Provided 11/16/19           Nutrition:  Target Goals: Understanding of nutrition guidelines, daily intake of sodium '1500mg'$ , cholesterol '200mg'$ , calories 30% from fat and 7% or less from saturated fats, daily to have 5 or more servings of fruits and vegetables.  Education: Controlling Sodium/Reading Food Labels -Group verbal and written material supporting the discussion of sodium use in heart healthy nutrition. Review and explanation with models, verbal and written materials for utilization of the food label.   Education: General Nutrition Guidelines/Fats and Fiber: -Group instruction provided by verbal, written material, models and posters to present the general guidelines for heart healthy nutrition. Gives an explanation and review of dietary fats and fiber.   Biometrics:  Pre Biometrics - 10/18/19 1318      Pre Biometrics   Height '5\' 6"'$  (1.676 m)    Weight 201 lb 14.4 oz (91.6 kg)    BMI (Calculated) 32.6           Post Biometrics - 02/15/20 0735       Post  Biometrics   Height '5\' 6"'$  (1.676 m)    Weight 211 lb 6.4 oz (95.9 kg)    BMI (Calculated) 34.14    Single Leg Stand 3.32 seconds           Nutrition Therapy Plan and Nutrition Goals:  Nutrition Therapy & Goals - 11/20/19 0701      Nutrition Therapy   Diet HH, Low Na    Protein (specify units) 75-80g    Fiber 30 grams    Whole Grain Foods 3 servings    Saturated Fats 12 max. grams    Fruits and Vegetables 5 servings/day    Sodium 1.5 grams      Personal Nutrition Goals   Nutrition Goal ST: continue  current diet LT: enhance longevity    Comments awake 330-430am B: coffee, toast or english muffin with butter, jelly, or peanut butter B2: coffee (sometimes) Brian Chapman S: sandwich ([place] on wrap) or some peanut butter crackers D: wife cooks or  leftovers. Meat, vegetables. Pt reports weight stable for 30 years. Pt reports for snacks will also have trail mix and fruit. Pt reports having a variety of vegetables and using HH methods of cooking. Suggested whole grains for breads and wraps. Discussed HH eating. Pt reports eating intuitively.      Intervention Plan   Intervention Prescribe, educate and counsel regarding individualized specific dietary modifications aiming towards targeted core components such as weight, hypertension, lipid management, diabetes, heart failure and other comorbidities.;Nutrition handout(s) given to patient.    Expected Outcomes Short Term Goal: Understand basic principles of dietary content, such as calories, fat, sodium, cholesterol and nutrients.;Short Term Goal: A plan has been developed with personal nutrition goals set during dietitian appointment.;Long Term Goal: Adherence to prescribed nutrition plan.           Nutrition Assessments:  Nutrition Assessments - 10/19/19 1124      MEDFICTS Scores   Pre Score 99           MEDIFICTS Score Key:          ?70 Need to make dietary changes          40-70 Heart Healthy Diet         ? 40 Therapeutic Level Cholesterol Diet  Nutrition Goals Re-Evaluation:  Nutrition Goals Re-Evaluation    Row Name 12/19/19 0815 01/30/20 7824           Goals   Nutrition Goal ST: continue current diet LT: enhance longevity Brian Chapman continues to eat a balanced diet and avoids junk food.      Comment Continue with current changes --      Expected Outcome ST: continue current diet LT: enhance longevity Short: continue heart healthy eating Long: maintain heart health             Nutrition Goals Discharge (Final Nutrition Goals  Re-Evaluation):  Nutrition Goals Re-Evaluation - 01/30/20 0812      Goals   Nutrition Goal Brian Chapman continues to eat a balanced diet and avoids junk food.    Expected Outcome Short: continue heart healthy eating Long: maintain heart health           Psychosocial: Target Goals: Acknowledge presence or absence of significant depression and/or stress, maximize coping skills, provide positive support system. Participant is able to verbalize types and ability to use techniques and skills needed for reducing stress and depression.   Education: Depression - Provides group verbal and written instruction on the correlation between heart/lung disease and depressed mood, treatment options, and the stigmas associated with seeking treatment.   Education: Sleep Hygiene -Provides group verbal and written instruction about how sleep can affect your health.  Define sleep hygiene, discuss sleep cycles and impact of sleep habits. Review good sleep hygiene tips.     Education: Stress and Anxiety: - Provides group verbal and written instruction about the health risks of elevated stress and causes of high stress.  Discuss the correlation between heart/lung disease and anxiety and treatment options. Review healthy ways to manage with stress and anxiety.    Initial Review & Psychosocial Screening:  Initial Psych Review & Screening - 10/17/19 1311      Initial Review   Current issues with Current Sleep Concerns      Family Dynamics   Good Support System? Yes    Comments His incision is still tender which is making it hard to sleep. He can look to his wife for support.      Barriers  Psychosocial barriers to participate in program There are no identifiable barriers or psychosocial needs.      Screening Interventions   Interventions Provide feedback about the scores to participant;Encouraged to exercise;Program counselor consult;To provide support and resources with identified psychosocial needs     Expected Outcomes Short Term goal: Utilizing psychosocial counselor, staff and physician to assist with identification of specific Stressors or current issues interfering with healing process. Setting desired goal for each stressor or current issue identified.;Long Term Goal: Stressors or current issues are controlled or eliminated.;Short Term goal: Identification and review with participant of any Quality of Life or Depression concerns found by scoring the questionnaire.;Long Term goal: The participant improves quality of Life and PHQ9 Scores as seen by post scores and/or verbalization of changes           Quality of Life Scores:   Quality of Life - 10/18/19 1320      Quality of Life   Select Quality of Life      Quality of Life Scores   Health/Function Pre 28.8 %    Socioeconomic Pre 29.14 %    Psych/Spiritual Post 30 %    Family Pre 30 %          Scores of 19 and below usually indicate a poorer quality of life in these areas.  A difference of  2-3 points is a clinically meaningful difference.  A difference of 2-3 points in the total score of the Quality of Life Index has been associated with significant improvement in overall quality of life, self-image, physical symptoms, and general health in studies assessing change in quality of life.  PHQ-9: Recent Review Flowsheet Data    Depression screen [facility] 2/9 11/28/2019 10/18/2019   Decreased Interest 0 1   Down, Depressed, Hopeless 0 1   PHQ - 2 Score 0 2   Altered sleeping 1 1   Tired, decreased energy 1 1   Change in appetite 0 1   Feeling bad or failure about yourself  0 0   Trouble concentrating 0 1   Moving slowly or fidgety/restless 0 1   Suicidal thoughts 0 0   PHQ-9 Score 2 7   Difficult doing work/chores Not difficult at all  Somewhat difficult     Interpretation of Total Score  Total Score Depression Severity:  1-4 = Minimal depression, 5-9 = Mild depression, 10-14 = Moderate depression, 15-19 = Moderately severe  depression, 20-27 = Severe depression   Psychosocial Evaluation and Intervention:  Psychosocial Evaluation - 02/15/20 0737      Discharge Psychosocial Assessment & Intervention   Comments Brian Chapman will be graduating next week.  He has done well in rehab. He is planning to join [employer].  He feels a lot better and more steady on his feet now.  He is enjoying life and happy to be alive and functional.           Psychosocial Re-Evaluation:  Psychosocial Re-Evaluation    Row Name 11/23/19 [phone] 12/12/19 0812 01/09/20 0751 01/30/20 0811 02/01/20 0742     Psychosocial Re-Evaluation   Current issues with Current Sleep Concerns;Current Stress Concerns Current Sleep Concerns;Current Stress Concerns -- Current Sleep Concerns;Current Stress Concerns Current Sleep Concerns   Comments His incision is feeling better so he has been sleeping better.  He feels everything is moving in the right direction. Brian Chapman is sleeping well and feeels fine.  He got second Covid vaccine yesterday. No changes in stress or sleep No changes in stress  or sleep - he sleeps 3-5 hours a night and generally feels rested. Brian Chapman is doing well and feeling good overall.  Sleep is still an issue.  Continue to work on sleep.   Expected Outcomes Short: continue good sleep patterns Long: maintain positive outlook Short:  continue good sleep patterns Long: maintain positive outlook Short: maintain good sleep patterns Long: stay positive Short: maintain good sleep patterns Long: stay positive Short: maintain good sleep patterns Long: stay positive   Interventions -- -- -- Encouraged to attend Cardiac Rehabilitation for the exercise Encouraged to attend Cardiac Rehabilitation for the exercise          Psychosocial Discharge (Final Psychosocial Re-Evaluation):  Psychosocial Re-Evaluation - 02/01/20 0742      Psychosocial Re-Evaluation   Current issues with Current Sleep Concerns    Comments Brian Chapman is doing well and feeling good  overall.  Sleep is still an issue.  Continue to work on sleep.    Expected Outcomes Short: maintain good sleep patterns Long: stay positive    Interventions Encouraged to attend Cardiac Rehabilitation for the exercise           Vocational Rehabilitation: Provide vocational rehab assistance to qualifying candidates.   Vocational Rehab Evaluation & Intervention:   Education: Education Goals: Education classes will be provided on a variety of topics geared toward better understanding of heart health and risk factor modification. Participant will state understanding/return demonstration of topics presented as noted by education test scores.  Learning Barriers/Preferences:  Learning Barriers/Preferences - 10/17/19 1313      Learning Barriers/Preferences   Learning Barriers None    Learning Preferences None           General Cardiac Education Topics:  AED/CPR: - Group verbal and written instruction with the use of models to demonstrate the basic use of the AED with the basic ABC's of resuscitation.   Anatomy & Physiology of the Heart: - Group verbal and written instruction and models provide basic cardiac anatomy and physiology, with the coronary electrical and arterial systems. Review of Valvular disease and Heart Failure   Cardiac Procedures: - Group verbal and written instruction to review commonly prescribed medications for heart disease. Reviews the medication, class of the drug, and side effects. Includes the steps to properly store meds and maintain the prescription regimen. (beta blockers and nitrates)   Cardiac Medications I: - Group verbal and written instruction to review commonly prescribed medications for heart disease. Reviews the medication, class of the drug, and side effects. Includes the steps to properly store meds and maintain the prescription regimen.   Cardiac Medications II: -Group verbal and written instruction to review commonly prescribed medications  for heart disease. Reviews the medication, class of the drug, and side effects. (all other drug classes)    Go Sex-Intimacy & Heart Disease, Get SMART - Goal Setting: - Group verbal and written instruction through game format to discuss heart disease and the return to sexual intimacy. Provides group verbal and written material to discuss and apply goal setting through the application of the S.M.A.R.T. Method.   Other Matters of the Heart: - Provides group verbal, written materials and models to describe Stable Angina and Peripheral Artery. Includes description of the disease process and treatment options available to the cardiac patient.   Infection Prevention: - Provides verbal and written material to individual with discussion of infection control including proper hand washing and proper equipment cleaning during exercise session.   Cardiac Rehab from 02/22/2020 in [facility] Cardiac and  Pulmonary Rehab  Date 10/18/19  Educator AS  Instruction Review Code 1- Verbalizes Understanding      Falls Prevention: - Provides verbal and written material to individual with discussion of falls prevention and safety.   Cardiac Rehab from 02/22/2020 in [facility] Cardiac and Pulmonary Rehab  Date 10/18/19  Educator AS  Instruction Review Code 1- Verbalizes Understanding      Other: -Provides group and verbal instruction on various topics (see comments)   Knowledge Questionnaire Score:  Knowledge Questionnaire Score - 10/18/19 1320      Knowledge Questionnaire Score   Pre Score 24/26           Core Components/Risk Factors/Patient Goals at Admission:  Personal Goals and Risk Factors at Admission - 10/18/19 1319      Core Components/Risk Factors/Patient Goals on Admission    Weight Management Yes;Weight Maintenance;Weight Loss    Admit Weight 201 lb 14.4 oz (91.6 kg)    Goal Weight: Short Term 190 lb (86.2 kg)    Goal Weight: Long Term 180 lb (81.6 kg)    Expected Outcomes Short Term: Continue  to assess and modify interventions until short term weight is achieved;Long Term: Adherence to nutrition and physical activity/exercise program aimed toward attainment of established weight goal;Weight Loss: Understanding of general recommendations for a balanced deficit meal plan, which promotes 1-2 lb weight loss per week and includes a negative energy balance of [phone] kcal/d;Understanding recommendations for meals to include 15-35% energy as protein, 25-35% energy from fat, 35-60% energy from carbohydrates, less than '200mg'$  of dietary cholesterol, 20-35 gm of total fiber daily;Understanding of distribution of calorie intake throughout the day with the consumption of 4-5 meals/snacks    Diabetes Yes    Intervention Provide education about signs/symptoms and action to take for hypo/hyperglycemia.;Provide education about proper nutrition, including hydration, and aerobic/resistive exercise prescription along with prescribed medications to achieve blood glucose in normal ranges: Fasting glucose 65-99 mg/dL    Expected Outcomes Short Term: Participant verbalizes understanding of the signs/symptoms and immediate care of hyper/hypoglycemia, proper foot care and importance of medication, aerobic/resistive exercise and nutrition plan for blood glucose control.;Long Term: Attainment of HbA1C < 7%.           Education:Diabetes - Individual verbal and written instruction to review signs/symptoms of diabetes, desired ranges of glucose level fasting, after meals and with exercise. Acknowledge that pre and post exercise glucose checks will be done for 3 sessions at entry of program.   Cardiac Rehab from 02/22/2020 in [facility] Cardiac and Pulmonary Rehab  Date 10/18/19  Educator AS  Instruction Review Code 1- Verbalizes Understanding      Education: Know Your Numbers and Risk Factors: -Group verbal and written instruction about important numbers in your health.  Discussion of what are risk factors and how they  play a role in the disease process.  Review of Cholesterol, Blood Pressure, Diabetes, and BMI and the role they play in your overall health.   Core Components/Risk Factors/Patient Goals Review:   Goals and Risk Factor Review    Row Name 11/23/19 0748 12/12/19 0809 01/09/20 0745 01/30/20 0809 02/01/20 0743     Core Components/Risk Factors/Patient Goals Review   Personal Goals Review Weight Management/Obesity;Hypertension;Lipids;Diabetes Weight Management/Obesity;Hypertension;Lipids;Diabetes Weight Management/Obesity;Hypertension;Lipids;Diabetes Weight Management/Obesity;Hypertension;Lipids;Diabetes Weight Management/Obesity;Hypertension;Lipids;Diabetes   Review Brian Chapman is taking all meds as directed.  He takes his BP a couple times per week.  He doesn't regularly check BG at home.  He feels more limbered up by coming to exercise.  He  likes the routine of the program. Brian Chapman says his weight fluctuates on our scale.  He is taking meds as directed.  He walks a lot at work but hasnt exercised outside program sessions.  He checks BP every night and it has been fine. Brian Chapman continues to check BP at home.  He is taking all meds as directed.  He hasnt been able to exercise outside HT sessions.  We reviewed THR range/RPE. Brian Chapman is taking meds as directed and monitroing BP at home.  He plans to exercise at [employer] when he completes HT. Brian Chapman feels better than he ever has and in the habit of checking things.  He said if he didn't have the scars he wouldn't know that anything had happened.   Expected Outcomes Short : check with Dr about how often to monitor BG Long:  manage risk factors Short: continue to monitor risk factos Long: keep BP/BG in acceptable range Short:  continue to monitor risk factors Long:  maintain heart health long term Short: continue to monitor risk factors Long: maintain heart health Short: continue to monitor risk factors Long: maintain heart health          Core Components/Risk  Factors/Patient Goals at Discharge (Final Review):   Goals and Risk Factor Review - 02/01/20 0743      Core Components/Risk Factors/Patient Goals Review   Personal Goals Review Weight Management/Obesity;Hypertension;Lipids;Diabetes    Review Brian Chapman feels better than he ever has and in the habit of checking things.  He said if he didn't have the scars he wouldn't know that anything had happened.    Expected Outcomes Short: continue to monitor risk factors Long: maintain heart health           ITP Comments:  ITP Comments    Row Name 10/17/19 1310 10/24/19 0831 11/01/19 0608 11/20/19 0717 11/21/19 0757   ITP Comments Virtual Orientation performed. Patient informed when to come in for RD and EP orientation. Diagnosis can be found in [facility] 09/17/2019 First full day of exercise!  Patient was oriented to gym and equipment including functions, settings, policies, and procedures.  Patient's individual exercise prescription and treatment plan were reviewed.  All starting workloads were established based on the results of the 6 minute walk test done at initial orientation visit.  The plan for exercise progression was also introduced and progression will be customized based on patient's performance and goals. 30 day chart review completed. ITP sent to Dr Zachery Dakins Medical Director, for review,changes as needed and signature. Continue with ITP if no changes requested Completed Initial RD Alberteen Spindle arrived to tell staff he was feeling very fatigued. He received his COVID Vaccine yesterday.  Sent him home to rest, no session today   [place] Name 11/29/19 0540 12/25/19 1621 12/27/19 0544 01/24/20 0523 02/21/20 0622   ITP Comments 30 Day review completed. Medical Director review done, changes made as directed,and approval shown by signature of Market researcher. Brian Chapman is out for week trout fishing in [place]. 30 Day review completed. ITP review done, changes made as directed,and approval shown by signature of   Scientist, research (life sciences). 30 Day review completed. Medical Director ITP review done, changes made as directed, and signed approval by Medical Director. 30 Day review completed. Medical Director ITP review done, changes made as directed, and signed approval by Medical Director.         Comments: Discharge ITP

## 2020-03-04 DIAGNOSIS — Z951 Presence of aortocoronary bypass graft: Secondary | ICD-10-CM | POA: Diagnosis not present

## 2020-03-04 DIAGNOSIS — I2510 Atherosclerotic heart disease of native coronary artery without angina pectoris: Secondary | ICD-10-CM | POA: Diagnosis not present

## 2020-03-04 DIAGNOSIS — Z955 Presence of coronary angioplasty implant and graft: Secondary | ICD-10-CM | POA: Diagnosis not present

## 2020-03-26 DIAGNOSIS — G4733 Obstructive sleep apnea (adult) (pediatric): Secondary | ICD-10-CM | POA: Diagnosis not present

## 2020-04-11 ENCOUNTER — Ambulatory Visit (INDEPENDENT_AMBULATORY_CARE_PROVIDER_SITE_OTHER): Payer: BC Managed Care – PPO | Admitting: Physician Assistant

## 2020-04-11 ENCOUNTER — Ambulatory Visit: Payer: BC Managed Care – PPO | Admitting: Cardiovascular Disease

## 2020-04-11 ENCOUNTER — Other Ambulatory Visit: Payer: Self-pay

## 2020-04-11 ENCOUNTER — Encounter: Payer: Self-pay | Admitting: Physician Assistant

## 2020-04-11 VITALS — BP 108/60 | HR 61 | Ht 66.0 in | Wt 211.0 lb

## 2020-04-11 DIAGNOSIS — I10 Essential (primary) hypertension: Secondary | ICD-10-CM

## 2020-04-11 DIAGNOSIS — I2510 Atherosclerotic heart disease of native coronary artery without angina pectoris: Secondary | ICD-10-CM

## 2020-04-11 DIAGNOSIS — I252 Old myocardial infarction: Secondary | ICD-10-CM

## 2020-04-11 DIAGNOSIS — Z951 Presence of aortocoronary bypass graft: Secondary | ICD-10-CM

## 2020-04-11 DIAGNOSIS — I5032 Chronic diastolic (congestive) heart failure: Secondary | ICD-10-CM

## 2020-04-11 DIAGNOSIS — Z79899 Other long term (current) drug therapy: Secondary | ICD-10-CM | POA: Diagnosis not present

## 2020-04-11 DIAGNOSIS — G4730 Sleep apnea, unspecified: Secondary | ICD-10-CM

## 2020-04-11 DIAGNOSIS — E119 Type 2 diabetes mellitus without complications: Secondary | ICD-10-CM

## 2020-04-11 DIAGNOSIS — K219 Gastro-esophageal reflux disease without esophagitis: Secondary | ICD-10-CM

## 2020-04-11 DIAGNOSIS — Z87891 Personal history of nicotine dependence: Secondary | ICD-10-CM

## 2020-04-11 DIAGNOSIS — E785 Hyperlipidemia, unspecified: Secondary | ICD-10-CM

## 2020-04-11 NOTE — Progress Notes (Signed)
Office Visit    Patient Name: Brian Chapman Date of Encounter: 04/11/2020  Primary Care Provider:  [facility] Primary Cardiologist:  Lorine Bears, MD  Chief Complaint    Chief Complaint  Patient presents with  . Follow-up    3 Month follow up. Medications verbally reviewed with patient.     74 year old male with history of history of CAD,STEMI 09/16/19, CABG x4 09/17/19(LIMA-LAD, SVG-diagonal, SVG-OM1 to OM2),possible post CABGAF,DM2, hypertension, hyperlipidemia,former smoker,OSA on CPAP, obesity, and seen today for 3 month follow-up s/p CABG.   Past Medical History    Past Medical History:  Diagnosis Date  . CAD (coronary artery disease)    severe distal left main stenosis with plaque rupture, extending into the ostial LAD and occluded distal LAD, likely from embolization.  He had mild to moderate pRCA stenosis. He underwent POBA of distal LAD  . Diabetes mellitus without complication (HCC)   . HFrEF (heart failure with reduced ejection fraction) (HCC)    2/6 TTE 45-50%, severe LVH, G1DD  . History of tobacco use   . Hyperlipidemia   . Hypertension   . OSA on CPAP    wears CPAP qHS per pt  . PAC (premature atrial contraction)    PAC verus post cabg AF on amiodarone s/p cath. Not on OAC.  . S/P CABG x 4    2/7 LIMA-LAD, SVG-diagonal, SVG-OM1 to OM2   Past Surgical History:  Procedure Laterality Date  . CARDIAC CATHETERIZATION    . CORONARY ARTERY BYPASS GRAFT N/A 09/17/2019   Procedure: CORONARY ARTERY BYPASS GRAFTING (CABG), ON PUMP, TIMES FOUR, USING LEFT INTERNAL MAMMARY ARTERY AND RIGHT GREATER SAPHENOUS VEIN HARVESTED ENDOSCOPICALLY;  Surgeon: Linden Dolin, MD;  Location: [facility] OR;  Service: Open Heart Surgery;  Laterality: N/A;  . CORONARY/GRAFT ACUTE MI REVASCULARIZATION N/A 09/16/2019   Procedure: Coronary/Graft Acute MI Revascularization;  Surgeon: Iran Ouch, MD;  Location: [facility] INVASIVE CV LAB;  Service: Cardiovascular;   Laterality: N/A;  . LEFT HEART CATH AND CORONARY ANGIOGRAPHY N/A 09/16/2019   Procedure: LEFT HEART CATH AND CORONARY ANGIOGRAPHY;  Surgeon: Iran Ouch, MD;  Location: [facility] INVASIVE CV LAB;  Service: Cardiovascular;  Laterality: N/A;  . TEE WITHOUT CARDIOVERSION N/A 09/17/2019   Procedure: TRANSESOPHAGEAL ECHOCARDIOGRAM (TEE);  Surgeon: Linden Dolin, MD;  Location: [facility] OR;  Service: Open Heart Surgery;  Laterality: N/A;  . TONSILLECTOMY    . VASECTOMY      Allergies  Allergies  Allergen Reactions  . Lisinopril Cough  . Angiotensin Receptor Blockers     cough    History of Present Illness    Brian Chapman is a 74 y.o. male with PMH as above. He mostly follows at the [place]. He has history of previous cardiac catheterization and mild CAD before his recent CABG; however, details unknown and records not available per EMR. He works as a Production designer, theatre/television/film at [employer].Of note, he had COVID-19 06/2018 for ~10 days but did not require hospitalization and no residual symptoms.   On 09/16/19, he was on his way to work when he started to have severe substernal chest tightness and heaviness with associated shortness of breath. He wasfound to have severe distal left main stenosis with plaque rupture, extending into the ostial LAD and occluded distal LAD, likely from embolization. He had mild to moderate pRCAs.He underwentPOBAof distal LAD.TTE showed EF 45 to 50%.He underwent CABGx4 as copied and pasted below 09/17/19(LIMA-LAD, SVG-diagonal, SVG-OM1 to OM2). Right sided EVH was  from the right thigh and lower leg. Operative LVSF nl and with diffuse disease consistent with underlying DM2. He was continued on ASA, atorvastatin, metoprolol. AF was noted on telemetry during the admission, though at discharge, no clear AFwas notedand insteadnoted werePACs.  At follow-up on 2/22 with CTS/ Dr. [name], it was noted that he was holding some of his BP medications after discharge with elevated BP  in clinic. He felt weak with poor appetite. BP 157/90, HR 94.  He was seen at [facility] and fatigued as well.  He was closely monitoring his blood sugar and BP with SBP 102-132 and DBP 68-87.  He reported ongoing insomnia and right sided neckwhere he received hiscentralline.He was scheduled for cardiac rehab. BP sub-optimalwith patient reporting it well controlled at home.He noted melenawith CBC unremarkable. He had diarrhea with BMET unremarkable.  He had not yet returned to work and was concerned regarding his ongoing fatigue, as he used to be able to walk for long distances.  He was seen 3/22 and had started cardiac rehab and doing well.  He had started back at work with a reduced shift /half day work schedule of 6 hours per day and tolerating this well.  He noted increasing energy and strength. His sternal incision continued to heal well with only mild soreness. He reported improved right-sided neck pain and healing scab.  He was careful while shaving. His previously reported insomnia, dizziness with position changes, melena, and diarrhea had resolved. No further DOE with minimal activity. ARB was deferred given his allergy to ACE.  He was seen again 4/22 and reported he was now working a full schedule at [employer] with fatigue noted toward the end of the day and resolving with sleep. He did note that his job was stressful lately. He was not sleeping more than 4-5 hours nightly, which was his baseline.  CPAP compliance confirmed. His sternal incision was still somewhat TTP but no further R sided neck pain. He was still working with cardiac rehab and reported that he was doing well and glad to have the regimen. He did not have a scale for weights at home but clinic weights were noted to have increased.  BP was elevated, which he attributed to the walk in, given his home BP was lower. He had significantly reduced his intake of salt. He reported 1 cup of coffee in the AM. Due to increased weight  and BP, he was started on low dose chlorthalidone. After confirming with CTS, he was continued on Plavix with amiodarone discontinued.   5/10 echo was performed and showed EF improved from 45-50% to 60-65%, mild LVH, and mild LAE. Cardiac rehab BP 5/17 was 142/78 and 5/19 was 112/60 with resting HR 60bpm.  When seen in clinic 01/01/20, he presented to clinic and was doing well from a cardiac standpoint. He had tolerated the initiation of chlorthalidone well. He was still attending cardiac rehab and would momentarily feel woozy (less than 1-2 seconds) when getting of the treadmill after a long workout. He denied chest pain, palpitations, dyspnea, pnd, orthopnea, n, v, dizziness, syncope, edema, weight gain, or early satiety. He reported working long hours for his job (8-10 hours) and still tired towards the end of the day. He had recently take a vacation, however. He reported home SBP 125-145 and BP 50-80 with HR 55-65bpm. His incisions were healing well. He denied any s/sx of bleeding.   Today, he RTC and is doing well overall. He notes ongoing issues with fatigue,  as he tires towards the end of the day.  He continues to note a full work schedule with the [employer].  He reports the average workday is 7 hours, which usually means that he is working 9 to 10 hours.  He continues to sleep approximately 3 /4 to 5 hours per night.  He is monitoring his BP at home but forgot to bring in his log today, reporting SBP usually in the low 100s.  He denies any presyncope/dizziness/lightheadedness with these lower pressures.  BP today 108/60 with HR 61 bpm.  [facility] and was restarted on valsartan, despite his ACE cough, as his BP was reportedly still elevated and as this is GDMT.  He did receive a follow-up BMET; however, this was at the [place], and he does not have his [place] records with him.  He has noticed an increase in his baseline acid reflux.  He states that he usually feels reflux towards the evening;  however, over the last few months, he has felt his reflux occasionally in the morning.  This reflux is not always associated with food.  He will take an antiacid when the reflux gets bothersome enough, which she reports alleviates the reflux some.  Diet reviewed with patient reporting occasionally eating spicy foods, such as a pizza with hot peppers.  He drinks approximately 2-3 sodas per week and 1 cup of coffee daily.  No alcohol.  He notes the occasional chocolate.  He denies any chest pain, reporting that any discomfort he feels is reflux.  No reported racing heart rate or palpitations.  No lower extremity edema, abdominal distention, dyspnea, shortness of breath, or early satiety.  He does note some frustration that he continues to have issues with fatigue and wonders if it is due to the stress of his job, as well as his decreased activity level as he is always at work.  He also wonders the extent to which the aging process is contributing.  No signs or symptoms of bleeding.  He reports medication compliance. He hopes to soon have another break for a vacation. He is staying safe during COVID-19.    Home Medications    Prior to Admission medications   Medication Sig Start Date End Date Taking? Authorizing Provider  allopurinol (ZYLOPRIM) 300 MG tablet Take 300 mg by mouth daily.    [provider]  amiodarone (PACERONE) 200 MG tablet Take 1 tablet (200 mg total) by mouth 2 (two) times daily. 09/21/19   Leary Roca, PA-C  aspirin EC 81 MG tablet Take 81 mg by mouth daily.    [provider]  atorvastatin (LIPITOR) 80 MG tablet Take 80 mg by mouth daily.    [provider]  betamethasone valerate lotion (VALISONE) 0.1 % Apply 1 application topically daily as needed for irritation.    [provider]  clopidogrel (PLAVIX) 75 MG tablet Take 1 tablet (75 mg total) by mouth daily. 09/22/19   Leary Roca, PA-C  colchicine 0.6 MG tablet Take 1 tablet (0.6  mg total) by mouth daily. 09/22/19   Leary Roca, PA-C  cyclobenzaprine (FLEXERIL) 5 MG tablet Take 1 tablet (5 mg total) by mouth 3 (three) times daily as needed for muscle spasms. 06/30/19   Joni Reining, PA-C  desonide (DESOWEN) 0.05 % cream Apply 1 application topically 2 (two) times daily.    [provider]  diclofenac Sodium (VOLTAREN) 1 % GEL Apply 4 g topically 4 (four) times  daily.    [provider]  glucose blood test strip Test blood ac and hs...the patient has been instructed. 10/02/19   Linden Dolin, MD  indomethacin (INDOCIN) 50 MG capsule Take 50 mg by mouth daily as needed. Gout pain    [provider]  ketoconazole (NIZORAL) 2 % shampoo Apply 1 application topically 2 (two) times a week.    [provider]  Lancets [facility] ULTRASOFT) lancets Use as instructed...test ac and hs. 10/02/19   Linden Dolin, MD  metFORMIN (GLUCOPHAGE) 500 MG tablet Take 500 mg by mouth daily with breakfast.    [provider]  metoprolol tartrate (LOPRESSOR) 25 MG tablet Take 12.5 mg by mouth 2 (two) times daily.    [provider]  nitroGLYCERIN (NITROSTAT) 0.4 MG SL tablet Place 1 tablet (0.4 mg total) under the tongue every 5 (five) minutes as needed for chest pain. 10/13/19 01/11/20  Marisue Ivan D, PA-C    Review of Systems    He reports fatigue towards the end of the day and an increase in reflux.  He denies chest pain, palpitations, dyspnea, pnd, orthopnea, n, v, dizziness, syncope, edema, weight gain, or early satiety.  All other systems reviewed and are otherwise negative except as noted above.  Physical Exam    VS:  BP 108/60 (BP Location: Left Arm, Patient Position: Sitting, Cuff Size: Normal)   Pulse 61   Ht 5\' 6"  (1.676 m)   Wt 211 lb (95.7 kg)   SpO2 97%   BMI 34.06 kg/m  , BMI Body mass index is 34.06 kg/m. GEN: Well nourished, well developed, in no acute distress. HEENT: normal. Neck: Supple, no JVD,  carotid bruits, or masses. Cardiac:RRR, no murmurs, rubs, or gallops. R sided neck central line incision healed. Central sternal incision now healed. R EVH incision sites healed. No clubbing, cyanosis. no LEE.  Radials/DP/PT 2+ and equal bilaterally.  Respiratory:  Respirations regular and unlabored, clear to auscultation bilaterally. GI: Soft, nontender, nondistended, BS + x 4. MS: no deformity or atrophy. Skin: warm and dry, no rash. Neuro:  Strength and sensation are intact. Psych: Normal affect.  Accessory Clinical Findings    ECG personally reviewed by me today -sinus rhythm with first-degree block, 61bpm, previous inferior infarct and lateral ischemia as noted on prior tracings, TWI in V1/V2- no acute changes.  VITALS Reviewed today   Temp Readings from Last 3 Encounters:  10/16/19 (!) 97.3 F (36.3 C) (Temporal)  10/02/19 (!) 97.5 F (36.4 C) (Temporal)  09/21/19 98.5 F (36.9 C) (Oral)   BP Readings from Last 3 Encounters:  04/11/20 108/60  01/01/20 134/90  11/29/19 (!) 142/80   Pulse Readings from Last 3 Encounters:  04/11/20 61  01/01/20 (!) 59  11/29/19 (!) 52    Wt Readings from Last 3 Encounters:  04/11/20 211 lb (95.7 kg)  02/15/20 211 lb 6.4 oz (95.9 kg)  01/01/20 209 lb 8 oz (95 kg)     LABS  reviewed today   CareEverwhere Labs present and most recent? Yes/No: No  Lab Results  Component Value Date   WBC 7.6 10/13/2019   HGB 13.7 10/13/2019   HCT 42.9 10/13/2019   MCV 87 10/13/2019   PLT 337 10/13/2019   Lab Results  Component Value Date   CREATININE 1.15 12/11/2019   BUN 15 12/11/2019   NA 137 12/11/2019   K 4.1 12/11/2019   CL 106 12/11/2019   CO2 20 (L) 12/11/2019   Lab Results  Component Value Date   ALT 18 11/29/2019   AST 21 11/29/2019   ALKPHOS 95 11/29/2019   BILITOT 0.3 11/29/2019   Lab Results  Component Value Date   CHOL 94 (L) 11/29/2019   HDL 34 (L) 11/29/2019   LDLCALC 40 11/29/2019   TRIG 109 11/29/2019   CHOLHDL  2.8 11/29/2019    Lab Results  Component Value Date   HGBA1C 6.6 (H) 09/21/2019   No results found for: TSH   STUDIES/PROCEDURES reviewed today   Echo 12/18/19 1. Left ventricular ejection fraction, by estimation, is 60 to 65%. The  left ventricle has normal function. The left ventricle has no regional  wall motion abnormalities. There is mild left ventricular hypertrophy.  Left ventricular diastolic parameters  were normal.  2. Right ventricular systolic function is normal. The right ventricular  size is normal. Tricuspid regurgitation signal is inadequate for assessing  PA pressure.  3. Left atrial size was mildly dilated.  4. The mitral valve is normal in structure. No evidence of mitral valve  regurgitation. No evidence of mitral stenosis.  5. The aortic valve is normal in structure. Aortic valve regurgitation is  not visualized. Mild to moderate aortic valve sclerosis/calcification is  present, without any evidence of aortic stenosis.  6. The inferior vena cava is normal in size with greater than 50%  respiratory variability, suggesting right atrial pressure of 3 mmHg.  Comparison(s): LV EF 45-50%, severe AK LV, apical and inferoapical and  distal anteroseptal/anteroapical walls.   09/17/19 CABGx4  Coronary Artery Bypass Grafting x4 Left Internal Mammary Artery to Distal Left Anterior Descending Coronary Artery;Saphenous Vein Graft torightPosterolateralCoronary Artery and 1stObtuse Marginal Branch of Left Circumflex Coronary Arteryas a sequenced graft;Sapheonous Vein Graft to1stDiagonal Branch Coronary Artery;Endoscopic Vein Harvest fromrightThigh and Lower Leg;  Rigid sternal reconstruction with linear plating system Operative Findings: ? preservedleft ventricular systolic function ? goodquality left internal mammary artery conduit ? Goodquality saphenous vein conduit ? fairquality target vessels for grafting due to diffuse disease  consistent with underlying diabetes  The1stdiagonal branch of the left anterior descending coronary artery was grafted using a reversed saphenous vein graft in an end-to-side fashion. At the site of distal anastomosis the target vessel was fairquality and measured approximately 1.35mm in diameter.  Therightposterolateralbranch of the right coronary artery was grafted using a reversed saphenous vein graft in an end-to-side fashion. At the site of distal anastomosis the target vessel was fairquality and measured approximately 1.66mm in diameter. Next, the1stobtuse marginal branch of the left circumflex coronary artery was grafted in an side-to-side fashionto create a sequenced graft configuration with the Right PLA. At the site of distal anastomosis the target vessel was fairquality and measured approximately 1.32mm in diameter..  The distal left anterior coronary artery was grafted with the left internal mammary artery in an end-to-side fashion. At the site of distal anastomosis the target vessel was fairquality and measured approximately1.94mm in diameter. All proximal vein graft anastomoses were placed directly to the ascending aorta prior to removal of the aortic cross clamp. The septal myocardial temperature rose rapidly after reperfusion of the left internal mammary artery graft. Deairing procedures were performed and the aortic cross clamp was removed  2/7 Intraoperative TEE PRE-OP FINDINGS  Left Ventricle: The left ventricle has mild-moderately reduced systolic  function, with an ejection fraction of 40-45%.  Right Ventricle: The right ventricle has normal systolic function.  Left Atrium: The left atrial appendage is well visualized and there is  evidence of thrombus present. The left  atrial appendage is well visualized  and there is no evidence of thrombus present.  Right Atrium: Right atrial pressure is estimated at 10 mmHg.  Mitral Valve: The mitral valve is normal  in structure. Mitral valve  regurgitation is trivial by color flow Doppler.  Tricuspid Valve: The tricuspid valve was normal in structure. Tricuspid  valve regurgitation is trivial by color flow Doppler.  Dorris Singh MD  Electronically signed by Dorris Singh MD  Signature Date/Time: 09/21/2019  2/7 US Doppler pre CABG Carotid and [place] Summary:  Right Carotid: The extracranial vessels were near-normal with only minimal  wall thickening or plaque.  Left Carotid: The extracranial vessels were near-normal with only minimal  wall thickening or plaque.  Vertebrals: Bilateral vertebral arteries demonstrate antegrade flow.  Subclavians: Not assessed.  Right Upper Extremity: Doppler waveforms remain within normal limits with  right radial compression. Doppler waveforms decrease <50% with right ulnar  compression.  Left Upper Extremity: Doppler waveforms remain within normal limits with  left radial compression. Doppler waveforms remain within normal limits  with left ulnar compression.   Cath 09/16/19: 1. Severe distal left main stenosis with plaque rupture extending into the ostial LAD with occluded distal LAD likely due to embolization. Mild to moderate proximal RCA stenosis. Surgical targets include mid LAD, first diagonal and OM 3. 2. Normal LV systolic function and moderately elevated left ventricular end-diastolic pressure. 3. Successful balloon angioplasty to the distal LAD to establish TIMI-3 flow. Cardiac catheterization was performed via the right radial artery. 4. Successful intra-aortic balloon pump placement via the right common femoral artery. Recommendations: Given this the left main involvement, diffuse LAD disease and diabetic status, I think surgical revascularization with urgent/emergent CABG is indicated. The patient did not receive any oral antiplatelet medication other than aspirin. Continue unfractionated heparin. I discussed the case with Dr.  [name].  TTE 09/16/19: 1. Left ventricular ejection fraction, by visual estimation, is 45 to  50%. The left ventricle has hyperdynamic function. There is severely  increased left ventricular hypertrophy.  2. Definity contrast agent was given IV to delineate the left ventricular  endocardial borders.  3. The left ventricle demonstrates regional wall motion abnormalities.  4. Severe akinesis of the left ventricular, apical apical segment and  inferoapical and distal anteroseptal/anteroapical walls. No apical  thrombus.  5. Left ventricular diastolic parameters are consistent with Grade I  diastolic dysfunction (impaired relaxation).  6. Global right ventricle has mildly reduced systolic function.The right  ventricular size is normal. No increase in right ventricular wall  thickness.  7. Hypokinetic right ventricular apex.  8. Left atrial size was normal.  9. Right atrial size was normal.  10. Mild mitral annular calcification.  11. The mitral valve is grossly normal. Trivial mitral valve  regurgitation.  12. The tricuspid valve is grossly normal.  13. The tricuspid valve is grossly normal. Tricuspid valve regurgitation  is trivial.  14. The aortic valve is tricuspid. Aortic valve regurgitation is not  visualized. Mild aortic valve sclerosis without stenosis.  15. The pulmonic valve was grossly normal. Pulmonic valve regurgitation is  not visualized.  16. The interatrial septum was not well visualized.   Assessment & Plan   CAD(LIMA-LAD, SVG-diagonal, SVG-OM1 to OM2) S/p 2/6STEMI; S/p 2/7 CABG x4 --No CP but does report an increase in reflux.  Previously, he only experienced reflux at night.  He is now experiencing reflux in the morning as well.  He has been taking antiacids with some relief of the symptoms. He has  not needed his PRN SL nitro.  He continues to note fatigue towards the end of the day and wonders the extent to which deconditioning, stress, and age is  contributing.  EKG today shows return of TWI in V1/V2, which was discussed with primary cardiologist/interventionalist in conjunction with the symptoms and agreement that we will continue to monitor.  He did well with cardiac rehab and BP/heart rate today well controlled.  All incisions s/p CABG healed. --ContinueDAPT with ASA and Plavix.  Continue as needed SL nitro. Escalation of BB precluded by bradycardia. Valsartan 320 mg was added by [facility] with patient denying cough with this addition, given his previously reported ACE inhibitor cough.  He reports a follow-up BMET obtained by the [place]; however, he is agreeable to repeat BMET today, given we do not have these records.  Also discussed a repeat BMET will be helpful, given his slight drop in Ca on previous labs. Continue chlorthalidone with any changes to be made pending labs.  Continue Lipitor 80 mg daily and aggressive risk factor modification.  Echo, obtained after CABG and escalation of GDMT as above and with normalization of EF.  He will continue to work on lifestyle changes, including job stress and increase of activity as tolerated.   HFrEF(EF 45-50%) with subsequent normalization of EF (60 to 65%) / Chronic HFpEF --No SOBat rest. Euvolemic and well compensated. Repeat echo as above with normalization of EF. Per GDMT with comorbid HTN/DM2, would start on ACE/ARB; however, this has previously been deferred given ACE cough.  [facility] has started valsartan 320 mg.  We will obtain a BMET as above. Defer escalation of BB given bradycardia.  Future considerations include transition from chlorthalidone to Lasix pending repeat BMET today, and as we have been able to now add on losartan for BP control.  HTN --BP well controlled and  improved with chlorthalidone and valsartan. Goal BP 130/80. Due to bradycardia, defer escalation of BB. Future considerations could include transition from chlorthalidone to Lasix if BP / renal function allow. Will defer for now.    HLD --Continue statin. LDL well controlled at 40.   ?PAFversus PACs --NSR today. No racing HR or palpitations reported.   DM2 --Most recent A1C as above.Continue metformin.Monitoring per PCP.   OSA on CPAP --Compliant with CPAP.  Hypocalcemia --Previous BMET 12/2019 with calcium borderline low at 8.8.  Recheck BMET as above.  Recommendations regarding ongoing chlorthalidone pending repeat BMET.  Medication changes: None Labs ordered: BMET Studies / Imaging ordered: None Future considerations: Transition to lasix with BP improvement and pending BMET.  Update on MD / interventionalist feedback regarding his increased reflux in the setting of known disease.  Disposition: RTC 3 months.   Lennon Alstrom, PA-C 04/11/2020

## 2020-04-11 NOTE — Patient Instructions (Signed)
Medication Instructions:  Your physician recommends that you continue on your current medications as directed. Please refer to the Current Medication list given to you today.  *If you need a refill on your cardiac medications before your next appointment, please call your pharmacy*  Lab Work: Your physician recommends that you return for lab work in: TODAY - BMET.   If you have labs (blood work) drawn today and your tests are completely normal, you will receive your results only by: Marland Kitchen MyChart Message (if you have MyChart) OR . A paper copy in the mail If you have any lab test that is abnormal or we need to change your treatment, we will call you to review the results.  Testing/Procedures: none  Follow-Up: At [facility], you and your health needs are our priority.  As part of our continuing mission to provide you with exceptional heart care, we have created designated Provider Care Teams.  These Care Teams include your primary Cardiologist (physician) and Advanced Practice Providers (APPs -  Physician Assistants and Nurse Practitioners) who all work together to provide you with the care you need, when you need it.  We recommend signing up for the patient portal called "MyChart".  Sign up information is provided on this After Visit Summary.  MyChart is used to connect with patients for Virtual Visits (Telemedicine).  Patients are able to view lab/test results, encounter notes, upcoming appointments, etc.  Non-urgent messages can be sent to your provider as well.   To learn more about what you can do with MyChart, go to [email or web address].    Your next appointment:   3 month(s)  The format for your next appointment:   In Person  Provider:    You may see Lorine Bears, MD or one of the following Advanced Practice Providers on your designated Care Team:    Nicolasa Ducking, NP  Eula Listen, PA-C  Marisue Ivan, PA-C

## 2020-04-12 LAB — BASIC METABOLIC PANEL
BUN/Creatinine Ratio: 13 (ref 10–24)
BUN: 14 mg/dL (ref 8–27)
CO2: 20 mmol/L (ref 20–29)
Calcium: 9.4 mg/dL (ref 8.6–10.2)
Chloride: 100 mmol/L (ref 96–106)
Creatinine, Ser: 1.11 mg/dL (ref 0.76–1.27)
GFR calc Af Amer: 75 mL/min/{1.73_m2} (ref >59)
GFR calc non Af Amer: 65 mL/min/{1.73_m2} (ref >59)
Glucose: 115 mg/dL — ABNORMAL HIGH (ref 65–99)
Potassium: 4 mmol/L (ref 3.5–5.2)
Sodium: 139 mmol/L (ref 134–144)

## 2020-04-26 DIAGNOSIS — G4733 Obstructive sleep apnea (adult) (pediatric): Secondary | ICD-10-CM | POA: Diagnosis not present

## 2020-05-26 DIAGNOSIS — G4733 Obstructive sleep apnea (adult) (pediatric): Secondary | ICD-10-CM | POA: Diagnosis not present

## 2020-06-26 DIAGNOSIS — G4733 Obstructive sleep apnea (adult) (pediatric): Secondary | ICD-10-CM | POA: Diagnosis not present

## 2020-07-09 NOTE — Progress Notes (Signed)
Office Visit    Patient Name: Brian Chapman Date of Encounter: 07/15/2020  Primary Care Provider:  [facility] Primary Cardiologist:  Lorine Bears, MD  Chief Complaint    Chief Complaint  Patient presents with  . office visit    3 month F/U-No new cardiac concerns; Meds verbally reviewed with patient.    74 year old male with history of history of CAD,STEMI 09/16/19, CABG x4 09/17/19(LIMA-LAD, SVG-diagonal, SVG-OM1 to OM2),possible post CABGAF,DM2, hypertension, hyperlipidemia,former smoker,OSA on CPAP, obesity, and seen today for 3 month follow-up.   Past Medical History    Past Medical History:  Diagnosis Date  . CAD (coronary artery disease)    severe distal left main stenosis with plaque rupture, extending into the ostial LAD and occluded distal LAD, likely from embolization.  He had mild to moderate pRCA stenosis. He underwent POBA of distal LAD  . Diabetes mellitus without complication (HCC)   . HFrEF (heart failure with reduced ejection fraction) (HCC)    2/6 TTE 45-50%, severe LVH, G1DD  . History of tobacco use   . Hyperlipidemia   . Hypertension   . OSA on CPAP    wears CPAP qHS per pt  . PAC (premature atrial contraction)    PAC verus post cabg AF on amiodarone s/p cath. Not on OAC.  . S/P CABG x 4    2/7 LIMA-LAD, SVG-diagonal, SVG-OM1 to OM2   Past Surgical History:  Procedure Laterality Date  . CARDIAC CATHETERIZATION    . CORONARY ARTERY BYPASS GRAFT N/A 09/17/2019   Procedure: CORONARY ARTERY BYPASS GRAFTING (CABG), ON PUMP, TIMES FOUR, USING LEFT INTERNAL MAMMARY ARTERY AND RIGHT GREATER SAPHENOUS VEIN HARVESTED ENDOSCOPICALLY;  Surgeon: Linden Dolin, MD;  Location: [facility] OR;  Service: Open Heart Surgery;  Laterality: N/A;  . CORONARY/GRAFT ACUTE MI REVASCULARIZATION N/A 09/16/2019   Procedure: Coronary/Graft Acute MI Revascularization;  Surgeon: Iran Ouch, MD;  Location: [facility] INVASIVE CV LAB;  Service: Cardiovascular;   Laterality: N/A;  . LEFT HEART CATH AND CORONARY ANGIOGRAPHY N/A 09/16/2019   Procedure: LEFT HEART CATH AND CORONARY ANGIOGRAPHY;  Surgeon: Iran Ouch, MD;  Location: [facility] INVASIVE CV LAB;  Service: Cardiovascular;  Laterality: N/A;  . TEE WITHOUT CARDIOVERSION N/A 09/17/2019   Procedure: TRANSESOPHAGEAL ECHOCARDIOGRAM (TEE);  Surgeon: Linden Dolin, MD;  Location: [facility] OR;  Service: Open Heart Surgery;  Laterality: N/A;  . TONSILLECTOMY    . VASECTOMY      Allergies  Allergies  Allergen Reactions  . Lisinopril Cough  . Angiotensin Receptor Blockers     cough    History of Present Illness    Brian Chapman is a 73 y.o. male with PMH as above. He mostly follows at the [place]. He has history of previous cardiac catheterization and mild CAD before his recent CABG; however, details unknown and records not available per EMR. He works as a Production designer, theatre/television/film at [employer].Of note, he had COVID-19 06/2018 for ~10 days but did not require hospitalization and no residual symptoms.   On 09/16/19, he was on his way to work when he started to have severe substernal chest tightness and heaviness with associated shortness of breath. He wasfound to have severe distal left main stenosis with plaque rupture, extending into the ostial LAD and occluded distal LAD, likely from embolization. He had mild to moderate pRCAs.He underwentPOBAof distal LAD.TTE showed EF 45 to 50%.He underwent CABGx4 as copied and pasted below 09/17/19(LIMA-LAD, SVG-diagonal, SVG-OM1 to OM2). Right sided EVH was  from the right thigh and lower leg. Operative LVSF nl and with diffuse disease consistent with underlying DM2. He was continued on ASA, atorvastatin, metoprolol. AF was noted on telemetry during the admission, though at discharge, no clear AFwas notedand insteadnoted werePACs.  He was seen in clinic in Spring 2021 and had started cardiac rehab and doing well and started back at work, escalating to a full schedule  at [employer] (sometimes 8-10 hrs) with fatigue noted toward the end of the day and resolving with sleep. He did note that his job was stressful. He was not sleeping more than 4-5 hours, which was his baseline.  CPAP compliance confirmed. He had significantly reduced his intake of salt. He reported 1 cup of coffee in the AM. He was started on low dose chlorthalidone for suspected increased volume status 2/2 BP and wt increase. After confirming with CTS, he was continued on Plavix with amiodarone discontinued.   5/10 echo was performed and showed EF improved from 45-50% to 60-65%, mild LVH, and mild LAE. Cardiac rehab BP 5/17 was 142/78 and 5/19 was 112/60 with resting HR 60bpm.  Seen 04/11/20 and still reported fatigue, as well as working long hours and sleeping approximately 3 /4 to 5 hours per night (baseline).  [facility] and was restarted on valsartan. He had noticed an increase in his baseline acid reflux, sometimes occurring in the morning, and not always associated with food.  When he took an antiacid, he reported some relief.  He was occasionally eating pizza with hot peppers.  He reported 2-3 sodas per week, 1 cup of coffee daily, and the occasional chocolate.  No alcohol. He hoped to soon have another break for a vacation. No medication changes.  Today, 07/15/2020, he returns to clinic and notes that he continues to have symptoms of acid reflux without clear etiology and more often than he feels he should have on a regular basis.  He denies any clear trigger with food, and he also tries to avoid spicy foods.   He reports that juice or crackers make his symptoms better.  He continues to drink 2-3 sodas per week and 1 cup of coffee daily, as well as eat the occasional chocolate.  He does report ongoing stress associated with his job.  He took vacation last week; however, he ended up coming into the office for 3 days, despite being on vacation.  He has not yet tried to correlate his reflux  symptoms with that of his stress level but intends to do so going forward.  He notes symptoms of reflux as worse in the evening.  He does note that there are days in which she does not have any reflux symptoms and other days with reflux occurring frequently.  He also feels as if his symptoms may worsen with laying down.  He has not yet tried to correlate them with stress.  BP today elevated at 150/86 with tachycardic heart rate of 114bpm and repeat rate of 100 BPM.  Vagal maneuver performed in clinic with rate still elevated and more frequent PACs at that time. He denies any feelings of racing heart rate or palpitations.  No presyncope or syncope.  He reports elevated pressure and heart rate due to rushing into the appointment and stress associated with job.  He also reports that he had some salty [employer] crackers before coming to his appointment.  He continues to note significant fatigue, often tiring around 3 PM.  SpO2 low at  95% with repeat SpO2 95%. He denies any signs or symptoms consistent with DVT, including asymmetric swelling, erythema, or palpable cord.  No chest pain at rest, other than his reflux as above.  No shortness of breath at rest or with exertion.  No signs or symptoms of increased volume status per patient.  Weight today is decreased from his previous clinic visit and 2 of 6 pounds with previous clinic weight 211 pounds.  He has been going to [employer] twice per week.  He is sparing with salt.  No signs or symptoms of bleeding.  Of note, home BP brought to clinic and most recent copied below with remaining log to be scanned into EMR.  Home Medications    Prior to Admission medications   Medication Sig Start Date End Date Taking? Authorizing Provider  allopurinol (ZYLOPRIM) 300 MG tablet Take 300 mg by mouth daily.    [provider]  amiodarone (PACERONE) 200 MG tablet Take 1 tablet (200 mg total) by mouth 2 (two) times daily. 09/21/19   Leary Roca, PA-C  aspirin EC  81 MG tablet Take 81 mg by mouth daily.    [provider]  atorvastatin (LIPITOR) 80 MG tablet Take 80 mg by mouth daily.    [provider]  betamethasone valerate lotion (VALISONE) 0.1 % Apply 1 application topically daily as needed for irritation.    [provider]  clopidogrel (PLAVIX) 75 MG tablet Take 1 tablet (75 mg total) by mouth daily. 09/22/19   Leary Roca, PA-C  colchicine 0.6 MG tablet Take 1 tablet (0.6 mg total) by mouth daily. 09/22/19   Leary Roca, PA-C  cyclobenzaprine (FLEXERIL) 5 MG tablet Take 1 tablet (5 mg total) by mouth 3 (three) times daily as needed for muscle spasms. 06/30/19   Joni Reining, PA-C  desonide (DESOWEN) 0.05 % cream Apply 1 application topically 2 (two) times daily.    [provider]  diclofenac Sodium (VOLTAREN) 1 % GEL Apply 4 g topically 4 (four) times daily.    [provider]  glucose blood test strip Test blood ac and hs...the patient has been instructed. 10/02/19   Linden Dolin, MD  indomethacin (INDOCIN) 50 MG capsule Take 50 mg by mouth daily as needed. Gout pain    [provider]  ketoconazole (NIZORAL) 2 % shampoo Apply 1 application topically 2 (two) times a week.    [provider]  Lancets [facility] ULTRASOFT) lancets Use as instructed...test ac and hs. 10/02/19   Linden Dolin, MD  metFORMIN (GLUCOPHAGE) 500 MG tablet Take 500 mg by mouth daily with breakfast.    [provider]  metoprolol tartrate (LOPRESSOR) 25 MG tablet Take 12.5 mg by mouth 2 (two) times daily.    [provider]  nitroGLYCERIN (NITROSTAT) 0.4 MG SL tablet Place 1 tablet (0.4 mg total) under the tongue every 5 (five) minutes as needed for chest pain. 10/13/19 01/11/20  Marisue Ivan D, PA-C    Review of Systems    He reports fatigue towards the end of the day and ongoing increase in reflux.  He denies racing heart rate, palpitations, dyspnea, pnd, orthopnea,  n, v, dizziness, syncope, edema, weight gain, or early satiety.  All other systems reviewed and are otherwise negative except as noted above.  Physical Exam    VS:  BP (!) 150/86 (BP Location: Left Arm, Patient Position: Sitting, Cuff Size: Normal)   Pulse (!) 114   Ht 5'  6" (1.676 m)   Wt 206 lb (93.4 kg)   SpO2 95%   BMI 33.25 kg/m  , BMI Body mass index is 33.25 kg/m. GEN: Well nourished, well developed, in no acute distress. HEENT: normal. Neck: Supple, no JVD, carotid bruits, or masses. Cardiac: Tachycardic but regular with frequent extrasystole appreciated (further increase in extrasystole appreciated s/p vagal maneuver), 1/6 systolic murmur.  No rubs, or gallops.No clubbing, cyanosis. no LEE.  Radials/DP/PT 2+ and equal bilaterally.  Respiratory:  Respirations regular and unlabored, clear to auscultation bilaterally. GI: Soft, nontender, nondistended, BS + x 4. MS: no deformity or atrophy. Skin: warm and dry, no rash. Neuro:  Strength and sensation are intact. Psych: Normal affect.  Accessory Clinical Findings    ECG personally reviewed by me today -sinus tachycardia with first-degree AV block and PACs, 114 bpm, previous inferior infarct as noted in prior EKGs- no acute changes.  VITALS Reviewed today   Temp Readings from Last 3 Encounters:  10/16/19 (!) 97.3 F (36.3 C) (Temporal)  10/02/19 (!) 97.5 F (36.4 C) (Temporal)  09/21/19 98.5 F (36.9 C) (Oral)   BP Readings from Last 3 Encounters:  07/15/20 (!) 150/86  04/11/20 108/60  01/01/20 134/90   Pulse Readings from Last 3 Encounters:  07/15/20 (!) 114  04/11/20 61  01/01/20 (!) 59    Wt Readings from Last 3 Encounters:  07/15/20 206 lb (93.4 kg)  04/11/20 211 lb (95.7 kg)  02/15/20 211 lb 6.4 oz (95.9 kg)     LABS  reviewed today   CareEverwhere Labs present and most recent? Yes/No: No  Lab Results  Component Value Date   WBC 7.6 10/13/2019   HGB 13.7 10/13/2019   HCT 42.9 10/13/2019   MCV 87  10/13/2019   PLT 337 10/13/2019   Lab Results  Component Value Date   CREATININE 1.11 04/11/2020   BUN 14 04/11/2020   NA 139 04/11/2020   K 4.0 04/11/2020   CL 100 04/11/2020   CO2 20 04/11/2020   Lab Results  Component Value Date   ALT 18 11/29/2019   AST 21 11/29/2019   ALKPHOS 95 11/29/2019   BILITOT 0.3 11/29/2019   Lab Results  Component Value Date   CHOL 94 (L) 11/29/2019   HDL 34 (L) 11/29/2019   LDLCALC 40 11/29/2019   TRIG 109 11/29/2019   CHOLHDL 2.8 11/29/2019    Lab Results  Component Value Date   HGBA1C 6.6 (H) 09/21/2019   No results found for: TSH   STUDIES/PROCEDURES reviewed today   Echo 12/18/19 1. Left ventricular ejection fraction, by estimation, is 60 to 65%. The  left ventricle has normal function. The left ventricle has no regional  wall motion abnormalities. There is mild left ventricular hypertrophy.  Left ventricular diastolic parameters  were normal.  2. Right ventricular systolic function is normal. The right ventricular  size is normal. Tricuspid regurgitation signal is inadequate for assessing  PA pressure.  3. Left atrial size was mildly dilated.  4. The mitral valve is normal in structure. No evidence of mitral valve  regurgitation. No evidence of mitral stenosis.  5. The aortic valve is normal in structure. Aortic valve regurgitation is  not visualized. Mild to moderate aortic valve sclerosis/calcification is  present, without any evidence of aortic stenosis.  6. The inferior vena cava is normal in size with greater than 50%  respiratory variability, suggesting right atrial pressure of 3 mmHg.  Comparison(s): LV EF 45-50%, severe AK LV, apical and  inferoapical and  distal anteroseptal/anteroapical walls.   09/17/19 CABGx4  Coronary Artery Bypass Grafting x4 Left Internal Mammary Artery to Distal Left Anterior Descending Coronary Artery;Saphenous Vein Graft torightPosterolateralCoronary Artery and  1stObtuse Marginal Branch of Left Circumflex Coronary Arteryas a sequenced graft;Sapheonous Vein Graft to1stDiagonal Branch Coronary Artery;Endoscopic Vein Harvest fromrightThigh and Lower Leg;  Rigid sternal reconstruction with linear plating system Operative Findings: ? preservedleft ventricular systolic function ? goodquality left internal mammary artery conduit ? Goodquality saphenous vein conduit ? fairquality target vessels for grafting due to diffuse disease consistent with underlying diabetes  The1stdiagonal branch of the left anterior descending coronary artery was grafted using a reversed saphenous vein graft in an end-to-side fashion. At the site of distal anastomosis the target vessel was fairquality and measured approximately 1.56mm in diameter.  Therightposterolateralbranch of the right coronary artery was grafted using a reversed saphenous vein graft in an end-to-side fashion. At the site of distal anastomosis the target vessel was fairquality and measured approximately 1.46mm in diameter. Next, the1stobtuse marginal branch of the left circumflex coronary artery was grafted in an side-to-side fashionto create a sequenced graft configuration with the Right PLA. At the site of distal anastomosis the target vessel was fairquality and measured approximately 1.79mm in diameter..  The distal left anterior coronary artery was grafted with the left internal mammary artery in an end-to-side fashion. At the site of distal anastomosis the target vessel was fairquality and measured approximately1.2mm in diameter. All proximal vein graft anastomoses were placed directly to the ascending aorta prior to removal of the aortic cross clamp. The septal myocardial temperature rose rapidly after reperfusion of the left internal mammary artery graft. Deairing procedures were performed and the aortic cross clamp was removed  2/7 Intraoperative TEE PRE-OP FINDINGS  Left  Ventricle: The left ventricle has mild-moderately reduced systolic  function, with an ejection fraction of 40-45%.  Right Ventricle: The right ventricle has normal systolic function.  Left Atrium: The left atrial appendage is well visualized and there is  evidence of thrombus present. The left atrial appendage is well visualized  and there is no evidence of thrombus present.  Right Atrium: Right atrial pressure is estimated at 10 mmHg.  Mitral Valve: The mitral valve is normal in structure. Mitral valve  regurgitation is trivial by color flow Doppler.  Tricuspid Valve: The tricuspid valve was normal in structure. Tricuspid  valve regurgitation is trivial by color flow Doppler.  Dorris Singh MD  Electronically signed by Dorris Singh MD  Signature Date/Time: 09/21/2019  2/7 US Doppler pre CABG Carotid and [place] Summary:  Right Carotid: The extracranial vessels were near-normal with only minimal  wall thickening or plaque.  Left Carotid: The extracranial vessels were near-normal with only minimal  wall thickening or plaque.  Vertebrals: Bilateral vertebral arteries demonstrate antegrade flow.  Subclavians: Not assessed.  Right Upper Extremity: Doppler waveforms remain within normal limits with  right radial compression. Doppler waveforms decrease <50% with right ulnar  compression.  Left Upper Extremity: Doppler waveforms remain within normal limits with  left radial compression. Doppler waveforms remain within normal limits  with left ulnar compression.   Cath 09/16/19: 1. Severe distal left main stenosis with plaque rupture extending into the ostial LAD with occluded distal LAD likely due to embolization. Mild to moderate proximal RCA stenosis. Surgical targets include mid LAD, first diagonal and OM 3. 2. Normal LV systolic function and moderately elevated left ventricular end-diastolic pressure. 3. Successful balloon angioplasty to the distal LAD to establish TIMI-3 flow.  Cardiac catheterization was performed via the right radial artery. 4. Successful intra-aortic balloon pump placement via the right common femoral artery. Recommendations: Given this the left main involvement, diffuse LAD disease and diabetic status, I think surgical revascularization with urgent/emergent CABG is indicated. The patient did not receive any oral antiplatelet medication other than aspirin. Continue unfractionated heparin. I discussed the case with Dr. [name].  TTE 09/16/19: 1. Left ventricular ejection fraction, by visual estimation, is 45 to  50%. The left ventricle has hyperdynamic function. There is severely  increased left ventricular hypertrophy.  2. Definity contrast agent was given IV to delineate the left ventricular  endocardial borders.  3. The left ventricle demonstrates regional wall motion abnormalities.  4. Severe akinesis of the left ventricular, apical apical segment and  inferoapical and distal anteroseptal/anteroapical walls. No apical  thrombus.  5. Left ventricular diastolic parameters are consistent with Grade I  diastolic dysfunction (impaired relaxation).  6. Global right ventricle has mildly reduced systolic function.The right  ventricular size is normal. No increase in right ventricular wall  thickness.  7. Hypokinetic right ventricular apex.  8. Left atrial size was normal.  9. Right atrial size was normal.  10. Mild mitral annular calcification.  11. The mitral valve is grossly normal. Trivial mitral valve  regurgitation.  12. The tricuspid valve is grossly normal.  13. The tricuspid valve is grossly normal. Tricuspid valve regurgitation  is trivial.  14. The aortic valve is tricuspid. Aortic valve regurgitation is not  visualized. Mild aortic valve sclerosis without stenosis.  15. The pulmonic valve was grossly normal. Pulmonic valve regurgitation is  not visualized.  16. The interatrial septum was not well visualized.    05/10/2020; 127/69, 66 bpm, 21: 15 05/11/2020: 120/68, 56 bpm, 7:30 AM 05/11/20: 103/61, 64 bpm, 20:19 05/12/2020: 120/67, 62 bpm, 19: 50 05/13/2020 117/73, 62 bpm, 7:00 05/13/2020 124/70, 69 bpm, 22:15 05/14/2020: 120/64, 63 bpm, 20: 40 05/15/2020 115/82, 63 bpm, 20: 50 05/17/2020 123/70, 56 bpm, 6:14 05/22/2020 102/57, 68 bpm, 21:00 05/26/2020: 136/70, 59 bpm, 7:58 05/30/2020 104/52, 59 bpm, 20:30 06/06/2020 115/63, 68 bpm, 21:00 06/12/2020 117/66, 70 bpm, 23:15 06/15/2020 119/65, 68 bpm, 9:30 06/24/2020:118/67, 65 bpm, 7:45 07/10/2020: 126/68, 72 bpm, 9:00 07/14/2020 140/83, 70 bpm, 19:42  07/15/2020 165/84 80 bpm, 4:40  Assessment & Plan   CAD(LIMA-LAD, SVG-diagonal, SVG-OM1 to OM2) S/p 2/6STEMI; S/p 2/7 CABG x4 --No CP but does report ongoing reflux. Occurs AM/PM and without clear association to food.  Laying flat makes it worse and crackers / juice make it better.  Previously discussed with primary MD with recommendation to monitor. He also continues to note fatigue towards the end of the day and ongoing work stressors.  EKG today shows ST with BP elevated and attributed to work stress and running late. No acute ST/T changes on EKG. No CP or SOB with exertion when working out. Euvolemic on exam with wt down from previous clinic visit. Echo, obtained after CABG and escalation of GDMT as above and with normalization of EF.  --Obtain cardiac monitoring with 2 week Zio XT for sinus tachycardia and PACs appreciated today as below.  --ContinueDAPT with ASA and Plavix.  Continue as needed SL nitro.  At RTC, if continued room in pressure and heart rate, escalate BB +/- additional antihypertensive for further BP support if needed.  Will defer any increase in BB today / for now given rates his rates are usually 50-60s bpm with soft BP.  He will continue to monitor his HR  and BP at home with cardiac monitoring in place. Further recommendations, if indicated, pending home monitoring of BP/HR and 2  week Zio XT. As below, will plan for repeat BMET, CBC, and TSH pending cardiac monitoring results. Continue Valsartan 320 mg and Lipitor 80 mg daily and aggressive risk factor modification.   He will continue to work on lifestyle changes, including job stress and increase of activity as tolerated.  He will start to log his reflux sx in relation to work stress and keep [place] posted.   Sinus tachycardia with PACs History of Atrial fibrillation versus ST with PACs --No racing HR or palpitations. Attributes elevated HR/BP today to stressors and rushing to his visit. He does have a documented h/o PAF versus ST with PACs. Vagal maneuver performed, worsening PACs, and with ongoing ST. EKG / rhythm strip reviewed with DOD and consistent with ST with PACs and not Afib per DOD. Previously on amiodarone for suspected Afib versus PACs, discontinued when pt maintaining NSR at RTC. Given ST continued and still noted at end of visit today and s/p vagal maneuvers performed in clinic, recommend 2 week Zio XT cardiac monitoring today.  Pending cardiac monitoring, recommend repeat BMET, CBC, TSH for further assessment.  SPO2 noted to be 95%, confirmed with repeat measurement.  No signs or symptoms consistent with DVT, reviewed today in detail.  No shortness of breath or chest pain concerning for PE. Ongoing reflux sx as above, which he will continue to monitor and start to log in relation to his stress.  If clear evidence of PAF on cardiac monitoring, will need to discuss rate and  rhythm control, as well as anticoagulation at that time. Will defer for now and pending monitor and if clear evidence of Afib.   HFrEF(EF 45-50%) with subsequent normalization of EF (60 to 65%) / Chronic HFpEF --No SOBat rest. Repeat echo as above with normalization of EF. Euvolemic and well compensated. Wt down from previous clinic visit. Attributes elevated HR and BP 2/2 stressors as above. Reassess BP at RTC. Cardiac monitoring recommended with 2  week Zio XT placed today as above. Continue valsartan 320 mg and chlorthalidone. Defer escalation of BB for now and pending his cardiac monitor and further home BP measurements, given previous history of bradycardia and soft BP.  Recommend escalation of BB at RTC if ongoing elevated rate and BP.  HTN --BP not well controlled and attributed to recent stress / rushing. Also notes recent salty food. Home BP log brought and copied as above with BP usually well controlled, as well as HR. Continue to monitor BP at home.Cardiac monitoring as above. Recommend repeat labs pending cardiac monitoring as above. Reassess at RTC.  HLD --Continue statin. LDL well controlled at 40.   DM2 --Most recent A1C as above.Continue metformin.Monitoring per PCP.   OSA on CPAP --Compliant with CPAP.  Medication changes: None Labs ordered: None Studies / Imaging ordered: Zio XT for 2 weeks.   Future considerations: BMET, CBC, TSH pending monitor, OAC and rat/ rhythm e control if clear evidence of Afib / pending monitoring. BB increase deferred given usually controlled BP and HR.  Disposition: RTC 3 months or sooner if needed.   [place] [place], PA-C 07/15/2020

## 2020-07-15 ENCOUNTER — Ambulatory Visit (INDEPENDENT_AMBULATORY_CARE_PROVIDER_SITE_OTHER): Payer: BC Managed Care – PPO

## 2020-07-15 ENCOUNTER — Other Ambulatory Visit: Payer: Self-pay

## 2020-07-15 ENCOUNTER — Encounter: Payer: Self-pay | Admitting: Physician Assistant

## 2020-07-15 ENCOUNTER — Ambulatory Visit (INDEPENDENT_AMBULATORY_CARE_PROVIDER_SITE_OTHER): Payer: BC Managed Care – PPO | Admitting: Physician Assistant

## 2020-07-15 VITALS — BP 150/86 | HR 114 | Ht 66.0 in | Wt 206.0 lb

## 2020-07-15 DIAGNOSIS — K219 Gastro-esophageal reflux disease without esophagitis: Secondary | ICD-10-CM

## 2020-07-15 DIAGNOSIS — Z8679 Personal history of other diseases of the circulatory system: Secondary | ICD-10-CM | POA: Diagnosis not present

## 2020-07-15 DIAGNOSIS — I5032 Chronic diastolic (congestive) heart failure: Secondary | ICD-10-CM

## 2020-07-15 DIAGNOSIS — E785 Hyperlipidemia, unspecified: Secondary | ICD-10-CM

## 2020-07-15 DIAGNOSIS — I2510 Atherosclerotic heart disease of native coronary artery without angina pectoris: Secondary | ICD-10-CM

## 2020-07-15 DIAGNOSIS — Z951 Presence of aortocoronary bypass graft: Secondary | ICD-10-CM

## 2020-07-15 DIAGNOSIS — I10 Essential (primary) hypertension: Secondary | ICD-10-CM

## 2020-07-15 DIAGNOSIS — E119 Type 2 diabetes mellitus without complications: Secondary | ICD-10-CM

## 2020-07-15 DIAGNOSIS — R000 Tachycardia, unspecified: Secondary | ICD-10-CM

## 2020-07-15 DIAGNOSIS — G4730 Sleep apnea, unspecified: Secondary | ICD-10-CM

## 2020-07-15 DIAGNOSIS — I491 Atrial premature depolarization: Secondary | ICD-10-CM

## 2020-07-15 NOTE — Patient Instructions (Signed)
Medication Instructions:   Your physician recommends that you continue on your current medications as directed. Please refer to the Current Medication list given to you today.  *If you need a refill on your cardiac medications before your next appointment, please call your pharmacy*   Lab Work: None ordered   Testing/Procedures:  Your physician has recommended that you wear a Zio monitor XT for 2 weeks. This monitor is a medical device that records the heart's electrical activity. Doctors most often use these monitors to diagnose arrhythmias. Arrhythmias are problems with the speed or rhythm of the heartbeat. The monitor is a small device applied to your chest. You can wear one while you do your normal daily activities. While wearing this monitor if you have any symptoms to push the button and record what you felt. Once you have worn this monitor for the period of time provider prescribed (Usually 14 days), you will return the monitor device in the postage paid box. Once it is returned they will download the data collected and provide [place] with a report which the provider will then review and we will call you with those results. Important tips:  1. Avoid showering during the first 24 hours of wearing the monitor. 2. Avoid excessive sweating to help maximize wear time. 3. Do not submerge the device, no hot tubs, and no swimming pools. 4. Keep any lotions or oils away from the patch. 5. After 24 hours you may shower with the patch on. Take brief showers with your back facing the shower head.  6. Do not remove patch once it has been placed because that will interrupt data and decrease adhesive wear time. 7. Push the button when you have any symptoms and write down what you were feeling. 8. Once you have completed wearing your monitor, remove and place into box which has postage paid and place in your outgoing mailbox.  9. If for some reason you have misplaced your box then call our office and we  can provide another box and/or mail it off for you.         Follow-Up: At [facility], you and your health needs are our priority.  As part of our continuing mission to provide you with exceptional heart care, we have created designated Provider Care Teams.  These Care Teams include your primary Cardiologist (physician) and Advanced Practice Providers (APPs -  Physician Assistants and Nurse Practitioners) who all work together to provide you with the care you need, when you need it.  We recommend signing up for the patient portal called "MyChart".  Sign up information is provided on this After Visit Summary.  MyChart is used to connect with patients for Virtual Visits (Telemedicine).  Patients are able to view lab/test results, encounter notes, upcoming appointments, etc.  Non-urgent messages can be sent to your provider as well.   To learn more about what you can do with MyChart, go to [email or web address].    Your next appointment:   3 month(s) (If we need to move appt up sooner we can reschedule due to symptoms)  The format for your next appointment:   In Person  Provider:   You may see Lorine Bears, MD or one of the following Advanced Practice Providers on your designated Care Team:    Nicolasa Ducking, NP  Eula Listen, PA-C  Marisue Ivan, PA-C  Cadence [place], [place]  Gillian Shields, NP    Other Instructions  1) Westley Foots, PA is going to talk with our  EP provider as well.  2) Call our office with symptoms of stable and experience - shortness of breath, dizziness, chest pain.  3) Call 911 if you feel unstable - shortness of breath, dizziness, chest pain.

## 2020-07-26 DIAGNOSIS — G4733 Obstructive sleep apnea (adult) (pediatric): Secondary | ICD-10-CM | POA: Diagnosis not present

## 2020-08-14 DIAGNOSIS — S39012A Strain of muscle, fascia and tendon of lower back, initial encounter: Secondary | ICD-10-CM | POA: Diagnosis not present

## 2020-08-14 DIAGNOSIS — X500XXA Overexertion from strenuous movement or load, initial encounter: Secondary | ICD-10-CM | POA: Diagnosis not present

## 2020-08-23 DIAGNOSIS — X500XXA Overexertion from strenuous movement or load, initial encounter: Secondary | ICD-10-CM | POA: Diagnosis not present

## 2020-08-23 DIAGNOSIS — M545 Low back pain, unspecified: Secondary | ICD-10-CM | POA: Diagnosis not present

## 2020-08-23 DIAGNOSIS — Y990 Civilian activity done for income or pay: Secondary | ICD-10-CM | POA: Diagnosis not present

## 2020-09-02 DIAGNOSIS — Z951 Presence of aortocoronary bypass graft: Secondary | ICD-10-CM | POA: Diagnosis not present

## 2020-09-02 DIAGNOSIS — Z0189 Encounter for other specified special examinations: Secondary | ICD-10-CM | POA: Diagnosis not present

## 2020-09-02 DIAGNOSIS — I2510 Atherosclerotic heart disease of native coronary artery without angina pectoris: Secondary | ICD-10-CM | POA: Diagnosis not present

## 2020-09-02 DIAGNOSIS — I252 Old myocardial infarction: Secondary | ICD-10-CM | POA: Diagnosis not present

## 2020-09-02 DIAGNOSIS — I10 Essential (primary) hypertension: Secondary | ICD-10-CM | POA: Diagnosis not present

## 2020-09-03 DIAGNOSIS — Z0189 Encounter for other specified special examinations: Secondary | ICD-10-CM | POA: Diagnosis not present

## 2020-09-03 DIAGNOSIS — E119 Type 2 diabetes mellitus without complications: Secondary | ICD-10-CM | POA: Diagnosis not present

## 2020-09-03 DIAGNOSIS — Z955 Presence of coronary angioplasty implant and graft: Secondary | ICD-10-CM | POA: Diagnosis not present

## 2020-09-03 DIAGNOSIS — Z23 Encounter for immunization: Secondary | ICD-10-CM | POA: Diagnosis not present

## 2020-09-03 DIAGNOSIS — I119 Hypertensive heart disease without heart failure: Secondary | ICD-10-CM | POA: Diagnosis not present

## 2020-09-03 DIAGNOSIS — I2510 Atherosclerotic heart disease of native coronary artery without angina pectoris: Secondary | ICD-10-CM | POA: Diagnosis not present

## 2020-10-31 ENCOUNTER — Other Ambulatory Visit: Payer: Self-pay

## 2020-10-31 ENCOUNTER — Ambulatory Visit: Payer: BC Managed Care – PPO | Admitting: Cardiovascular Disease

## 2020-10-31 ENCOUNTER — Ambulatory Visit (INDEPENDENT_AMBULATORY_CARE_PROVIDER_SITE_OTHER): Payer: BC Managed Care – PPO | Admitting: Cardiovascular Disease

## 2020-10-31 VITALS — BP 98/64 | HR 60 | Ht 66.0 in | Wt 214.0 lb

## 2020-10-31 DIAGNOSIS — I5020 Unspecified systolic (congestive) heart failure: Secondary | ICD-10-CM

## 2020-10-31 DIAGNOSIS — E785 Hyperlipidemia, unspecified: Secondary | ICD-10-CM

## 2020-10-31 DIAGNOSIS — R000 Tachycardia, unspecified: Secondary | ICD-10-CM | POA: Diagnosis not present

## 2020-10-31 DIAGNOSIS — E1100 Type 2 diabetes mellitus with hyperosmolarity without nonketotic hyperglycemic-hyperosmolar coma (NKHHC): Secondary | ICD-10-CM

## 2020-10-31 DIAGNOSIS — I10 Essential (primary) hypertension: Secondary | ICD-10-CM

## 2020-10-31 DIAGNOSIS — Z951 Presence of aortocoronary bypass graft: Secondary | ICD-10-CM | POA: Diagnosis not present

## 2020-10-31 DIAGNOSIS — G4733 Obstructive sleep apnea (adult) (pediatric): Secondary | ICD-10-CM

## 2020-10-31 DIAGNOSIS — Z9989 Dependence on other enabling machines and devices: Secondary | ICD-10-CM

## 2020-10-31 NOTE — Patient Instructions (Signed)

## 2020-10-31 NOTE — Progress Notes (Signed)
Cardiology Office Note   Date:  10/31/2020   ID:  [name], DOB [birth year], MRN [id number]  PCP:  [facility]  Cardiologist:   Lorine Bears, MD   Chief Complaint  Patient presents with  . Follow-up    3 months       History of Present Illness: Brian Chapman is a 75 y.o. male who presents for a follow-up visit regarding coronary artery disease.  He has known history of type 2 diabetes, essential hypertension, hyperlipidemia, previous tobacco use, obstructive sleep apnea on CPAP and obesity. He works as a Production designer, theatre/television/film at [employer]. He presented in February 2021 with myocardial infarction.  Cardiac catheterization showed severe distal left main stenosis with plaque rupture extending into the ostial LAD with occluded distal LAD likely from embolization.  He underwent balloon angioplasty of the distal LAD to reestablish flow.  Echo showed an EF of 45 to 50%.  The patient underwent CABG.  EF was normal postoperatively.  Echocardiogram in May of last year showed an EF of 60 to 65%.  He had a ZIO monitor done in December which showed sinus rhythm with an average heart rate of 68 bpm.  He had 8 episodes of short SVT with occasional PACs and rare PVCs.  He has been doing well with no chest pain, shortness of breath or palpitations.  His blood pressure is mildly low today but he has minimal symptoms.  He reports that his blood pressure at home is around 130 mmHg.  Past Medical History:  Diagnosis Date  . CAD (coronary artery disease)    severe distal left main stenosis with plaque rupture, extending into the ostial LAD and occluded distal LAD, likely from embolization.  He had mild to moderate pRCA stenosis. He underwent POBA of distal LAD  . Diabetes mellitus without complication (HCC)   . HFrEF (heart failure with reduced ejection fraction) (HCC)    2/6 TTE 45-50%, severe LVH, G1DD  . History of tobacco use   . Hyperlipidemia   . Hypertension   . OSA on  CPAP    wears CPAP qHS per pt  . PAC (premature atrial contraction)    PAC verus post cabg AF on amiodarone s/p cath. Not on OAC.  . S/P CABG x 4    2/7 LIMA-LAD, SVG-diagonal, SVG-OM1 to OM2    Past Surgical History:  Procedure Laterality Date  . CARDIAC CATHETERIZATION    . CORONARY ARTERY BYPASS GRAFT N/A 09/17/2019   Procedure: CORONARY ARTERY BYPASS GRAFTING (CABG), ON PUMP, TIMES FOUR, USING LEFT INTERNAL MAMMARY ARTERY AND RIGHT GREATER SAPHENOUS VEIN HARVESTED ENDOSCOPICALLY;  Surgeon: Linden Dolin, MD;  Location: [facility] OR;  Service: Open Heart Surgery;  Laterality: N/A;  . CORONARY/GRAFT ACUTE MI REVASCULARIZATION N/A 09/16/2019   Procedure: Coronary/Graft Acute MI Revascularization;  Surgeon: Iran Ouch, MD;  Location: [facility] INVASIVE CV LAB;  Service: Cardiovascular;  Laterality: N/A;  . LEFT HEART CATH AND CORONARY ANGIOGRAPHY N/A 09/16/2019   Procedure: LEFT HEART CATH AND CORONARY ANGIOGRAPHY;  Surgeon: Iran Ouch, MD;  Location: [facility] INVASIVE CV LAB;  Service: Cardiovascular;  Laterality: N/A;  . TEE WITHOUT CARDIOVERSION N/A 09/17/2019   Procedure: TRANSESOPHAGEAL ECHOCARDIOGRAM (TEE);  Surgeon: Linden Dolin, MD;  Location: [facility] OR;  Service: Open Heart Surgery;  Laterality: N/A;  . TONSILLECTOMY    . VASECTOMY       Current Outpatient Medications  Medication Sig Dispense Refill  . allopurinol (ZYLOPRIM) 300 MG tablet  Take 300 mg by mouth daily.    Marland Kitchen aspirin EC 81 MG tablet Take 81 mg by mouth daily.    Marland Kitchen atorvastatin (LIPITOR) 80 MG tablet Take 80 mg by mouth daily.    . betamethasone valerate lotion (VALISONE) 0.1 % Apply 1 application topically daily as needed for irritation.    . chlorthalidone (HYGROTON) 25 MG tablet Take 0.5 tablets (12.5 mg total) by mouth daily. 15 tablet 5  . clopidogrel (PLAVIX) 75 MG tablet Take 1 tablet (75 mg total) by mouth daily. 30 tablet 2  . cyclobenzaprine (FLEXERIL) 5 MG tablet Take 1 tablet (5 mg total) by mouth 3 (three)  times daily as needed for muscle spasms. 15 tablet 0  . desonide (DESOWEN) 0.05 % cream Apply 1 application topically 2 (two) times daily.    . diclofenac Sodium (VOLTAREN) 1 % GEL Apply 4 g topically 4 (four) times daily.    Marland Kitchen glucose blood test strip Test blood ac and hs...the patient has been instructed. 100 each 12  . indomethacin (INDOCIN) 50 MG capsule Take 50 mg by mouth daily as needed. Gout pain    . ketoconazole (NIZORAL) 2 % shampoo Apply 1 application topically 2 (two) times a week.    . Lancets (ONETOUCH ULTRASOFT) lancets Use as instructed...test ac and hs. 100 each 12  . metFORMIN (GLUCOPHAGE) 500 MG tablet Take 500 mg by mouth daily with breakfast.    . metoprolol tartrate (LOPRESSOR) 25 MG tablet Take 12.5 mg by mouth 2 (two) times daily.    . nitroGLYCERIN (NITROSTAT) 0.4 MG SL tablet Place 1 tablet (0.4 mg total) under the tongue every 5 (five) minutes as needed for chest pain. 25 tablet 3  . valsartan (DIOVAN) 320 MG tablet Take 320 mg by mouth daily.     No current facility-administered medications for this visit.    Allergies:   Lisinopril and Angiotensin receptor blockers    Social History:  The patient  reports that he quit smoking about 27 years ago. His smoking use included cigarettes. He has a 1.25 pack-year smoking history. He has never used smokeless tobacco. He reports that he does not drink alcohol and does not use drugs.   Family History:  The patient's family history includes Heart attack in his father.    ROS:  Please see the history of present illness.   Otherwise, review of systems are positive for none.   All other systems are reviewed and negative.    PHYSICAL EXAM: VS:  BP 98/64   Pulse 60   Ht 5\' 6"  (1.676 m)   Wt 214 lb (97.1 kg)   BMI 34.54 kg/m  , BMI Body mass index is 34.54 kg/m. GEN: Well nourished, well developed, in no acute distress  HEENT: normal  Neck: no JVD, carotid bruits, or masses Cardiac: RRR; no murmurs, rubs, or  gallops,no edema  Respiratory:  clear to auscultation bilaterally, normal work of breathing GI: soft, nontender, nondistended, + BS MS: no deformity or atrophy  Skin: warm and dry, no rash Neuro:  Strength and sensation are intact Psych: euthymic mood, full affect   EKG:  EKG is ordered today. The ekg ordered today demonstrates sinus rhythm with first-degree AV block nonspecific T wave changes.   Recent Labs: 11/29/2019: ALT 18 04/11/2020: BUN 14; Creatinine, Ser 1.11; Potassium 4.0; Sodium 139    Lipid Panel    Component Value Date/Time   CHOL 94 (L) 11/29/2019 1513   TRIG 109 11/29/2019 1513  HDL 34 (L) 11/29/2019 1513   CHOLHDL 2.8 11/29/2019 1513   LDLCALC 40 11/29/2019 1513      Wt Readings from Last 3 Encounters:  10/31/20 214 lb (97.1 kg)  07/15/20 206 lb (93.4 kg)  04/11/20 211 lb (95.7 kg)        No flowsheet data found.    ASSESSMENT AND PLAN:  1.  Coronary artery disease involving native coronary artery status post CABG: He is doing well with no anginal symptoms.  Continue medical therapy.  He is on dual antiplatelet therapy given that he had myocardial infarction in February 2021.  We do have the option of stopping clopidogrel in the near future.  2.  Sinus tachycardia with PACs: Well-controlled with small dose metoprolol.  3.  Essential hypertension: Blood pressure is well controlled.  4.  Hyperlipidemia: Continue high-dose atorvastatin.  Lipid profile last year showed an LDL of 40.  5.  Type 2 diabetes: Managed at the [place]    Disposition:   FU with me in 6 months  Signed,  Lorine Bears, MD  10/31/2020 1:02 PM    [facility]

## 2020-12-30 DIAGNOSIS — H35033 Hypertensive retinopathy, bilateral: Secondary | ICD-10-CM | POA: Diagnosis not present

## 2021-05-03 IMAGING — CT CT CHEST W/O CM
3 of 4 series · 16 of 36 positions shown, 18 images · non-contrast
Comparison: None.

CLINICAL DATA: Preoperative CABG

EXAM:
CT CHEST WITHOUT CONTRAST
TECHNIQUE: Multidetector CT imaging of the chest was performed following the
standard protocol without IV contrast.

[Series 3: thorax 5.0 i31f 2 · axial · 0.98mm/px · z∈[+1146,+1396]mm · 7 of 68 slices shown, 9 images]
[im 9/68  mediastinal]
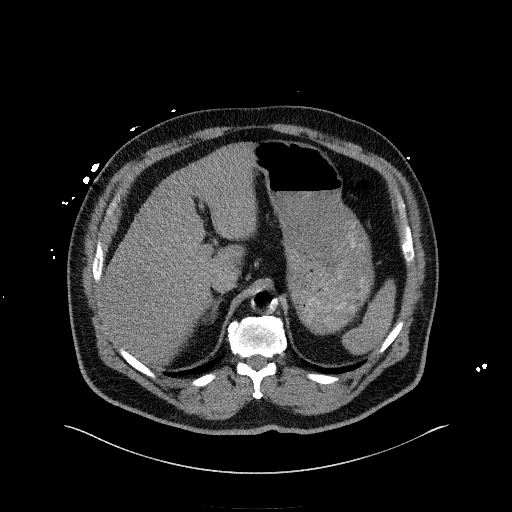
[im 9/68  lung]
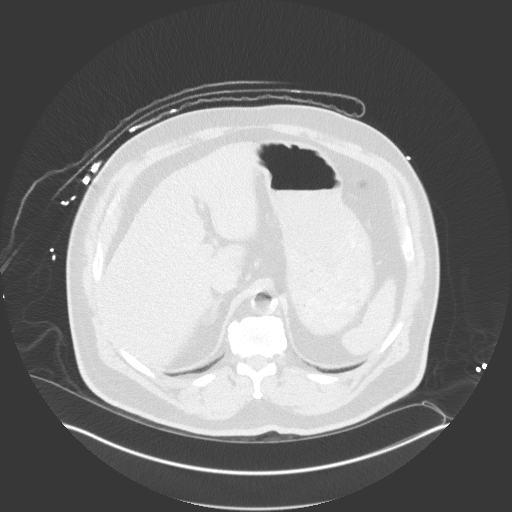
[im 17/68  lung]
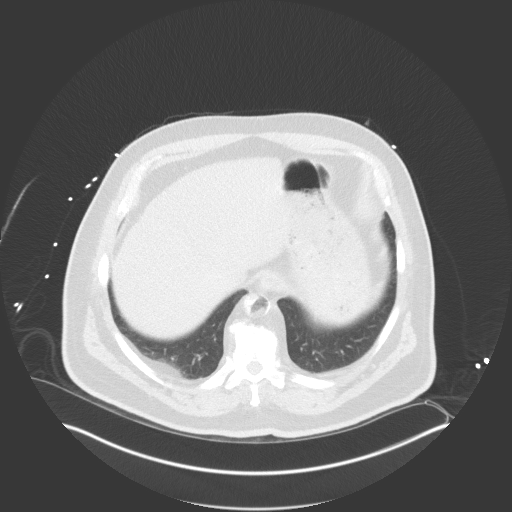
[im 26/68  lung]
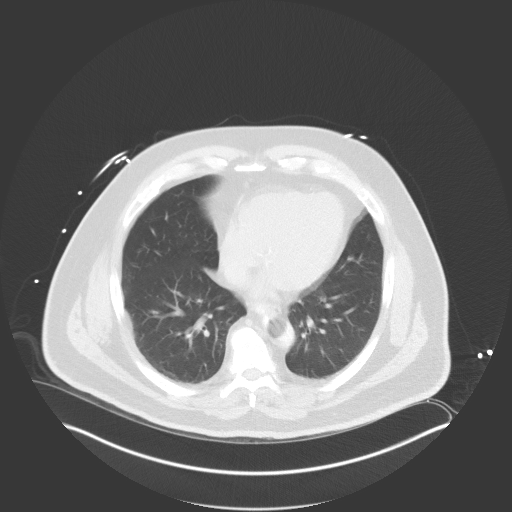
[im 34/68  lung]
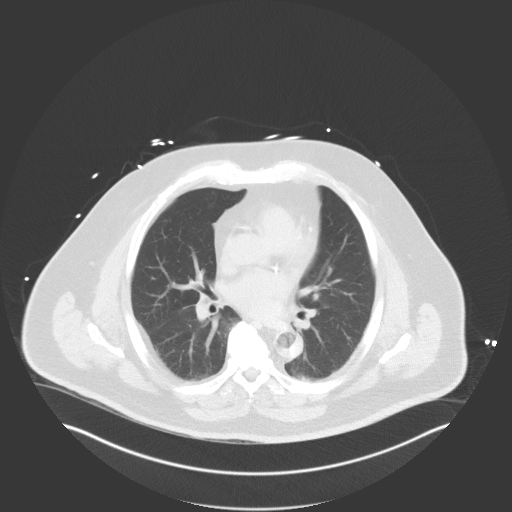
[im 42/68  mediastinal]
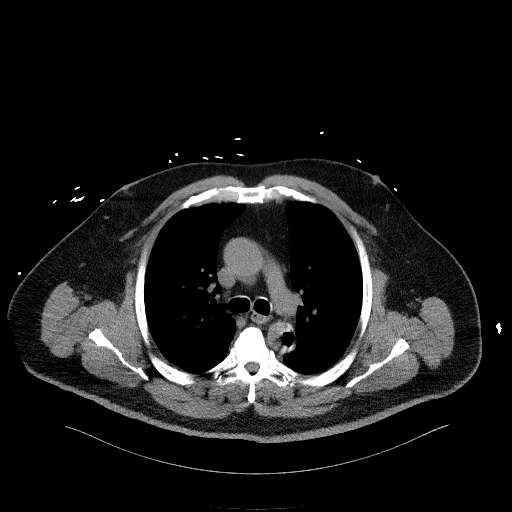
[im 42/68  lung]
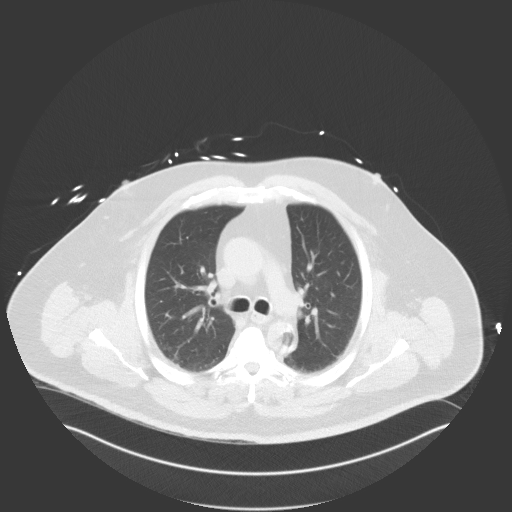
[im 51/68  lung]
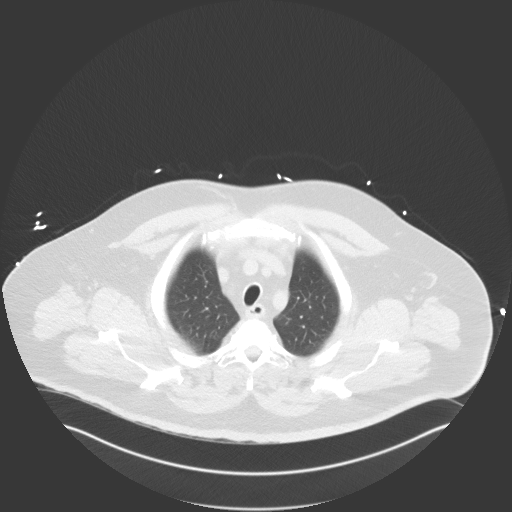
[im 59/68  lung]
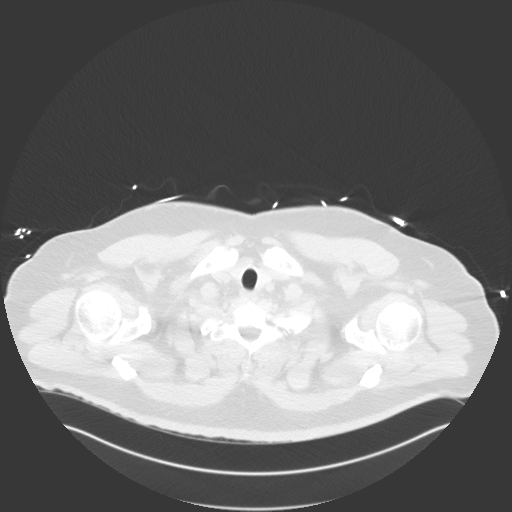

[Series 5: thorax 2.0 · axial · 0.77mm/px · z∈[+1150,+1314]mm · 6 of 149 slices shown]
[im 17/149  lung]
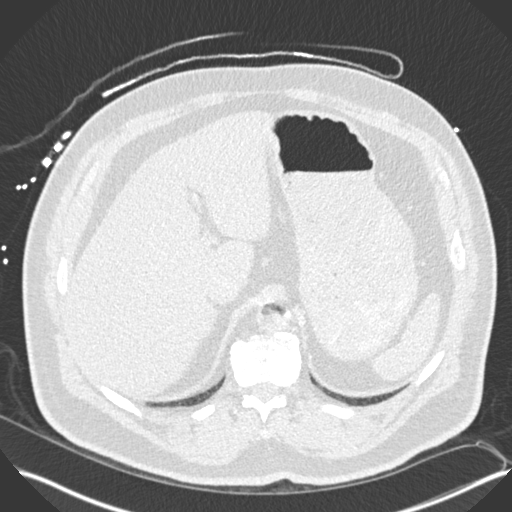
[im 33/149  lung]
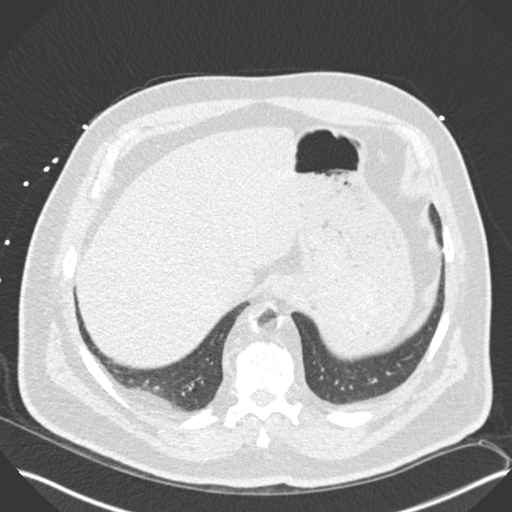
[im 50/149  lung]
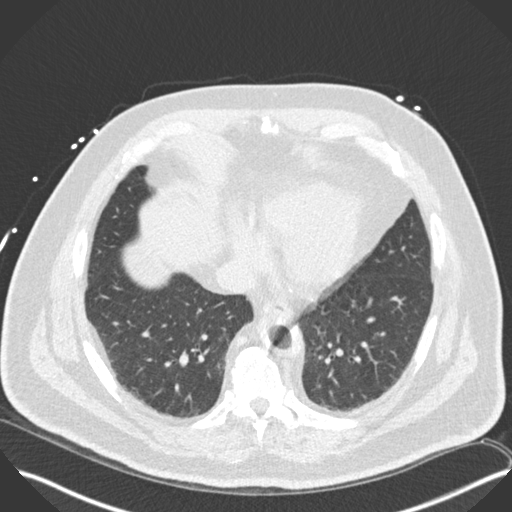
[im 66/149  lung]
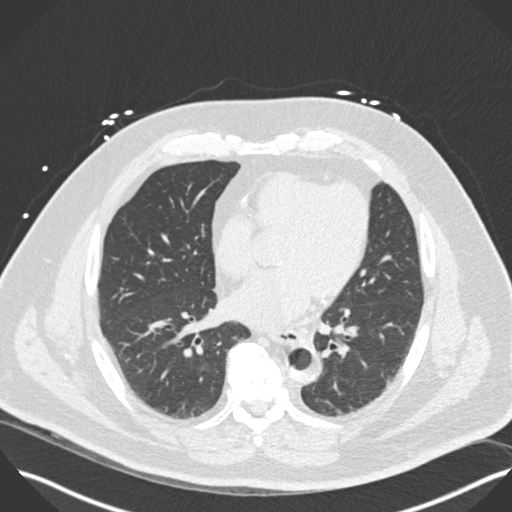
[im 83/149  lung]
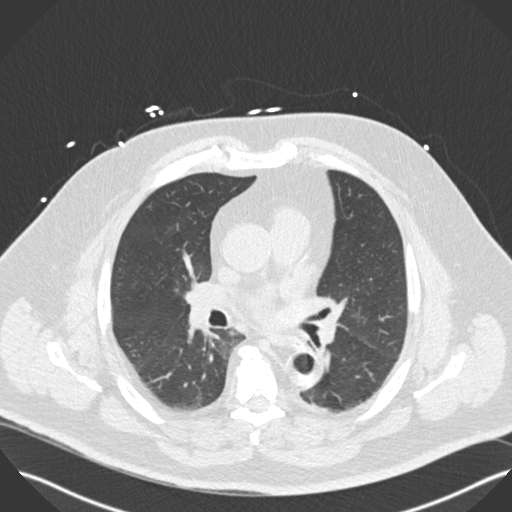
[im 99/149  lung]
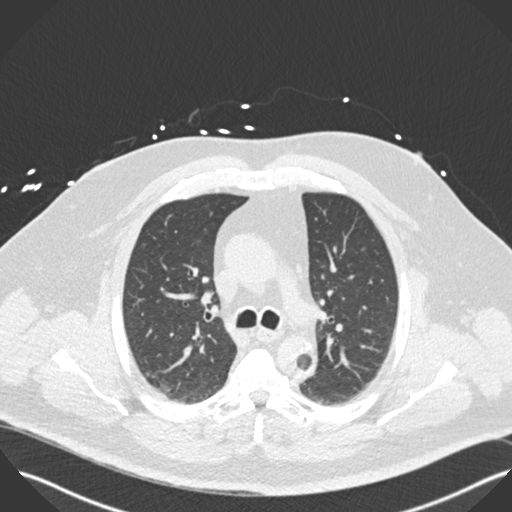

[Series 6: coronal · coronal · 0.72mm/px · 3 of 115 slices shown]
[im 23/115  lung]
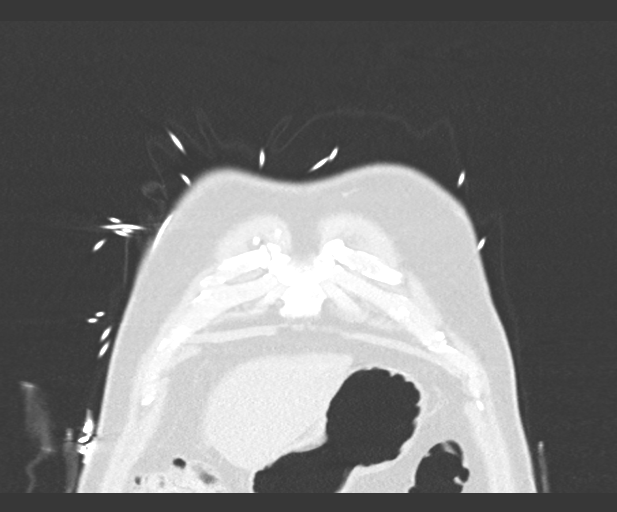
[im 46/115  lung]
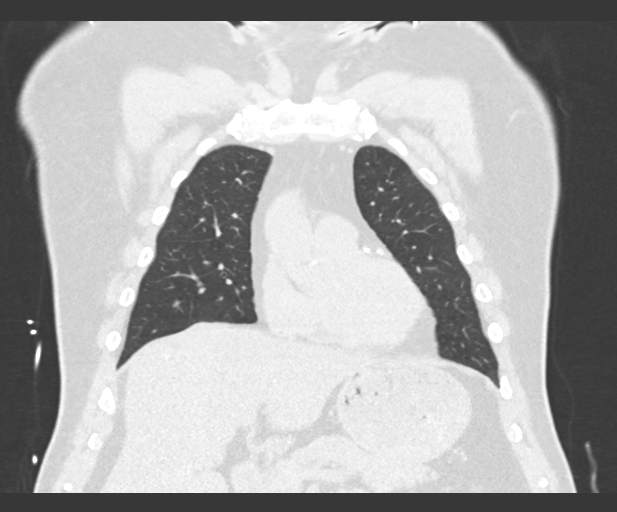
[im 69/115  lung]
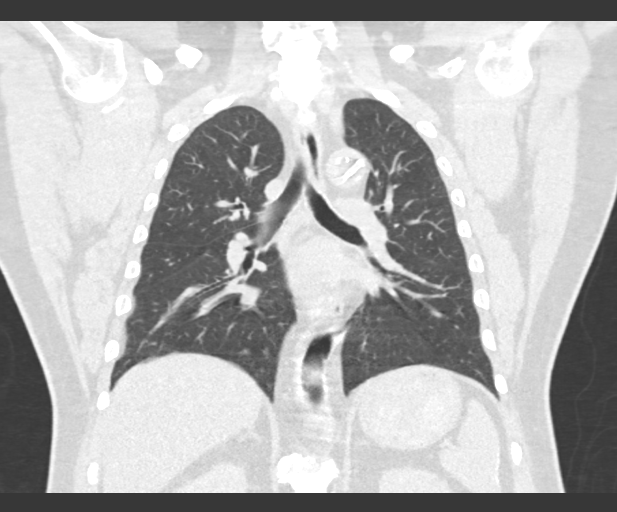

[16 of 36 positions shown; findings below may reference images not displayed]

FINDINGS: Cardiovascular: Scattered aortic atherosclerosis. Intraaortic
balloon pump, tip in the proximal descending thoracic aorta. Normal
heart size. Three-vessel coronary artery calcifications. No
pericardial effusion. The left brachiocephalic vein lies 1.2 cm
posterior to the manubrium. The tubular ascending thoracic aorta
lies 3.5 cm posterior to the sternal body. The right ventricle lies
1.3 cm posterior to the lower sternal body.

Mediastinum/Nodes: No enlarged mediastinal, hilar, or axillary lymph
nodes. Thyroid gland, trachea, and esophagus demonstrate no
significant findings.

Lungs/Pleura: Lungs are clear. No pleural effusion or pneumothorax.

Upper Abdomen: No acute abnormality. Hepatic steatosis.

Musculoskeletal: No chest wall mass or suspicious bone lesions
identified.
IMPRESSION: 1.  No acute airspace abnormality.

2. The left brachiocephalic vein lies 1.2 cm posterior to the
manubrium. The right ventricle lies 1.3 cm posterior to the lower
sternal body. The tubular ascending thoracic aorta lies 3.5 cm
posterior to the sternal body.

3. Intraaortic balloon pump, tip in the proximal descending thoracic
aorta.

4.  Coronary artery disease.  Aortic Atherosclerosis (Z1V0H-B00.0).

5.  Hepatic steatosis.

## 2021-05-05 IMAGING — DX DG CHEST 1V PORT
1 series · 1 of 1 positions shown · non-contrast
Comparison: 09/17/2019; 09/16/2019

CLINICAL DATA: Post CABG.  Chest tube present.

EXAM:
PORTABLE CHEST 1 VIEW

[chest ap]
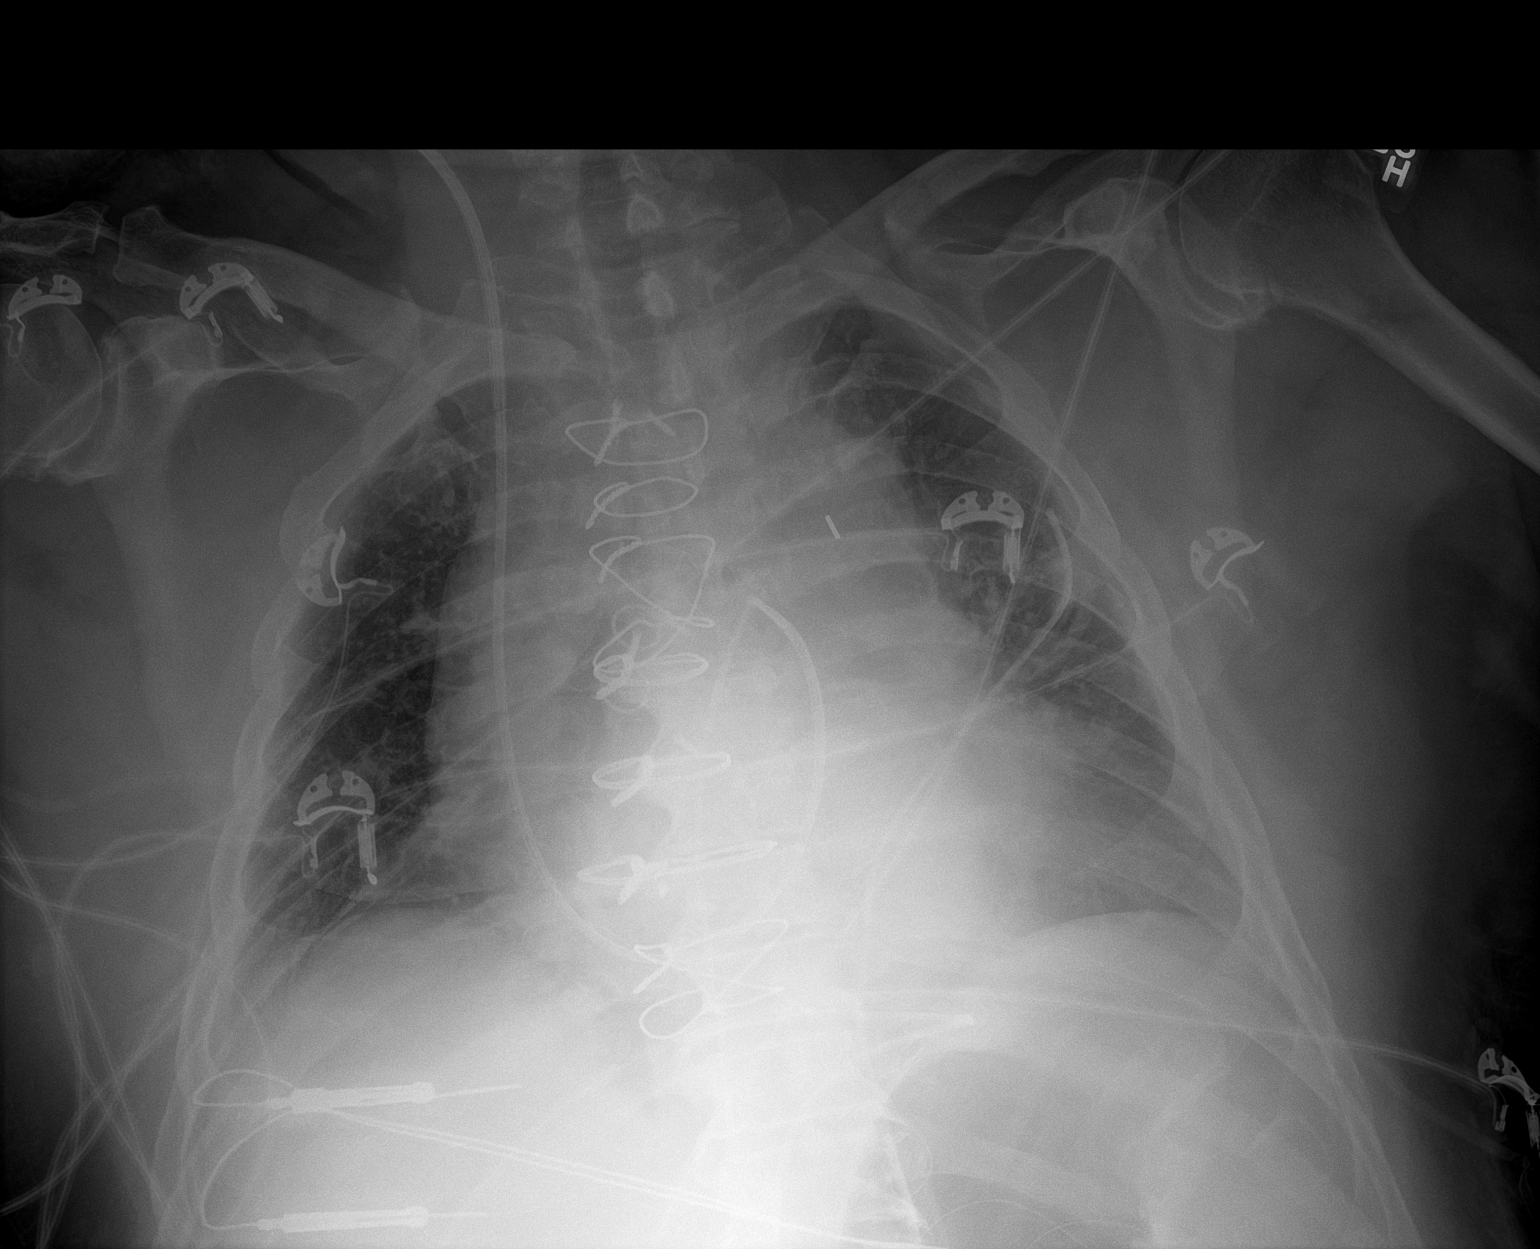

[1 of 1 positions shown; findings below may reference images not displayed]

FINDINGS: Grossly unchanged enlarged cardiac silhouette and mediastinal
contours post median sternotomy and CABG given persistently reduced
lung volumes.

Interval extubation removal of enteric tube. Otherwise, stable
position of remaining support apparatus including tip of
intra-aortic balloon pump approximately 2.2 cm below the aortic
arch. No pneumothorax.

Worsening perihilar opacities. No definite pleural effusion. No
acute osseous abnormalities.
IMPRESSION: 1. Interval extubation and removal of enteric tube. Otherwise,
stable positioning of remaining support apparatus. No pneumothorax.
2. Persistently reduced lung volumes with worsening perihilar
opacities, atelectasis versus edema.

## 2021-05-06 IMAGING — DX DG CHEST 1V
1 series · 1 of 1 positions shown · non-contrast
Comparison: September 18, 2019.

CLINICAL DATA: Chest tube placement.

EXAM:
CHEST  1 VIEW

[chest]
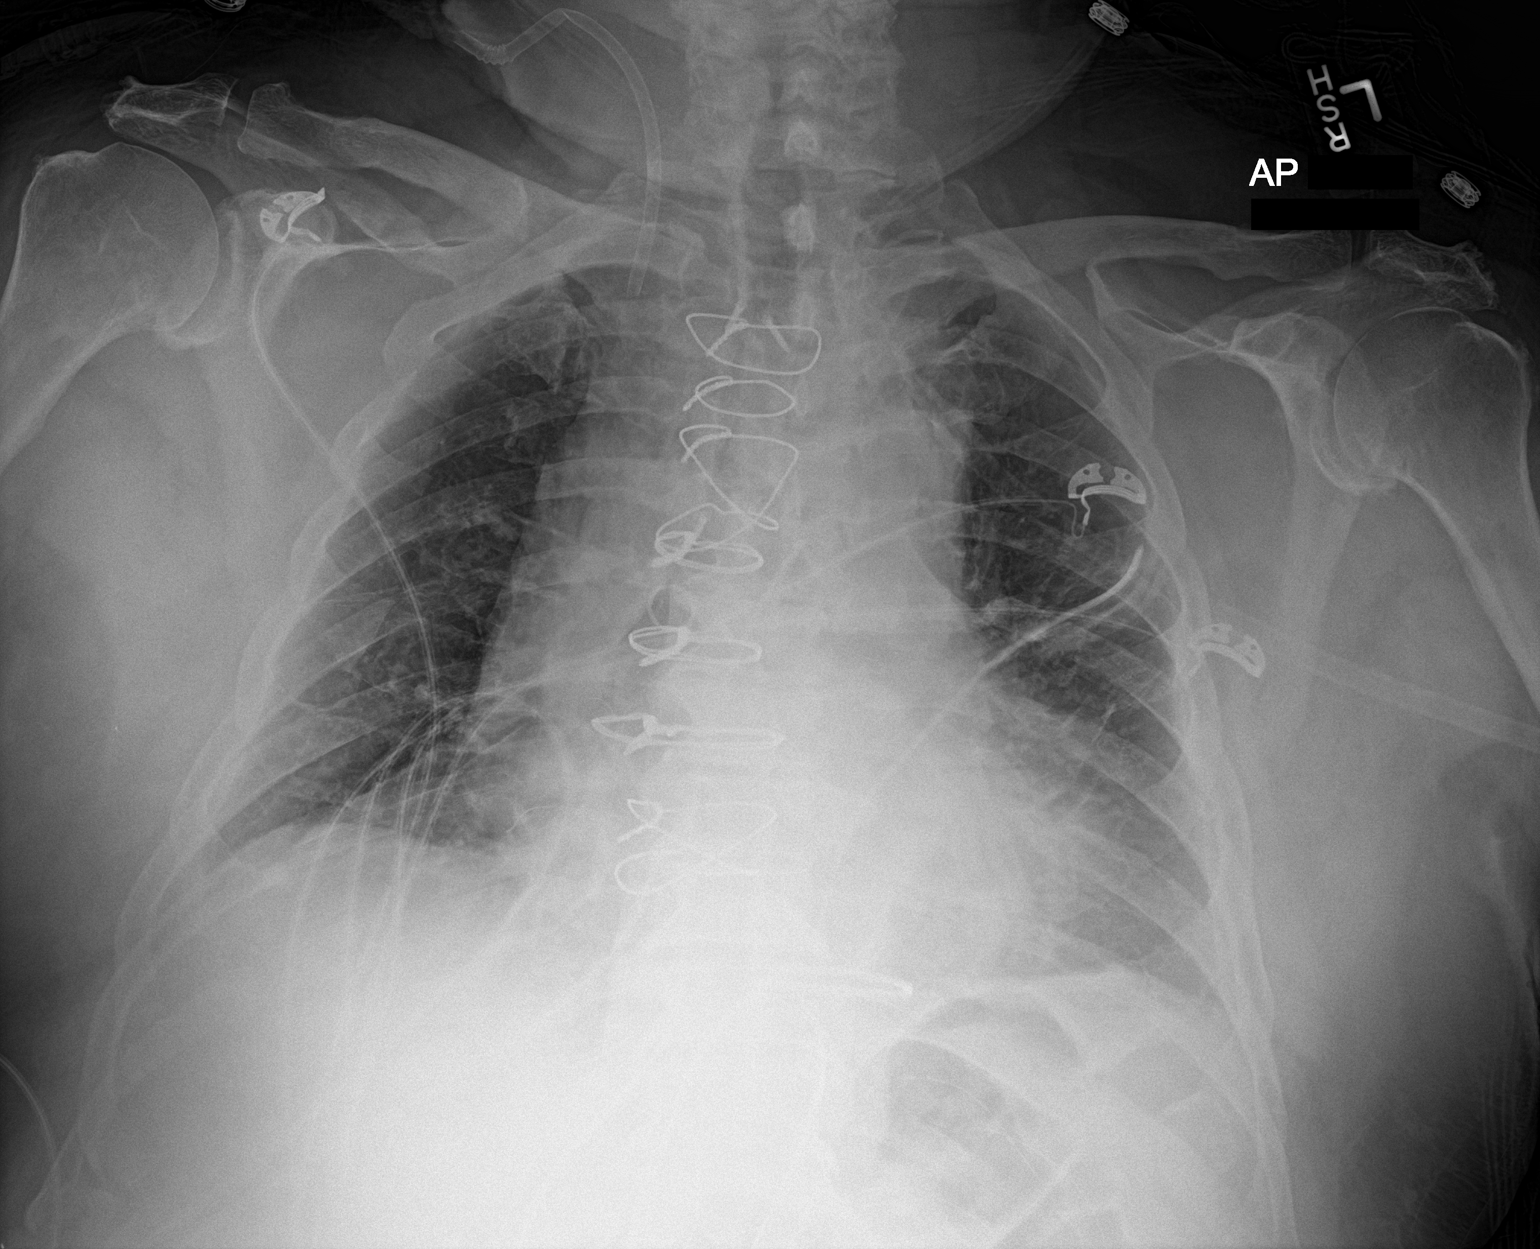

[1 of 1 positions shown; findings below may reference images not displayed]

FINDINGS: Stable cardiomegaly. Status post coronary bypass graft. Swan-Ganz
catheter has been removed. Right lung is clear. Stable position of
left-sided chest tube is noted without pneumothorax. Bony thorax is
unremarkable.
IMPRESSION: Stable position of left-sided chest tube without pneumothorax.

## 2021-05-08 IMAGING — CR DG CHEST 2V
2 series · 2 of 2 positions shown · non-contrast
Comparison: 09/19/2019

CLINICAL DATA: History of recent cardiac surgery

EXAM:
CHEST - 2 VIEW

[chest pa]
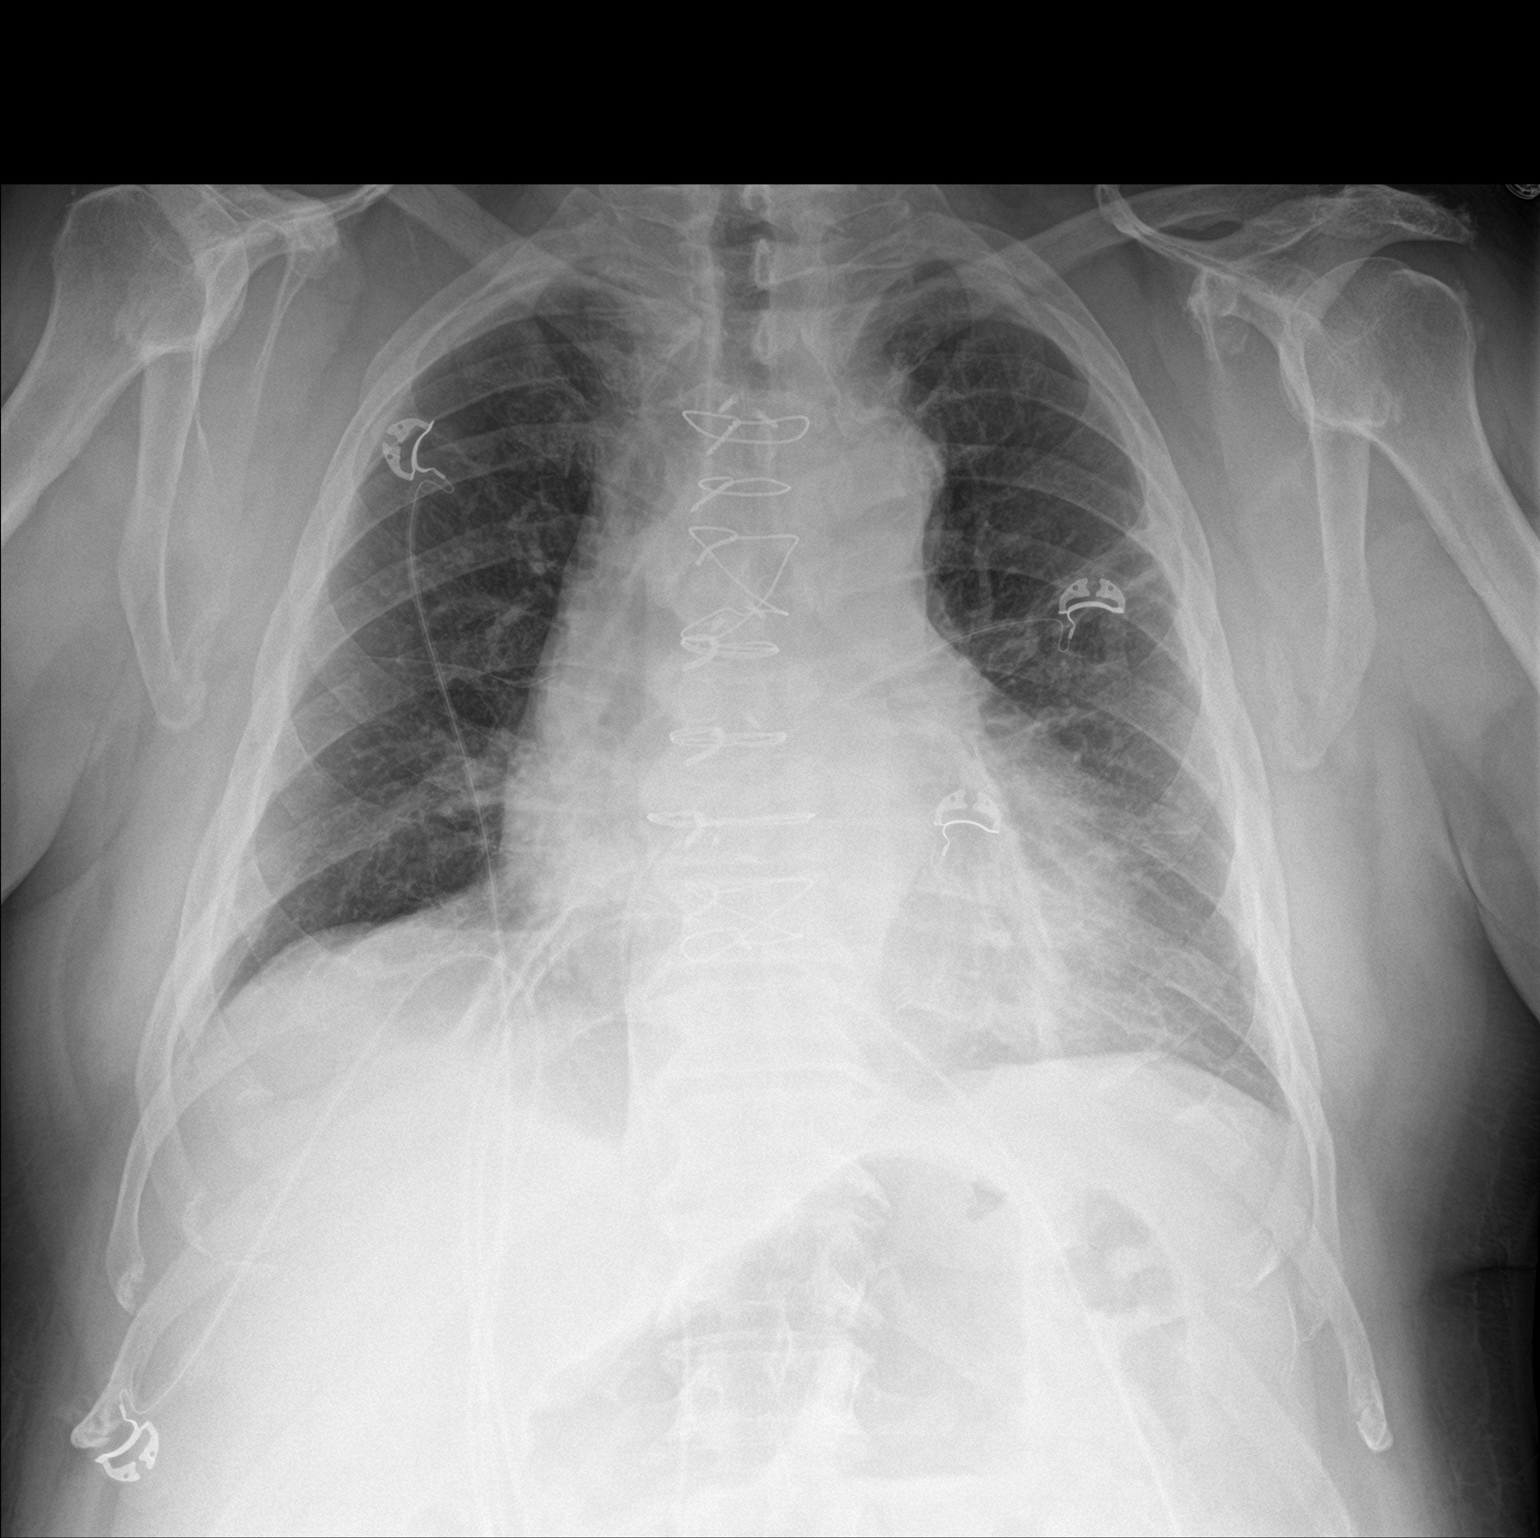

[chest lat]
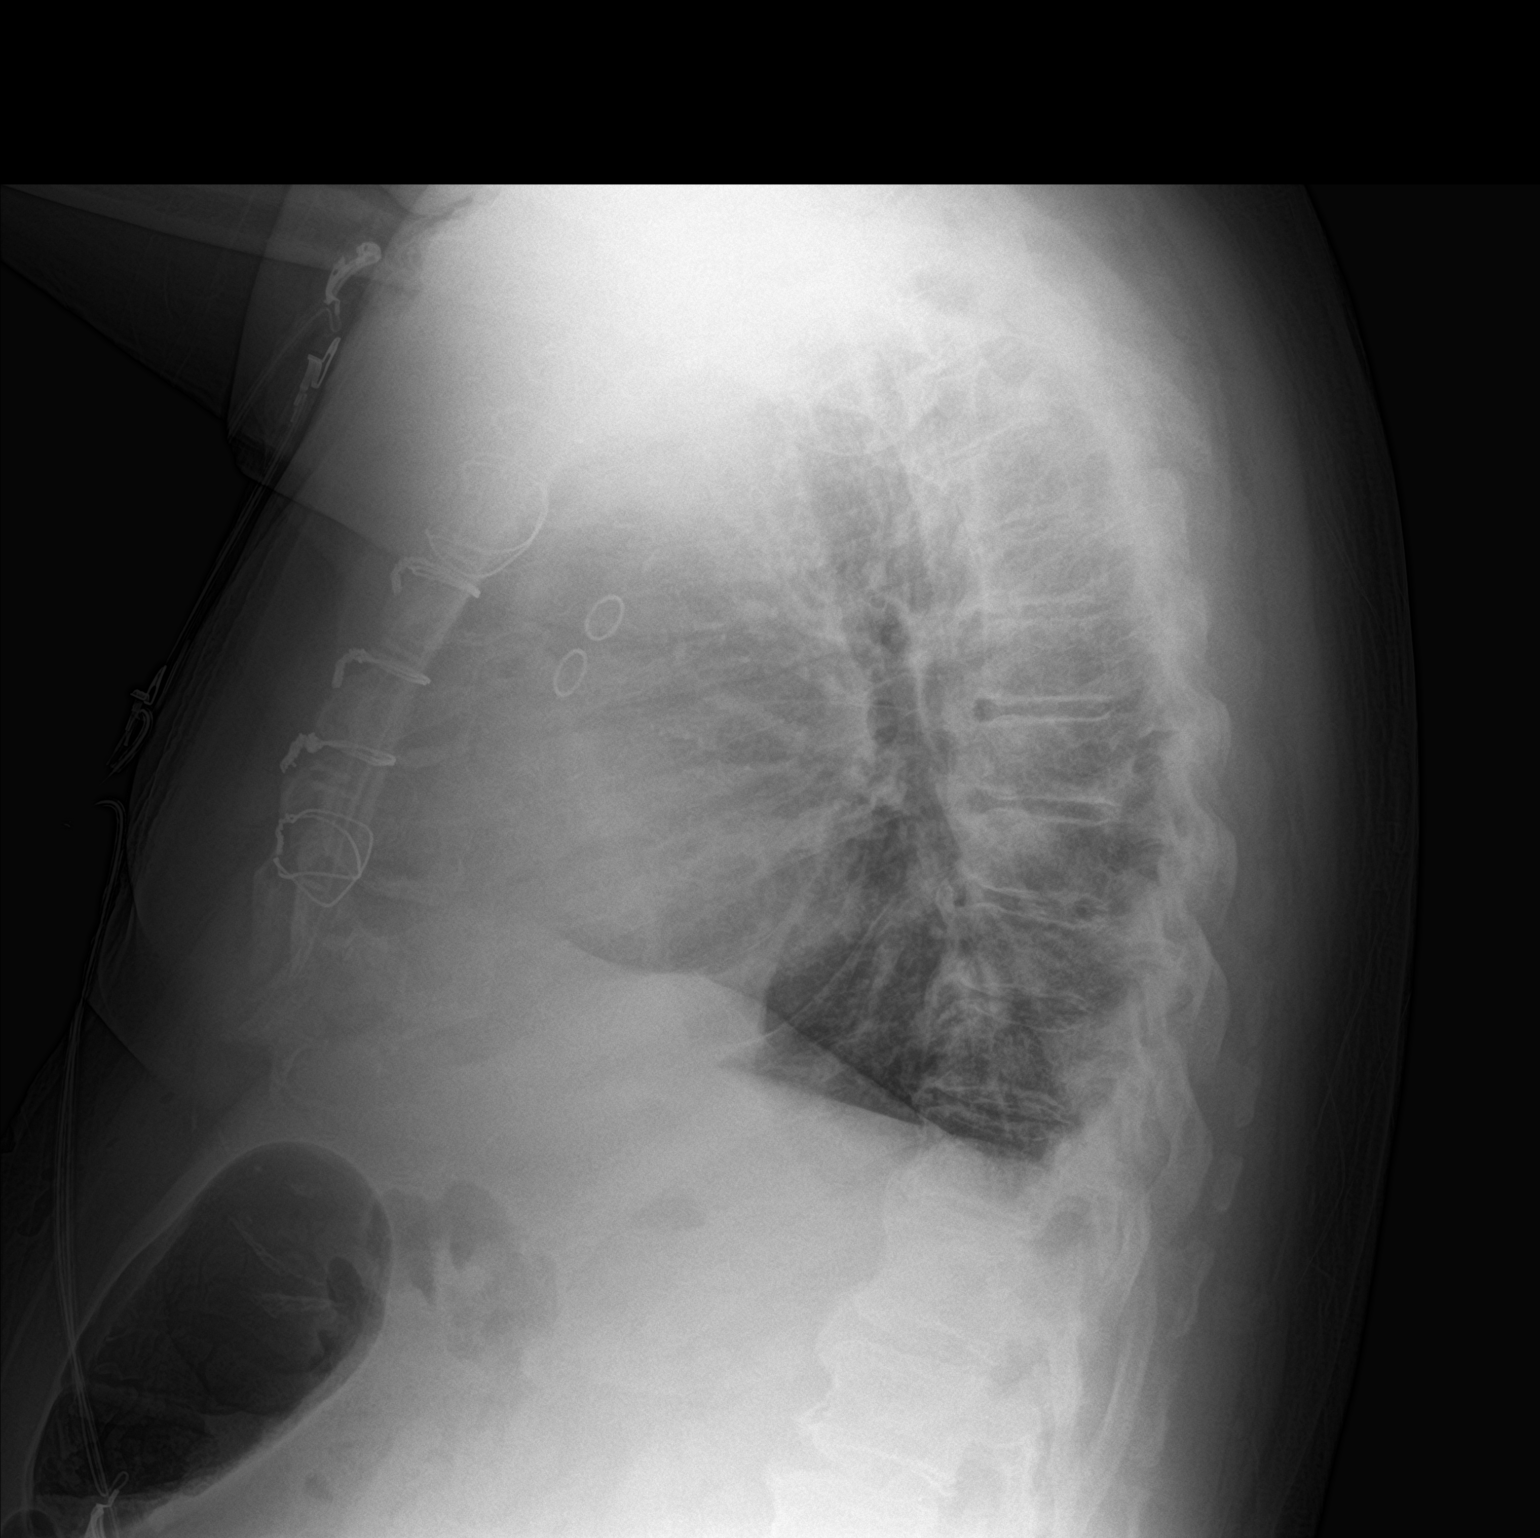

[2 of 2 positions shown; findings below may reference images not displayed]

FINDINGS: Right jugular sheath is been removed in the interval. Cardiac shadow
remains mildly enlarged. Postsurgical changes are again seen. Mild
hyperinflation of the lungs is noted. No focal infiltrate or sizable
effusion is seen. No acute bony abnormality is noted. Previously
seen left chest tube has been removed without recurrent
pneumothorax.
IMPRESSION: No pneumothorax following chest tube removal.

No acute abnormality is noted.

## 2022-03-18 ENCOUNTER — Telehealth: Payer: Self-pay | Admitting: Cardiovascular Disease

## 2022-03-18 NOTE — Telephone Encounter (Signed)
Declined recall [facility]   Deleted recall

## 2023-02-05 ENCOUNTER — Emergency Department
Admission: EM | Admit: 2023-02-05 | Discharge: 2023-02-05 | Disposition: A | Discharge status: Home / Self Care | Payer: No Typology Code available for payment source | Attending: Emergency Medicine | Admitting: Emergency Medicine

## 2023-02-05 ENCOUNTER — Other Ambulatory Visit: Payer: Self-pay

## 2023-02-05 ENCOUNTER — Emergency Department: Payer: No Typology Code available for payment source

## 2023-02-05 DIAGNOSIS — R079 Chest pain, unspecified: Secondary | ICD-10-CM | POA: Diagnosis present

## 2023-02-05 DIAGNOSIS — J069 Acute upper respiratory infection, unspecified: Secondary | ICD-10-CM | POA: Insufficient documentation

## 2023-02-05 DIAGNOSIS — Z951 Presence of aortocoronary bypass graft: Secondary | ICD-10-CM | POA: Insufficient documentation

## 2023-02-05 DIAGNOSIS — Z1152 Encounter for screening for COVID-19: Secondary | ICD-10-CM | POA: Insufficient documentation

## 2023-02-05 DIAGNOSIS — E119 Type 2 diabetes mellitus without complications: Secondary | ICD-10-CM | POA: Insufficient documentation

## 2023-02-05 DIAGNOSIS — I2510 Atherosclerotic heart disease of native coronary artery without angina pectoris: Secondary | ICD-10-CM | POA: Diagnosis not present

## 2023-02-05 DIAGNOSIS — R509 Fever, unspecified: Secondary | ICD-10-CM | POA: Insufficient documentation

## 2023-02-05 DIAGNOSIS — R0789 Other chest pain: Secondary | ICD-10-CM | POA: Insufficient documentation

## 2023-02-05 DIAGNOSIS — Z87891 Personal history of nicotine dependence: Secondary | ICD-10-CM | POA: Insufficient documentation

## 2023-02-05 DIAGNOSIS — I10 Essential (primary) hypertension: Secondary | ICD-10-CM | POA: Diagnosis not present

## 2023-02-05 LAB — BASIC METABOLIC PANEL
Anion gap: 11 (ref 5–15)
BUN: 17 mg/dL (ref 8–23)
CO2: 22 mmol/L (ref 22–32)
Calcium: 8.5 mg/dL — ABNORMAL LOW (ref 8.9–10.3)
Chloride: 101 mmol/L (ref 98–111)
Creatinine, Ser: 1.1 mg/dL (ref 0.61–1.24)
GFR, Estimated: >60 mL/min (ref >60)
Glucose, Bld: 97 mg/dL (ref 70–99)
Potassium: 4 mmol/L (ref 3.5–5.1)
Sodium: 134 mmol/L — ABNORMAL LOW (ref 135–145)

## 2023-02-05 LAB — CBC
HCT: 45.7 % (ref 39.0–52.0)
Hemoglobin: 15.1 g/dL (ref 13.0–17.0)
MCH: 28 pg (ref 26.0–34.0)
MCHC: 33 g/dL (ref 30.0–36.0)
MCV: 84.8 fL (ref 80.0–100.0)
Platelets: 203 10*3/uL (ref 150–400)
RBC: 5.39 MIL/uL (ref 4.22–5.81)
RDW: 14.5 % (ref 11.5–15.5)
WBC: 7 10*3/uL (ref 4.0–10.5)
nRBC: 0 % (ref 0.0–0.2)

## 2023-02-05 LAB — TROPONIN I (HIGH SENSITIVITY)
Troponin I (High Sensitivity): 52 ng/L — ABNORMAL HIGH (ref <18)
Troponin I (High Sensitivity): 51 ng/L — ABNORMAL HIGH (ref <18)

## 2023-02-05 LAB — SARS CORONAVIRUS 2 BY RT PCR: SARS Coronavirus 2 by RT PCR: NEGATIVE

## 2023-02-05 MED ORDER — ASPIRIN 81 MG PO CHEW
324.0000 mg | CHEWABLE_TABLET | Freq: Once | ORAL | Status: AC
  Administered 2023-02-05: 324 mg via ORAL
  Filled 2023-02-05: qty 4

## 2023-02-05 MED ORDER — ONDANSETRON 4 MG PO TBDP
4.0000 mg | ORAL_TABLET | Freq: Once | ORAL | Status: AC
  Administered 2023-02-05: 4 mg via ORAL
  Filled 2023-02-05: qty 1

## 2023-02-05 MED ORDER — ACETAMINOPHEN 500 MG PO TABS
1000.0000 mg | ORAL_TABLET | Freq: Once | ORAL | Status: AC
  Administered 2023-02-05: 1000 mg via ORAL
  Filled 2023-02-05: qty 2

## 2023-02-05 NOTE — ED Triage Notes (Signed)
Pt presents to ED with CP and weakness for the past few days. Pt states cardiac HX of quadruple bypass 3 years ago. Pt endorses productive cough with green sputum.

## 2023-02-05 NOTE — ED Provider Notes (Signed)
[facility] Provider Note    Event Date/Time   First MD Initiated Contact with Patient 02/05/23 1140     (approximate)   History   Chest Pain and Weakness   HPI  Brian Chapman is a 77 y.o. male   Past medical history of CAD with CABG 3 years ago, former smoker without COPD or asthma, diabetes, hypertension, hyperlipidemia OSA on CPAP presents emergency department with 3 days of productive cough, nasal congestion, sore throat, body aches as well as chest pain.  Nausea and poor p.o. intake.  Low-grade fever.  Denies abdominal pain, GI bleeding, urinary symptoms.  No known sick contacts.  Chest pain is described as a sharp intermittent sensation, no radiation, nonexertional.   External Medical Documents Reviewed: Cardiology note from March 2022 Dr. Kirke Corin documenting his past cardiac history and February 2021 myocardial infarction with CABG      Physical Exam   Triage Vital Signs: ED Triage Vitals  Enc Vitals Group     BP 02/05/23 1124 138/88     Pulse Rate 02/05/23 1124 66     Resp 02/05/23 1124 20     Temp 02/05/23 1124 98.6 F (37 C)     Temp Source 02/05/23 1124 Oral     SpO2 --      Weight --      Height --      Head Circumference --      Peak Flow --      Pain Score 02/05/23 1121 4     Pain Loc --      Pain Edu? --      Excl. in GC? --     Most recent vital signs: Vitals:   02/05/23 1124  BP: 138/88  Pulse: 66  Resp: 20  Temp: 98.6 F (37 C)    General: Awake, no distress.  CV:  Good peripheral perfusion.  Resp:  Normal effort.  Abd:  No distention.  Other:  Awake alert comfortable appearing in no acute distress, afebrile, respirations even and unlabored, lungs clear to auscultation without wheezing or focality.  Skin appears warm well-perfused, no edema noted.  Peripheral pulses intact radial bilaterally equal.  Soft nontender abdomen.  Posterior oropharynx slight erythema without exudates or masses.  Hoarse voice.   Neck supple with full range of motion, no meningismus.   ED Results / Procedures / Treatments   Labs (all labs ordered are listed, but only abnormal results are displayed) Labs Reviewed  BASIC METABOLIC PANEL - Abnormal; Notable for the following components:      Result Value   Sodium 134 (*)    Calcium 8.5 (*)    All other components within normal limits  TROPONIN I (HIGH SENSITIVITY) - Abnormal; Notable for the following components:   Troponin I (High Sensitivity) 52 (*)    All other components within normal limits  TROPONIN I (HIGH SENSITIVITY) - Abnormal; Notable for the following components:   Troponin I (High Sensitivity) 51 (*)    All other components within normal limits  SARS CORONAVIRUS 2 BY RT PCR  CBC     I ordered and reviewed the above labs they are notable for initial troponin is 52  EKG  ED ECG REPORT I, Pilar Jarvis, the attending physician, personally viewed and interpreted this ECG.   Date: 02/05/2023  EKG Time: 1122  Rate: 72  Rhythm: sinus   Axis: nl  Intervals:none  ST&T Change: no stemi    RADIOLOGY I  independently reviewed and interpreted chest x-ray and see no obvious focalities or pneumothorax   PROCEDURES:  Critical Care performed: No  Procedures   MEDICATIONS ORDERED IN ED: Medications  ondansetron (ZOFRAN-ODT) disintegrating tablet 4 mg (4 mg Oral Given 02/05/23 1219)  acetaminophen (TYLENOL) tablet 1,000 mg (1,000 mg Oral Given 02/05/23 1219)  aspirin chewable tablet 324 mg (324 mg Oral Given 02/05/23 1219)    IMPRESSION / MDM / ASSESSMENT AND PLAN / ED COURSE  I reviewed the triage vital signs and the nursing notes.                                Patient's presentation is most consistent with acute presentation with potential threat to life or bodily function.  Differential diagnosis includes, but is not limited to, viral upper respiratory tract infection, bacterial pneumonia, ACS, PE, dissection   The patient is on the  cardiac monitor to evaluate for evidence of arrhythmia and/or significant heart rate changes.  MDM:    This patient with acute bronchitis 3 days of viral upper respiratory tract infectious symptoms.  No history of COPD or asthma and his lungs have no wheezing.  He is not in respiratory distress.  He has associated chest pain and nausea.  His soft nontender abdomen rules against surgical abdominal pathologies.  I considered bacterial pneumonia but white blood cell count febrile, no focalities on exam and a clear chest x-ray rules against this diagnosis, will defer antibiotics at this time.  His EKG is nonischemic.  His initial troponin is 52, no recent baseline to compare.  Given his history of CAD with CABG, will need a serial troponin to rule out NSTEMI. No CP currently. ASA ordered.    Troponins have been flat.  Chest pain-free.  Plan is for discharge with close PMD follow-up, strict return precautions given for worsening chest pain, worsening resp infx sx.       FINAL CLINICAL IMPRESSION(S) / ED DIAGNOSES   Final diagnoses:  Viral URI  Nonspecific chest pain     Rx / DC Orders   ED Discharge Orders     None        Note:  This document was prepared using Dragon voice recognition software and may include unintentional dictation errors.    Pilar Jarvis, MD 02/05/23 [phone]

## 2023-02-05 NOTE — Discharge Instructions (Addendum)
Take tylenol 650 mg every 6 hours for aches and pains.  Fortunately your testing in the emergency department showed no signs of emergencies like heart attack, or bacterial pneumonia requiring antibiotics at this time.  If you experience any new, worsening, or unexpected symptoms including but not limited to new or worsening chest pain, worsening cough or fever, or any other concerning symptoms call your doctor right away or come back to the emergency department for reevaluation.
